# Patient Record
Sex: Male | Born: 1990 | Race: Black or African American | Hispanic: No | Marital: Single | Smoking: Current every day smoker
Health system: Southern US, Community
[De-identification: ages and names within clinical notes are randomized; demographics above are authoritative.]

## PROBLEM LIST (undated history)

## (undated) DIAGNOSIS — F209 Schizophrenia, unspecified: Secondary | ICD-10-CM

## (undated) DIAGNOSIS — F419 Anxiety disorder, unspecified: Secondary | ICD-10-CM

## (undated) DIAGNOSIS — F39 Unspecified mood [affective] disorder: Secondary | ICD-10-CM

---
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
## 2011-09-18 ENCOUNTER — Emergency Department (HOSPITAL_COMMUNITY): Payer: Self-pay

## 2011-09-18 ENCOUNTER — Emergency Department (HOSPITAL_COMMUNITY)
Admission: EM | Admit: 2011-09-18 | Discharge: 2011-09-22 | Disposition: A | Discharge status: Other Acute Inpatient Hospital | Payer: Self-pay

## 2011-09-18 ENCOUNTER — Encounter (HOSPITAL_COMMUNITY): Payer: Self-pay | Admitting: Family Medicine

## 2011-09-18 DIAGNOSIS — F29 Unspecified psychosis not due to a substance or known physiological condition: Secondary | ICD-10-CM | POA: Insufficient documentation

## 2011-09-18 DIAGNOSIS — IMO0002 Reserved for concepts with insufficient information to code with codable children: Secondary | ICD-10-CM | POA: Insufficient documentation

## 2011-09-18 LAB — RAPID URINE DRUG SCREEN, HOSP PERFORMED
Amphetamines: NOT DETECTED
Barbiturates: NOT DETECTED
Cocaine: NOT DETECTED
Tetrahydrocannabinol: POSITIVE — AB

## 2011-09-18 LAB — URINALYSIS, ROUTINE W REFLEX MICROSCOPIC
Glucose, UA: NEGATIVE mg/dL
Hgb urine dipstick: NEGATIVE
Ketones, ur: 40 mg/dL — AB
Nitrite: NEGATIVE
Protein, ur: 100 mg/dL — AB
Specific Gravity, Urine: 1.036 — ABNORMAL HIGH (ref 1.005–1.030)
Urobilinogen, UA: 1 mg/dL (ref 0.0–1.0)

## 2011-09-18 LAB — URINE MICROSCOPIC-ADD ON

## 2011-09-18 LAB — COMPREHENSIVE METABOLIC PANEL
AST: 52 U/L — ABNORMAL HIGH (ref 0–37)
Creatinine, Ser: 1.09 mg/dL (ref 0.50–1.35)
GFR calc non Af Amer: >90 mL/min (ref >90)
Glucose, Bld: 94 mg/dL (ref 70–99)

## 2011-09-18 LAB — CBC
MCH: 28.6 pg (ref 26.0–34.0)
RBC: 5.21 MIL/uL (ref 4.22–5.81)

## 2011-09-18 LAB — ACETAMINOPHEN LEVEL: Acetaminophen (Tylenol), Serum: <15 ug/mL (ref 10–30)

## 2011-09-18 MED ORDER — ZOLPIDEM TARTRATE 5 MG PO TABS: 5.0000 mg | ORAL_TABLET | Freq: Every evening | ORAL | Status: DC | PRN

## 2011-09-18 MED ORDER — ONDANSETRON HCL 4 MG PO TABS
4.0000 mg | ORAL_TABLET | Freq: Three times a day (TID) | ORAL | Status: DC | PRN
  Administered 2011-09-21: 4 mg via ORAL

## 2011-09-18 MED ORDER — IBUPROFEN 600 MG PO TABS: 600.0000 mg | ORAL_TABLET | Freq: Three times a day (TID) | ORAL | Status: DC | PRN

## 2011-09-18 MED ORDER — ACETAMINOPHEN 325 MG PO TABS: 650.0000 mg | ORAL_TABLET | ORAL | Status: DC | PRN

## 2011-09-18 MED ORDER — ALUM & MAG HYDROXIDE-SIMETH 200-200-20 MG/5ML PO SUSP: 30.0000 mL | ORAL | Status: DC | PRN

## 2011-09-18 MED ORDER — LORAZEPAM 1 MG PO TABS
1.0000 mg | ORAL_TABLET | Freq: Three times a day (TID) | ORAL | Status: DC | PRN
  Administered 2011-09-20 – 2011-09-22 (×3): 1 mg via ORAL
  Filled 2011-09-18 (×4): qty 1

## 2011-09-18 NOTE — ED Notes (Addendum)
Pt has shirt, pants, shoes,socks, wallet, bracelets, watch and some coins.   Security searched pt and belongings.  Placed in cabinet 2 triage.
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
## 2011-09-18 NOTE — ED Provider Notes (Signed)
Pt delusional . Speaking in nonsensical run on sentences. Under IVC .  Told others he was God and Mariana Kaufman the [place],. To obstain telepschy consult Pt felt to be having 1st psychotic break  Doug Sou, MD 09/18/11 2354

## 2011-09-18 NOTE — ED Notes (Signed)
Wolfe, PA at bedside.
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
## 2011-09-18 NOTE — ED Notes (Signed)
MD at bedside. 

## 2011-09-19 ENCOUNTER — Inpatient Hospital Stay (HOSPITAL_COMMUNITY): Admit: 2011-09-19 | Payer: Self-pay | Source: Other Acute Inpatient Hospital | Admitting: Pulmonary Disease

## 2011-09-19 LAB — COMPREHENSIVE METABOLIC PANEL
Albumin: 4 g/dL (ref 3.5–5.2)
Alkaline Phosphatase: 53 U/L (ref 39–117)
Calcium: 9 mg/dL (ref 8.4–10.5)
Chloride: 103 mEq/L (ref 96–112)
GFR calc Af Amer: >90 mL/min (ref >90)
GFR calc non Af Amer: >90 mL/min (ref >90)
Glucose, Bld: 94 mg/dL (ref 70–99)
Sodium: 137 mEq/L (ref 135–145)
Total Bilirubin: 1.5 mg/dL — ABNORMAL HIGH (ref 0.3–1.2)
Total Protein: 6.5 g/dL (ref 6.0–8.3)

## 2011-09-19 MED ORDER — ZIPRASIDONE MESYLATE 20 MG IM SOLR
INTRAMUSCULAR | Status: AC
  Administered 2011-09-19: 20 mg via INTRAMUSCULAR
  Filled 2011-09-19: qty 20

## 2011-09-19 MED ORDER — ACETAMINOPHEN 325 MG PO TABS: 650.0000 mg | ORAL_TABLET | ORAL | Status: DC | PRN

## 2011-09-19 MED ORDER — LORAZEPAM 1 MG PO TABS
1.0000 mg | ORAL_TABLET | Freq: Three times a day (TID) | ORAL | Status: DC | PRN
  Administered 2011-09-19 – 2011-09-21 (×2): 1 mg via ORAL
  Filled 2011-09-19: qty 1

## 2011-09-19 MED ORDER — SODIUM CHLORIDE 0.9 % IV BOLUS (SEPSIS)
1000.0000 mL | Freq: Once | INTRAVENOUS | Status: AC
  Administered 2011-09-19: 1000 mL via INTRAVENOUS

## 2011-09-19 MED ORDER — ONDANSETRON HCL 4 MG PO TABS
4.0000 mg | ORAL_TABLET | Freq: Three times a day (TID) | ORAL | Status: DC | PRN
  Filled 2011-09-19: qty 1

## 2011-09-19 MED ORDER — IBUPROFEN 600 MG PO TABS: 600.0000 mg | ORAL_TABLET | Freq: Three times a day (TID) | ORAL | Status: DC | PRN

## 2011-09-19 NOTE — ED Notes (Signed)
Obtain labs after pt has completed bolus per MD
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
## 2011-09-19 NOTE — ED Notes (Signed)
Patient ready to be transferred to [facility].  BH unable to take patient due to staffing. Patient resting quietly in bed with handcuffs on. Skin assessment ok.  Pulses good.  V/S stable.

## 2011-09-19 NOTE — ED Notes (Signed)
Remains in bed asleep at this time. No distress of any sort noted.

## 2011-09-19 NOTE — ED Notes (Signed)
Hacinta (pt. Sister) : [phone]

## 2011-09-19 NOTE — ED Provider Notes (Signed)
9:57 AM The patient became agitated in the emergency department and consulted at [facility].  He is in handcuffs at the bedside.  The rales believes that he is too dangerous for that are 400 hall.  I last her until psychiatry consultation for medication recommendations in this young 21 year old gentleman who has no prior psychiatric history he had per report has delusions.  We spoke with [facility] who is reluctant to take the patient and comes because of his significant threat to others.   Lyanne Co, MD 09/19/11 [phone]

## 2011-09-19 NOTE — ED Notes (Signed)
EDPA Judeth Cornfield called requested that labs be drawn after bolus of fluids. Spoke with Dois Davenport RN CN to move pt to the ED for IV fluids will call back when she has a bed

## 2011-09-19 NOTE — ED Notes (Signed)
Pt accompanied by police to bedside from psych ED

## 2011-09-19 NOTE — ED Notes (Signed)
Pt verbalized to Clinical research associate that he desires to have vistors at this time. Pt informed Clinical research associate that his sister may visit him later. Writer telephoned pt's sister Azalee Course to inform her of pt's request. Pt's sister stated that she would come by to visit pt later this evening.
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
## 2011-09-19 NOTE — ED Notes (Addendum)
Per [place], Pt has been accepted to [facility] to Dr. Allena Katz, Rm 400-1. EDP has been notified and agrees with plan.

## 2011-09-19 NOTE — ED Notes (Signed)
Resting quietly in bed watching television. Remains in handcuffs at this time. Capillary refill wnl.

## 2011-09-19 NOTE — ED Notes (Signed)
Pt present to tcu room 31.  Sitter at bedside.  Pt states ";ies caused me to flip out, my baby moma lied to me, she sleeping around"  Pt denies SI and HI at this time

## 2011-09-19 NOTE — ED Notes (Addendum)
Tele psych consult faxed and called into [facility] --re faxed information at [phone] per EDP [facility]

## 2011-09-19 NOTE — ED Notes (Signed)
Pt is asleep at this time. In no distress at this time. resp even and unlabored. Sitter remains at bedside for safety.

## 2011-09-19 NOTE — ED Notes (Signed)
Hand cuffs assessed.  Patient has sufficient mobility in hand cuffs.  Cap refill less than 2 seconds.  Pulses +2 in BUE.  Patient not showing any signs of distress at this time.

## 2011-09-19 NOTE — ED Provider Notes (Signed)
Medical screening examination/treatment/procedure(s) were conducted as a shared visit with non-physician practitioner(s) and myself.  I personally evaluated the patient during the encounter  Doug Sou, MD 09/19/11 1455

## 2011-09-20 MED ORDER — RISPERIDONE 1 MG PO TABS
1.0000 mg | ORAL_TABLET | Freq: Two times a day (BID) | ORAL | Status: DC
  Administered 2011-09-20 – 2011-09-22 (×4): 1 mg via ORAL
  Filled 2011-09-20 (×4): qty 1

## 2011-09-20 MED ORDER — ZIPRASIDONE MESYLATE 20 MG IM SOLR
20.0000 mg | Freq: Once | INTRAMUSCULAR | Status: AC
  Administered 2011-09-20: 20 mg via INTRAMUSCULAR
  Filled 2011-09-20: qty 20

## 2011-09-20 NOTE — ED Notes (Signed)
Pt up to the bathroom, initially in there without the light on, he's now turned the light on and opened the door and is just messing with different things in the bathroom.
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
## 2011-09-20 NOTE — ED Provider Notes (Signed)
The patient Is calm and comfortable this morning. He denies any specific complaints.  Filed Vitals:   09/20/11 0604  BP: 142/93  Pulse: 108  Temp: 98 F (36.7 C)  Resp: 16    Heart: Regular rate and rhythm normal S1-S2 Lungs: Clear to auscultation  The plan is for psychiatric admission. We will continue to monitor closely  Celene Kras, MD 09/20/11 [phone]

## 2011-09-20 NOTE — ED Notes (Signed)
Pt has been up walking the halls with a blanket around him including his head but not covering his face. He went into the activity room and sat down stating that was his room. He did redirect out of the room then went in both bathrooms for a little bit with the light off the whole time. Pt then went in room 34 and laid down on the bed and had to be redirected back to his room 35. TV was turned on for the pt.

## 2011-09-20 NOTE — ED Notes (Signed)
Called SOC call center again to see if the psychiatrist was going to talk with pt gave information x2 awaiting SOC to contact

## 2011-09-21 DIAGNOSIS — F29 Unspecified psychosis not due to a substance or known physiological condition: Secondary | ICD-10-CM

## 2011-09-21 MED ORDER — ZIPRASIDONE MESYLATE 20 MG IM SOLR
10.0000 mg | Freq: Once | INTRAMUSCULAR | Status: AC
  Administered 2011-09-21: 10 mg via INTRAMUSCULAR
  Filled 2011-09-21: qty 20

## 2011-09-21 MED ORDER — DIVALPROEX SODIUM ER 500 MG PO TB24
500.0000 mg | ORAL_TABLET | Freq: Two times a day (BID) | ORAL | Status: DC
  Administered 2011-09-21 – 2011-09-22 (×2): 500 mg via ORAL
  Filled 2011-09-21 (×5): qty 1

## 2011-09-21 NOTE — ED Notes (Signed)
Pt information has been sent to [facility] and CSW has asked that pt be placed on the priority list. Pt's [place] authorization#: [id number] for 7 days starting on 09/21/11.

## 2011-09-21 NOTE — ED Notes (Signed)
Pt up to desk requesting clothing to leave, pt advised that he is under IVC and he can not leave until he is d/c by MD, pt then starts to press code blue button continuously and required redirection by Clinical research associate, [employer]/security.
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
## 2011-09-21 NOTE — Discharge Planning (Signed)
CSW requested telepsych for med recommendations.  Pending return call.  Manson Passey Ebelyn Bohnet ANN S , MSW, LCSWA 09/21/2011 11:13 AM [phone]

## 2011-09-21 NOTE — ED Notes (Signed)
Is continuing to turn on the call lite in BR and in his room, at least 6 time he turned these lights on, was informed of the camera in his room, was encouraged not to turn the lights on, explained to him what the lights were for, call in to MD for IM meds.

## 2011-09-21 NOTE — Consult Note (Signed)
Reason for Consult:psychosis Referring Physician: ED physician  Cody [name] is an 21 y.o. male.  HPI: This is a young african [place] male presented with new onset psychosis, disorganized and bizarre behaviors, running behind vehicle, identifying himself with pop singers or television characters and unable to function in school and in family. He has mood swings, grandiose, irritable and occasionally aggravated. He has received Geodon intramuscular medications as needed which helped to be calm and risperidone oral medication since been in ER. He has no previous medication treatment. He has smoked marijuana and believes that gives energy for him.   History reviewed. No pertinent past medical history.  History reviewed. No pertinent past surgical history.  History reviewed. No pertinent family history.  Social History:  reports that he has never smoked. He does not have any smokeless tobacco history on file. He reports that he uses illicit drugs (Marijuana). He reports that he does not drink alcohol.  Allergies: No Known Allergies  Medications: I have reviewed the patient's current medications.  No results found for this or any previous visit (from the past 48 hour(s)).  No results found.  No depression, No anxiety and Positive for bipolar, mood swings, school difficulties and sleep disturbance Blood pressure 119/77, pulse 53, temperature 98.4 F (36.9 C), temperature source Oral, resp. rate 20, weight 138 lb 2 oz (62.653 kg), SpO2 98.00%.   Assessment/Plan: Psychosis NOS Bipolar disorder NOS Cannabis abuse  Recommend mood stabilizer Depakote to control mood swings, irritability and aggravation Will continue Risperidal 1mg  PO BID and may titrate if needed He will benefit from In patient hospitalization Case discussed with ER physician and staff nurses and team.   Nehemiah Settle. 09/21/2011, 3:28 PM

## 2011-09-21 NOTE — ED Notes (Signed)
MD in to visit, patient ran to [facility], turned on call light, when this nurse went into BR patient appeared not to be there, but then the shower started running, patient was sitting in the shower w/water running over him in his scrubs, states he wanted to wake up so he got in the shower, went back to his room to visit w/MD. Cooperative.

## 2011-09-21 NOTE — ED Notes (Signed)
CSW spoke with Renee Pain at [facility] who stated that pt has been added to the wait list and is "tentatively on the priority list". No further information required by [facility] at this time.

## 2011-09-21 NOTE — ED Notes (Signed)
[employer] Officer as well as 2 Tax adviser present in room during conversation w/nurse

## 2011-09-22 MED ORDER — HALOPERIDOL LACTATE 5 MG/ML IJ SOLN
10.0000 mg | Freq: Once | INTRAMUSCULAR | Status: AC
  Administered 2011-09-22: 10 mg via INTRAMUSCULAR
  Filled 2011-09-22: qty 2

## 2011-09-22 MED ORDER — ZIPRASIDONE MESYLATE 20 MG IM SOLR
20.0000 mg | Freq: Once | INTRAMUSCULAR | Status: AC
  Administered 2011-09-22: 20 mg via INTRAMUSCULAR

## 2011-09-22 MED ORDER — ZIPRASIDONE MESYLATE 20 MG IM SOLR
INTRAMUSCULAR | Status: AC
  Filled 2011-09-22: qty 20

## 2011-09-22 NOTE — ED Notes (Addendum)
Moving bed from room into the hallway and lying in the bed, patient asked for cheese, nurse stated he could have the cheese if he left the lights alone, stated he would, appears anxious will medicate

## 2011-09-22 NOTE — Discharge Instructions (Signed)
Psychosis  Psychosis refers to a severe lack of understanding with reality. During a psychotic episode, you are not able to think clearly. During a psychotic episode, your responses and emotions are inappropriate and do not coincide with what is actually happening. You often have false beliefs about what is happening or who you are (delusions), and you may see, hear, taste, smell, or feel things that are not present (hallucinations). Psychosis is usually a severe symptom of a very serious mental health (psychiatric) condition, but it can sometimes be the result of a medical condition.  CAUSES    Psychiatric conditions, such as:   Schizophrenia.   Bipolar disorder.   Depression.   Personality disorders.   Alcohol or drug abuse.   Medical conditions, such as:   Brain injury.   Brain tumor.   Dementia.   Brain diseases, such as Alzheimer's, Parkinson's, or Huntington's disease.   Neurological diseases, such as epilepsy.   Genetic disorders.   Metabolic disorders.   Infections that affect the brain.   Certain prescription drugs.   Stroke.  SYMPTOMS    Unable to think or speak clearly or respond appropriately.   Disorganized thinking (thoughts jump from one thought to another).   Severe inappropriate behavior.   Delusions may include:   A strong belief that is odd, unrealistic, or false.   Feeling extremely fearful or suspicious (paranoid).   Believing you are someone else, have high importance, or have an altered identity.   Hallucinations.  DIAGNOSIS    Mental health evaluation.   Physical exam.   Blood tests.   Computerized magnetic scan (MRI) or other brain scans.  TREATMENT   Your caregiver will recommend a course of treatment that depends on the cause of the psychosis.  Treatment may include:   Monitoring and supportive care in the hospital.   Taking medicines (antipsychotic medicine) to reduce symptoms and balance chemicals in the brain.   Taking medicines to manage underlying  mental health conditions.   Therapy and other supportive programs outside of the hospital.   Treating an underlying medical condition.  If the cause of the psychosis can be treated or corrected, the outlook is good. Without treatment, psychotic episodes can cause danger to yourself or others. Treatment may be short-term or lifelong.  HOME CARE INSTRUCTIONS    Take all medicines as directed. This is important.   Use a pillbox or write down your medicine schedule to make sure you are taking them.   Check with your caregiver before using over-the-counter medicines, herbs, or supplements.   Seek individual and family support through therapy and mental health education (psychoeducation) programs. These will help you manage symptoms and side effects of medicines, learn life skills, and maintain a healthy routine.   Maintain a healthy lifestyle.   Exercise regularly.   Avoid alcohol and drugs.   Learn ways to reduce stress and cope with stress, such as yoga and meditation.   Talk about your feelings with family members or caregivers.   Make time for yourself to do things you enjoy.   Know the early warning signs of psychosis. Your caregiver will recommend steps to take when you notice symptoms such as:   Feeling anxious or preoccupied.   Having racing thoughts.   Changes in your interest in life and relationships.   Follow up with your caregivers for continued outpatient treatment as directed.  SEEK MEDICAL CARE IF:    Medicines do not seem to be helping.   You hear   voices telling you to do things.   You see, smell, or feel things that are not there.   You feel hopeless and overwhelmed.   You feel extremely fearful and suspicious that something will harm you.   You feel like you cannot leave your house.   You have trouble taking care of yourself.   You experience side effects of medicines, such as changes in sleep patterns, dizziness, weight gain, restlessness, movement changes, muscle spasms, or  tremors.  SEEK IMMEDIATE MEDICAL CARE IF:   Severe psychotic symptoms present a safety issue (such as an urge to hurt yourself or others).  MAKE SURE YOU:    Understand these instructions.   Will watch your condition.   Will get help right away if you are not doing well or get worse.  FOR MORE INFORMATION   National Institute of Mental Health: www.nimh.nih.gov  Document Released: 11/19/2009 Document Revised: 05/21/2011 Document Reviewed: 11/19/2009  ExitCare Patient Information 2012 [employer].
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
## 2011-09-22 NOTE — BHH Counselor (Signed)
Patient officially accepted to [facility], per Junious Dresser. Dr. Sherren Mocha notified of patients disposition. Patients nurse-Sheila will call sheriff for transport.

## 2011-09-22 NOTE — Consult Note (Signed)
Reason for Consult:Psychosis NOS Referring Physician: Dr. Sherri Sear is an 21 y.o. male.  HPI: Patient was seen and spoke with his mother who visiting him. He has been cooperative with staff and medications. He has continues to be manic, paranoid, delusional and occasional disorganized behavioral problems.  History reviewed. No pertinent past medical history.  History reviewed. No pertinent past surgical history.  History reviewed. No pertinent family history.  Social History:  reports that he has never smoked. He does not have any smokeless tobacco history on file. He reports that he uses illicit drugs (Marijuana). He reports that he does not drink alcohol.  Allergies: No Known Allergies  Medications: I have reviewed the patient's current medications.  No results found for this or any previous visit (from the past 48 hour(s)).  No results found.  No depression, No anxiety and Positive for bipolar, mood swings and sleep disturbance Blood pressure 137/89, pulse 116, temperature 97.9 F (36.6 C), temperature source Oral, resp. rate 18, weight 138 lb 2 oz (62.653 kg), SpO2 100.00%.   Assessment/Plan: Psychosis NOS  Continue treatment plan Patient was accepted at [facility] and will be transferred today  Bianca Raneri,JANARDHAHA R. 09/22/2011, 5:14 PM
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
## 2011-09-22 NOTE — ED Notes (Signed)
Pt out of room completely undressed, requesting new scrubs, reported he threw his scrubs in the trash can, pt given new scrubs and instructed that in the future if he needs new scrubs he needs to come and ask for them before he takes his old ones off and walks out in the hall naked.

## 2011-09-22 NOTE — ED Notes (Signed)
Pt up wandering, continues to attempt to go into other pt's room/get into the ACT/SW room, wandering into the other rooms, removing the remote controls from the rooms and rearranging the furniture, pulled IV pole from bed and took into another room, security/[employer] called to unit to remove pole/redirect pt back to his room, pt in room for short time then back to desk, reaching in window and pulling e sig pad through to write on it, went into other pt's room and needed to be removed. Pt constantly wandering and needs constant redirection for behavior.

## 2011-09-22 NOTE — ED Notes (Signed)
Pt up out of room, continues to wander into other pt's rooms and rearranging furniture, pt would not leave out of pt's room, pressed call buttons and required show of support by security before he would go back into his room.

## 2011-09-22 NOTE — ED Provider Notes (Addendum)
Patient disrupting care in the psych ED at this time. Hemodynamically stable. Patient given Geodon 20 mg IM. The last dose was at 8:15 last night. 10 mg was given at that time and patient required an additional dose of 10 mg of Haldol. Patient awaiting placement.  Filed Vitals:   09/22/11 0604  BP: 127/80  Pulse: 110  Temp: 98.6 F (37 C)  Resp: 22    2:15 PM Accepted by Dr. Junious Dresser at [facility]. Patient remained stable. Awaiting  transport. Cyndra Numbers, MD  5:05 PM Filed Vitals:   09/22/11 1214  BP: 137/89  Pulse: 116  Temp: 97.9 F (36.6 C)  Resp: 18    [facility] to be [facility]. 09/22/11 1110  Cyndra Numbers, MD 09/22/11 1415  Cyndra Numbers, MD 09/22/11 1706

## 2011-09-22 NOTE — BHH Counselor (Signed)
Received call from Allison [place] at [facility] stating that patient's bed is available. She is requesting updated vitals and MAR. Writer gathered items and faxed information to [facility] for them to review and officially accept patient.   Writer informed patients nurse-Sheila with patients updated disposition.

## 2011-10-12 ENCOUNTER — Encounter (HOSPITAL_COMMUNITY): Payer: Self-pay | Admitting: *Deleted

## 2011-10-12 ENCOUNTER — Emergency Department (HOSPITAL_COMMUNITY)
Admission: EM | Admit: 2011-10-12 | Discharge: 2011-10-12 | Disposition: A | Discharge status: Home / Self Care | Payer: Self-pay | Attending: Emergency Medicine | Admitting: Emergency Medicine

## 2011-10-12 DIAGNOSIS — F29 Unspecified psychosis not due to a substance or known physiological condition: Secondary | ICD-10-CM | POA: Insufficient documentation

## 2011-10-12 DIAGNOSIS — F319 Bipolar disorder, unspecified: Secondary | ICD-10-CM | POA: Insufficient documentation

## 2011-10-12 HISTORY — DX: Unspecified mood (affective) disorder: F39

## 2011-10-12 LAB — COMPREHENSIVE METABOLIC PANEL
AST: 20 U/L (ref 0–37)
CO2: 30 mEq/L (ref 19–32)
Calcium: 9.4 mg/dL (ref 8.4–10.5)
Chloride: 102 mEq/L (ref 96–112)
Creatinine, Ser: 0.87 mg/dL (ref 0.50–1.35)
GFR calc Af Amer: >90 mL/min (ref >90)
GFR calc non Af Amer: >90 mL/min (ref >90)
Glucose, Bld: 94 mg/dL (ref 70–99)
Total Bilirubin: 0.9 mg/dL (ref 0.3–1.2)

## 2011-10-12 LAB — CBC
HCT: 38.7 % — ABNORMAL LOW (ref 39.0–52.0)
Hemoglobin: 12.8 g/dL — ABNORMAL LOW (ref 13.0–17.0)
MCH: 28.3 pg (ref 26.0–34.0)
MCV: 85.6 fL (ref 78.0–100.0)
Platelets: 170 10*3/uL (ref 150–400)
RBC: 4.52 MIL/uL (ref 4.22–5.81)
WBC: 7.7 10*3/uL (ref 4.0–10.5)

## 2011-10-12 LAB — RAPID URINE DRUG SCREEN, HOSP PERFORMED
Amphetamines: NOT DETECTED
Tetrahydrocannabinol: POSITIVE — AB

## 2011-10-12 LAB — URINALYSIS, ROUTINE W REFLEX MICROSCOPIC
Ketones, ur: NEGATIVE mg/dL
Leukocytes, UA: NEGATIVE
Nitrite: NEGATIVE
Protein, ur: NEGATIVE mg/dL
pH: 8 (ref 5.0–8.0)

## 2011-10-12 LAB — ACETAMINOPHEN LEVEL: Acetaminophen (Tylenol), Serum: <15 ug/mL (ref 10–30)

## 2011-10-12 LAB — VALPROIC ACID LEVEL: Valproic Acid Lvl: 52.3 ug/mL (ref 50.0–100.0)

## 2011-10-12 NOTE — Discharge Instructions (Signed)
Go back to [facility]

## 2011-10-12 NOTE — ED Notes (Signed)
Pt presents from [facility] with IVC papers. Pt here for medical clearance and is to return to [facility] after clearance.

## 2011-10-12 NOTE — ED Provider Notes (Signed)
History     CSN: [id number]  Arrival date & time 10/12/11  1329   First MD Initiated Contact with Patient 10/12/11 1506      Chief Complaint  Patient presents with  . Medical Clearance   level V caveat due to altered mental status.  (Consider location/radiation/quality/duration/timing/severity/associated sxs/prior treatment) The history is provided by the patient.   patient was sent in from [place] after being IVC. He states he is here because he needs he needs some trees a no pertinent past medical history some trees. He is a history of bipolar disorder and was recently inpatient at [facility]. He reportedly has a bed back at [facility] after medical clearance. Past Medical History  Diagnosis Date  . Bipolar 1 disorder   . Mood disorder     History reviewed. No pertinent past surgical history.  History reviewed. No pertinent family history.  History  Substance Use Topics  . Smoking status: Never Smoker   . Smokeless tobacco: Not on file  . Alcohol Use: Yes      Review of Systems  Unable to perform ROS: Psychiatric disorder    Allergies  Review of patient's allergies indicates no known allergies.  Home Medications  No current outpatient prescriptions on file.  BP 125/78  Pulse 98  Temp(Src) 99.1 F (37.3 C) (Oral)  Resp 20  SpO2 99%  Physical Exam  Nursing note and vitals reviewed. Constitutional: He appears well-developed and well-nourished.  HENT:  Head: Normocephalic and atraumatic.  Eyes: EOM are normal. Pupils are equal, round, and reactive to light.  Neck: Normal range of motion. Neck supple.  Cardiovascular: Normal rate, regular rhythm and normal heart sounds.   No murmur heard. Pulmonary/Chest: Effort normal and breath sounds normal.  Abdominal: Soft. Bowel sounds are normal. He exhibits no distension and no mass. There is no tenderness. There is no rebound and no guarding.  Musculoskeletal: Normal range of motion. He exhibits no  edema.  Neurological: He is alert. No cranial nerve deficit.       Patient is confused and says that all he needs is some trees.  Skin: Skin is warm and dry.  Psychiatric:       Patient has abnormal thought content. He states he's been bad forever. He states he just needs some trees    ED Course  Procedures (including critical care time)  Labs Reviewed  CBC - Abnormal; Notable for the following:    Hemoglobin 12.8 (*)    HCT 38.7 (*)    All other components within normal limits  URINE RAPID DRUG SCREEN (HOSP PERFORMED) - Abnormal; Notable for the following:    Tetrahydrocannabinol POSITIVE (*)    All other components within normal limits  COMPREHENSIVE METABOLIC PANEL  ETHANOL  ACETAMINOPHEN LEVEL  VALPROIC ACID LEVEL  URINALYSIS, ROUTINE W REFLEX MICROSCOPIC   No results found.   1. Bipolar disorder     Date: 10/12/2011  Rate: 90  Rhythm: normal sinus rhythm  QRS Axis: normal  Intervals: normal  ST/T Wave abnormalities: normal  Conduction Disutrbances:none  Narrative Interpretation:   Old EKG Reviewed: unchanged     MDM  Patient was sent from [facility] for labwork. Lab work is done and the patient was sent back.        Juliet Rude. Rubin Payor, MD 10/12/11 [phone]

## 2013-05-13 ENCOUNTER — Encounter (HOSPITAL_COMMUNITY): Payer: Self-pay | Admitting: Emergency Medicine

## 2013-05-13 ENCOUNTER — Emergency Department (HOSPITAL_COMMUNITY)
Admission: EM | Admit: 2013-05-13 | Discharge: 2013-05-14 | Disposition: A | Discharge status: Home / Self Care | Payer: Self-pay | Attending: Emergency Medicine | Admitting: Emergency Medicine

## 2013-05-13 DIAGNOSIS — F39 Unspecified mood [affective] disorder: Secondary | ICD-10-CM | POA: Insufficient documentation

## 2013-05-13 DIAGNOSIS — F1510 Other stimulant abuse, uncomplicated: Secondary | ICD-10-CM | POA: Insufficient documentation

## 2013-05-13 DIAGNOSIS — F1910 Other psychoactive substance abuse, uncomplicated: Secondary | ICD-10-CM

## 2013-05-13 LAB — CBC
HCT: 41.1 % (ref 39.0–52.0)
Platelets: 173 10*3/uL (ref 150–400)
RBC: 4.93 MIL/uL (ref 4.22–5.81)
RDW: 12.6 % (ref 11.5–15.5)
WBC: 12 10*3/uL — ABNORMAL HIGH (ref 4.0–10.5)

## 2013-05-13 LAB — COMPREHENSIVE METABOLIC PANEL
ALT: 24 U/L (ref 0–53)
AST: 78 U/L — ABNORMAL HIGH (ref 0–37)
Albumin: 4.6 g/dL (ref 3.5–5.2)
Alkaline Phosphatase: 61 U/L (ref 39–117)
BUN: 11 mg/dL (ref 6–23)
CO2: 23 mEq/L (ref 19–32)
Calcium: 10 mg/dL (ref 8.4–10.5)
Chloride: 100 mEq/L (ref 96–112)
GFR calc Af Amer: >90 mL/min (ref >90)
Glucose, Bld: 95 mg/dL (ref 70–99)
Potassium: 3.6 mEq/L (ref 3.5–5.1)
Sodium: 135 mEq/L (ref 135–145)
Total Bilirubin: 1.6 mg/dL — ABNORMAL HIGH (ref 0.3–1.2)

## 2013-05-13 LAB — SALICYLATE LEVEL: Salicylate Lvl: <2 mg/dL — ABNORMAL LOW (ref 2.8–20.0)

## 2013-05-13 LAB — ACETAMINOPHEN LEVEL: Acetaminophen (Tylenol), Serum: <15 ug/mL (ref 10–30)

## 2013-05-13 NOTE — ED Notes (Signed)
Pt states that he has been unable to sleep. Per [employer], pt was acting erratically at a convenience store.
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
## 2013-05-14 NOTE — ED Notes (Signed)
Pt states he was leaving department, pt ambulatory with steady gait. Number given by pt for step mother is no longer in service.

## 2013-05-14 NOTE — ED Notes (Signed)
Pt walked from the department, pt is voluntary at this time.
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
## 2013-05-14 NOTE — ED Notes (Signed)
Pt states he spoke with girlfriend, she is enroute to get him. Pt walking around room, small tan chunks of material noted on floor. Pt states he vomited on floor. Pt advised he can be d/c when girlfriend arrives
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
## 2013-05-14 NOTE — ED Notes (Signed)
Pt unable to give girlfriends name or number

## 2013-05-14 NOTE — ED Notes (Signed)
Call returned from Cody [name], she is unable to locate family at this time.

## 2013-05-15 ENCOUNTER — Encounter (HOSPITAL_COMMUNITY): Payer: Self-pay | Admitting: Emergency Medicine

## 2013-05-15 ENCOUNTER — Emergency Department (HOSPITAL_COMMUNITY)
Admission: EM | Admit: 2013-05-15 | Discharge: 2013-05-16 | Disposition: A | Discharge status: Home / Self Care | Payer: Self-pay | Attending: Emergency Medicine | Admitting: Emergency Medicine

## 2013-05-15 DIAGNOSIS — F29 Unspecified psychosis not due to a substance or known physiological condition: Secondary | ICD-10-CM | POA: Insufficient documentation

## 2013-05-15 DIAGNOSIS — IMO0002 Reserved for concepts with insufficient information to code with codable children: Secondary | ICD-10-CM | POA: Insufficient documentation

## 2013-05-15 DIAGNOSIS — F319 Bipolar disorder, unspecified: Secondary | ICD-10-CM | POA: Insufficient documentation

## 2013-05-15 DIAGNOSIS — Z9119 Patient's noncompliance with other medical treatment and regimen: Secondary | ICD-10-CM | POA: Insufficient documentation

## 2013-05-15 DIAGNOSIS — Z91199 Patient's noncompliance with other medical treatment and regimen due to unspecified reason: Secondary | ICD-10-CM | POA: Insufficient documentation

## 2013-05-15 DIAGNOSIS — Z79899 Other long term (current) drug therapy: Secondary | ICD-10-CM | POA: Insufficient documentation

## 2013-05-15 LAB — RAPID URINE DRUG SCREEN, HOSP PERFORMED
Amphetamines: NOT DETECTED
Opiates: NOT DETECTED
Tetrahydrocannabinol: POSITIVE — AB

## 2013-05-15 LAB — COMPREHENSIVE METABOLIC PANEL
AST: 67 U/L — ABNORMAL HIGH (ref 0–37)
Albumin: 4.4 g/dL (ref 3.5–5.2)
Alkaline Phosphatase: 62 U/L (ref 39–117)
BUN: 11 mg/dL (ref 6–23)
Creatinine, Ser: 0.99 mg/dL (ref 0.50–1.35)
GFR calc Af Amer: >90 mL/min (ref >90)
Glucose, Bld: 76 mg/dL (ref 70–99)
Potassium: 2.9 mEq/L — ABNORMAL LOW (ref 3.5–5.1)
Total Protein: 7.1 g/dL (ref 6.0–8.3)

## 2013-05-15 LAB — CBC
HCT: 39.5 % (ref 39.0–52.0)
Hemoglobin: 13.6 g/dL (ref 13.0–17.0)
MCH: 28.6 pg (ref 26.0–34.0)
MCHC: 34.4 g/dL (ref 30.0–36.0)
MCV: 83.2 fL (ref 78.0–100.0)
Platelets: 176 10*3/uL (ref 150–400)
WBC: 6.7 10*3/uL (ref 4.0–10.5)

## 2013-05-15 LAB — ETHANOL: Alcohol, Ethyl (B): <11 mg/dL (ref 0–11)

## 2013-05-15 MED ORDER — DIPHENHYDRAMINE HCL 50 MG/ML IJ SOLN
50.0000 mg | Freq: Once | INTRAMUSCULAR | Status: AC
  Administered 2013-05-15: 50 mg via INTRAMUSCULAR
  Filled 2013-05-15: qty 1

## 2013-05-15 MED ORDER — POTASSIUM CHLORIDE 20 MEQ/15ML (10%) PO LIQD
40.0000 meq | Freq: Once | ORAL | Status: AC
  Administered 2013-05-15: 40 meq via ORAL
  Filled 2013-05-15: qty 30

## 2013-05-15 MED ORDER — LORAZEPAM 2 MG/ML IJ SOLN
2.0000 mg | Freq: Once | INTRAMUSCULAR | Status: AC
  Administered 2013-05-15: 2 mg via INTRAMUSCULAR
  Filled 2013-05-15: qty 1

## 2013-05-15 MED ORDER — ZIPRASIDONE MESYLATE 20 MG IM SOLR
20.0000 mg | Freq: Once | INTRAMUSCULAR | Status: AC
  Administered 2013-05-15: 20 mg via INTRAMUSCULAR
  Filled 2013-05-15: qty 20

## 2013-05-15 NOTE — ED Notes (Signed)
Patient bed side table was removed,his bed frame and his garbage can and security and gpd is at bedside

## 2013-05-15 NOTE — ED Notes (Signed)
patient is sleeping

## 2013-05-15 NOTE — ED Notes (Signed)
Patient ringing emergency bell and he was sitting on top of the cabinets in the room
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
## 2013-05-15 NOTE — ED Notes (Signed)
Patient is refusing to get off floor he said he wants to check and go home he just closed his door and is knocking on the door from the inside

## 2013-05-15 NOTE — BH Assessment (Signed)
[facility] Assessment Progress Note      Called Dr Wilkie Aye to obtain clinical information prior to assessing the patient.  Was informed that she has not seent hese patients, but information is available in the note which has been read by TTS.  Pt will be assessed shortly.

## 2013-05-15 NOTE — ED Notes (Signed)
Patient removed linen and his mattress and laid on bed frame covered up then got up took his food out of garbage and put his garbage on bed
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
## 2013-05-16 ENCOUNTER — Encounter (HOSPITAL_COMMUNITY): Payer: Self-pay | Admitting: Registered Nurse

## 2013-05-16 DIAGNOSIS — F29 Unspecified psychosis not due to a substance or known physiological condition: Secondary | ICD-10-CM

## 2013-05-16 MED ORDER — RISPERIDONE 2 MG PO TABS
2.0000 mg | ORAL_TABLET | Freq: Two times a day (BID) | ORAL | Status: DC
  Administered 2013-05-16: 2 mg via ORAL
  Filled 2013-05-16: qty 1

## 2013-05-16 MED ORDER — NAPROXEN 500 MG PO TABS: 250.0000 mg | ORAL_TABLET | Freq: Two times a day (BID) | ORAL | Status: DC

## 2013-05-16 MED ORDER — CLONAZEPAM 1 MG PO TABS: 1.0000 mg | ORAL_TABLET | Freq: Three times a day (TID) | ORAL | Status: DC | PRN

## 2013-05-16 NOTE — ED Provider Notes (Signed)
Per psych IVC d/c and D/C patient.  Gerhard Munch, MD 05/16/13 1215

## 2013-05-16 NOTE — Consult Note (Signed)
Note reviewed and agreed with  

## 2013-05-16 NOTE — ED Notes (Addendum)
Pt took his medication without difficulty. Pt had head inside pillow case when this nurse came to administer medication. It was explained  He cannot have head inside pillow case. Pt said he is doing it for the light in his eyes not to hurt himself. He said if we could turn the lights off more he would not want to put anything over his eyes. The pt was instructed not to put his head in the pillow case anymore.

## 2013-05-16 NOTE — ED Notes (Addendum)
Pt in bathroom clothed leaning over toilet  pulling all of toilet paper off roll and stuffing it in toilet.  Pt states he wasn't going to put enough in to clog toilet. Pt redirected back to room. Pt calm and cooperative.

## 2013-05-16 NOTE — ED Notes (Signed)
Disposition notes from 11:10 to 11:27 charted on wrong patient.
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
## 2013-05-16 NOTE — Progress Notes (Signed)
Nurses report that the patient was agitated earlier in the evening and had to get geodon.  When I attempted to assess the patient he was too drowsy from the Geodon.  Writer informed the [facility] Office and the ER MD that the patient will be assessed in the morning when he awakes.

## 2013-05-16 NOTE — ED Notes (Signed)
Pt requesting graham crackers and peanut butter. Pt pleasant and cooperative.

## 2013-05-16 NOTE — ED Notes (Signed)
Pt laying in bed covered up with blanket resting.

## 2013-05-16 NOTE — Progress Notes (Signed)
[facility] provided pt with a [facility] Orange Card application, highlighting [facility].  °

## 2013-05-16 NOTE — Progress Notes (Addendum)
Writer consulted with the Psychiatrist (Dr. Ladona Ridgel) and the NP [facility]) regarding the patient not meeting criteria for inpatient hospitalization.    Dr. Ladona Ridgel rescinded the IVC.  Writer faxed the IVC papers to [employer].    Writer spoke to the patient mother Kennith Center [phone]) and she will be picking up the patient at 5pm today.  Patient was written a prescription for Risperdal 2 mg Bid.  Writer gave the patient referrals for follow up medication management and outpatient therapy at [facility].     Writer informed the nurse working with the patient.  The nurse working with the patient reports that he will contact the ER MD regarding the patients discharge.
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
## 2013-05-16 NOTE — ED Notes (Signed)
Pt pushed code blue button in room. Pt not in any distress.
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
## 2013-05-16 NOTE — ED Notes (Signed)
[name] called to inform staff she would not be able to pick up pt until tomorrow afternoon because she does not have a caretaker for the pt. She said she would not be able to provide a caretaker until tomorrow afternoon. TTs notified.  [name] phone number [phone]

## 2013-10-06 IMAGING — CT CT HEAD W/O CM
2 series · 16 of 30 positions shown, 20 images · non-contrast
Comparison: 07/21/2006

CLINICAL DATA: Psychosis, altered mental status

CT HEAD WITHOUT CONTRAST
TECHNIQUE: Contiguous axial images were obtained from the base of
the skull through the vertex without contrast.

[Series 2: head w/o · axial · non-contrast · 0.43mm/px · z∈[-156,-36]mm · 13 of 28 slices shown, 17 images]
[im 2/28  brain]
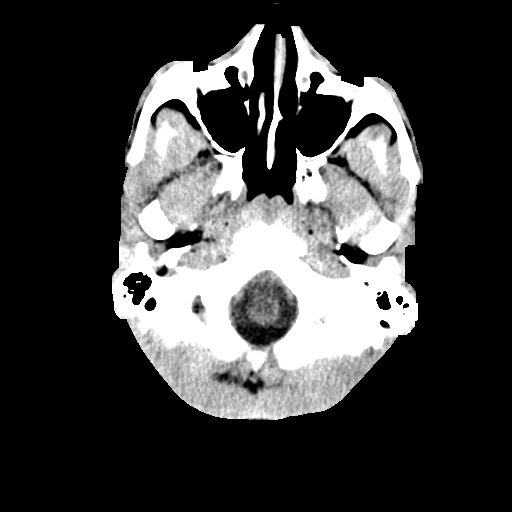
[im 2/28  bone]
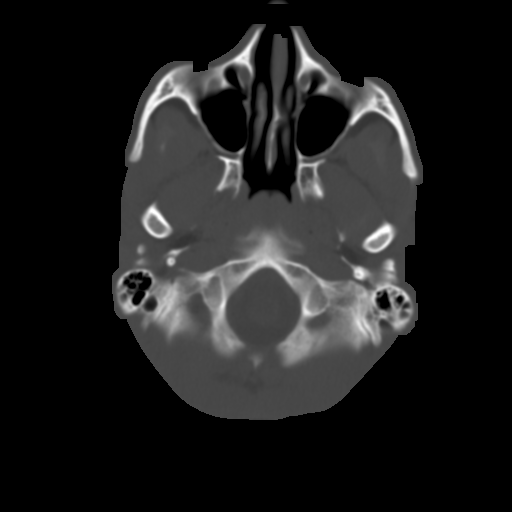
[im 4/28  brain]
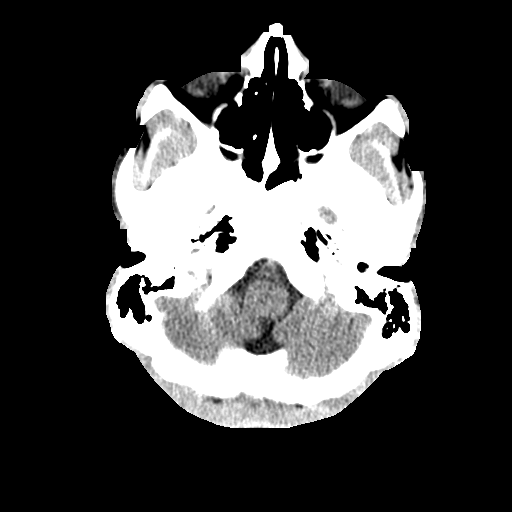
[im 6/28  brain]
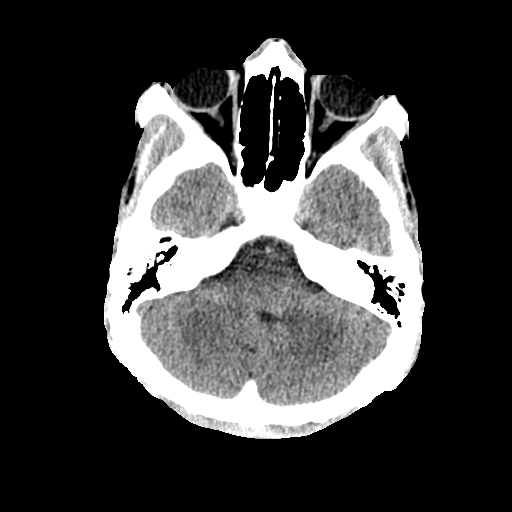
[im 8/28  brain]
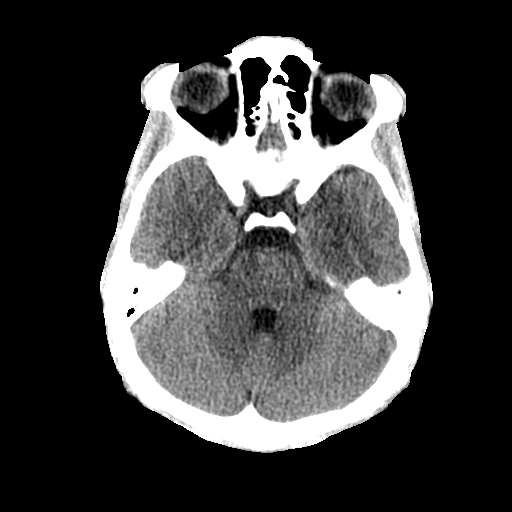
[im 10/28  brain]
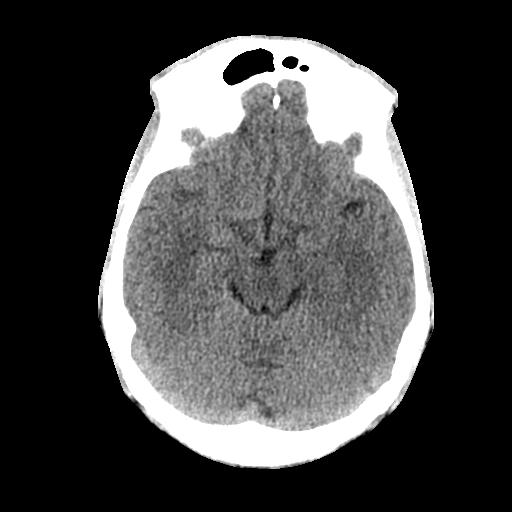
[im 10/28  bone]
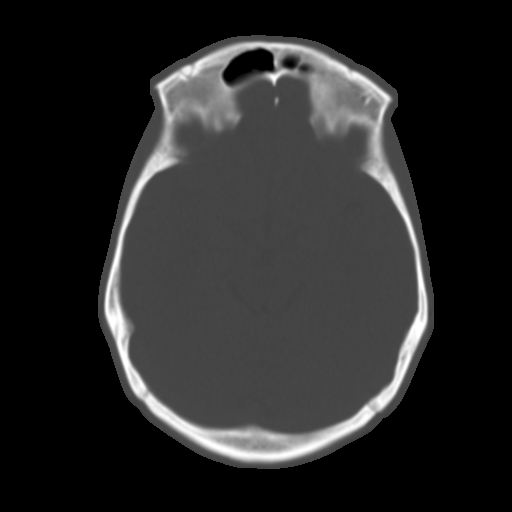
[im 12/28  brain]
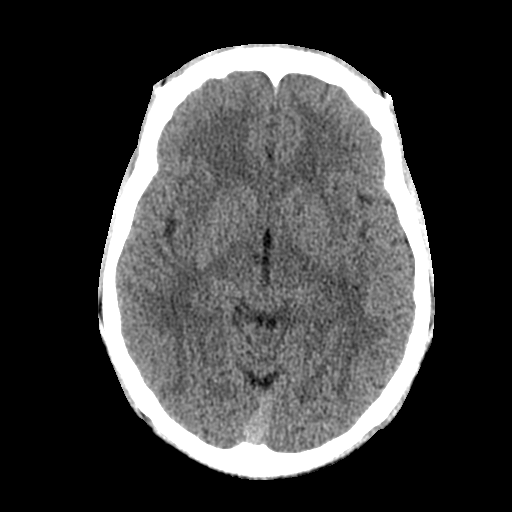
[im 14/28  brain]
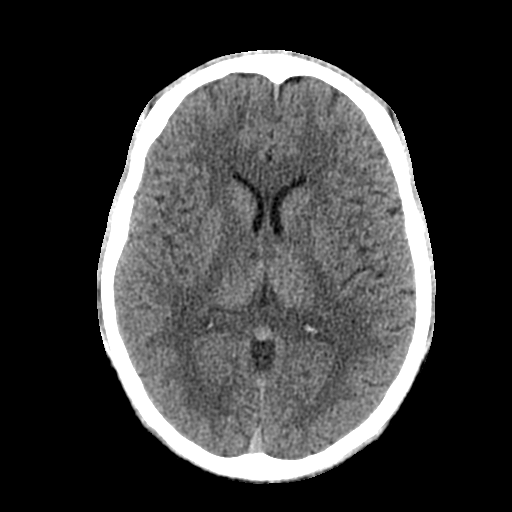
[im 16/28  brain]
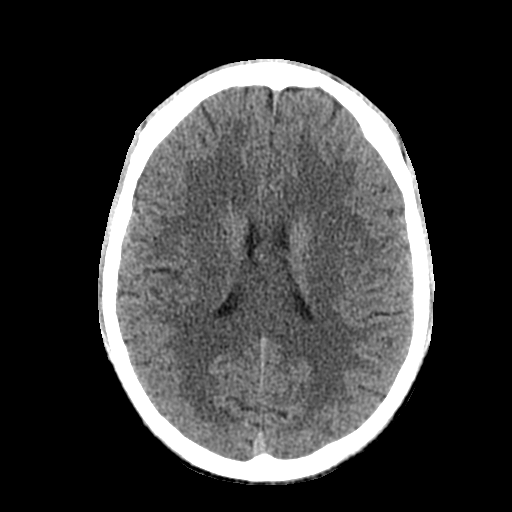
[im 18/28  brain]
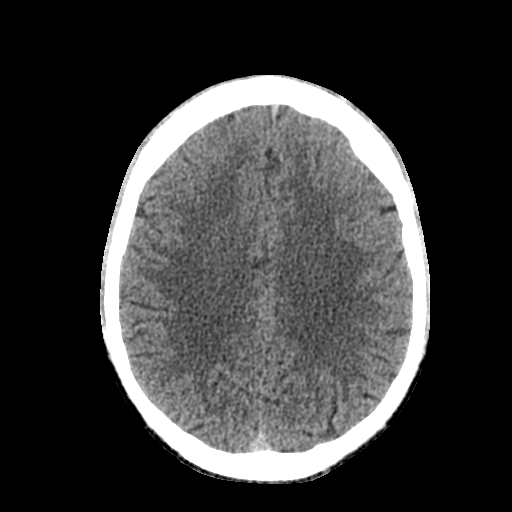
[im 18/28  bone]
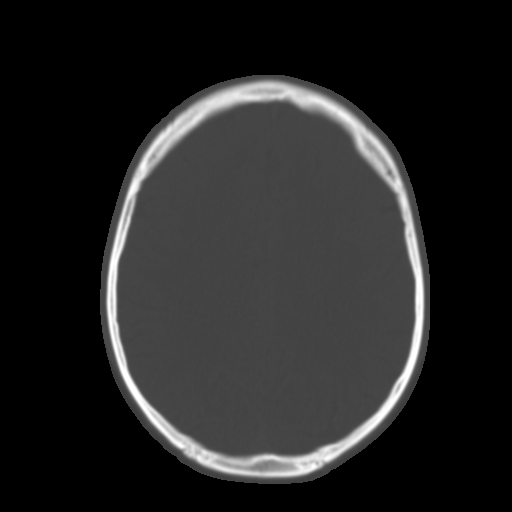
[im 20/28  brain]
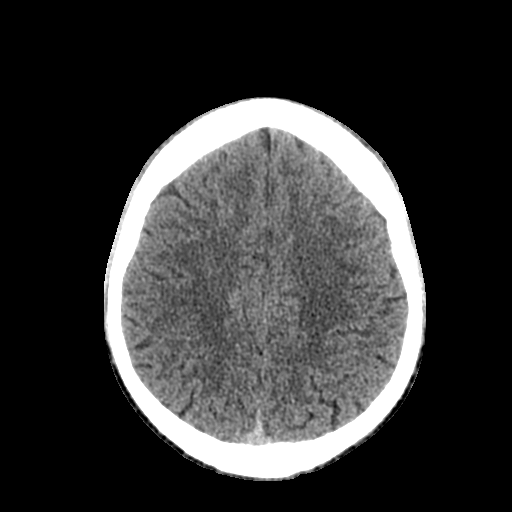
[im 22/28  brain]
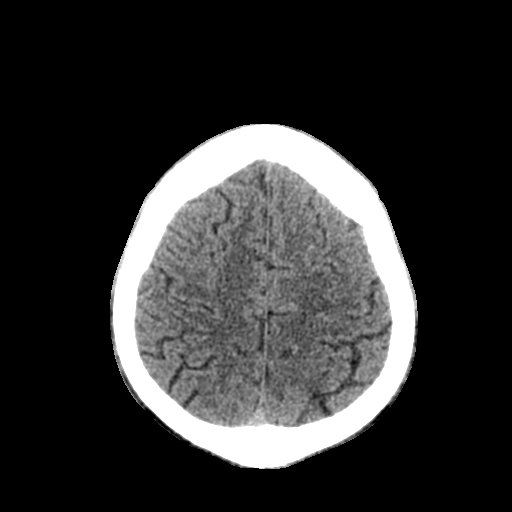
[im 24/28  brain]
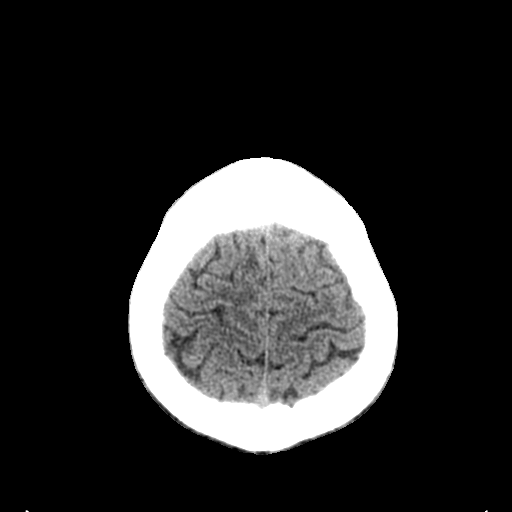
[im 26/28  brain]
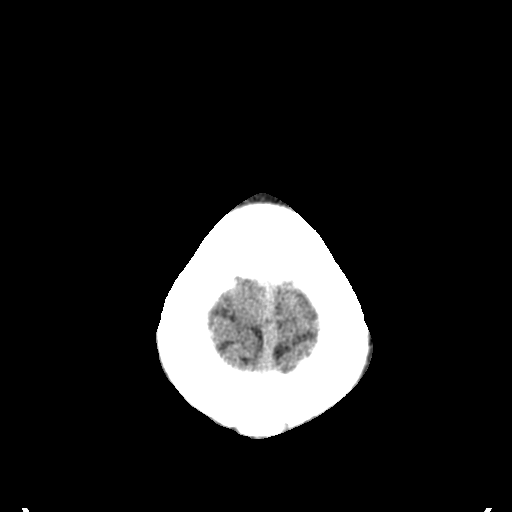
[im 26/28  bone]
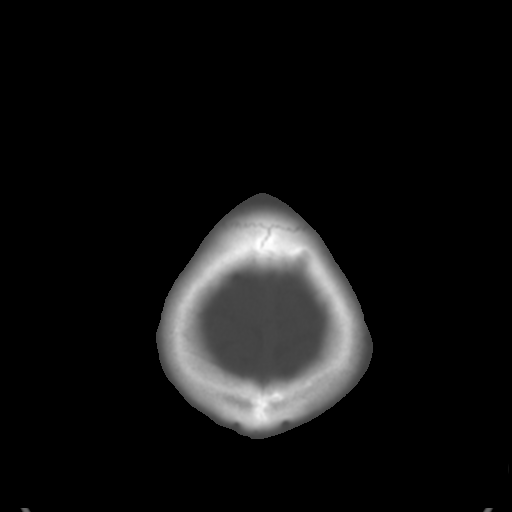

[Series 3: bone windows · axial · 0.43mm/px · z∈[-156,-116]mm · 3 of 28 slices shown]
[im 2/28  bone]
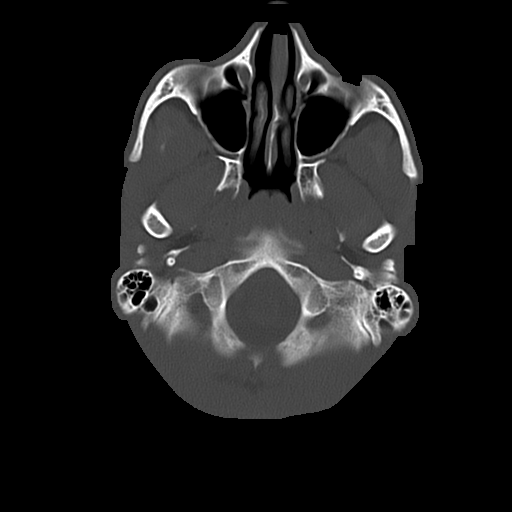
[im 6/28  bone]
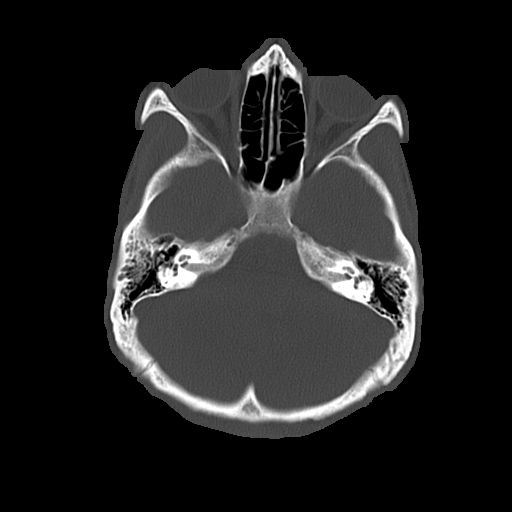
[im 10/28  bone]
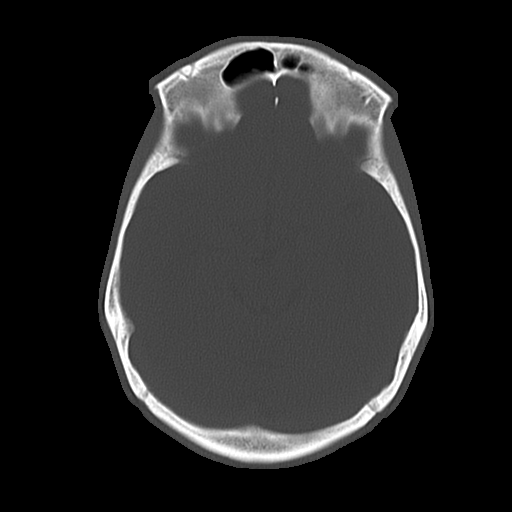

[16 of 30 positions shown; findings below may reference images not displayed]

FINDINGS: No evidence of parenchymal hemorrhage or extra-axial
fluid collection. No mass lesion, mass effect, or midline shift.

No CT evidence of acute infarction.

Cerebral volume is age appropriate.  No ventriculomegaly.

The visualized paranasal sinuses are essentially clear. The mastoid
air cells are unopacified.

No evidence of calvarial fracture.
IMPRESSION: Normal head CT.

## 2014-09-10 ENCOUNTER — Emergency Department (HOSPITAL_COMMUNITY)
Admission: EM | Admit: 2014-09-10 | Discharge: 2014-09-10 | Disposition: A | Discharge status: Home / Self Care | Payer: Self-pay | Attending: Emergency Medicine | Admitting: Emergency Medicine

## 2014-09-10 ENCOUNTER — Encounter (HOSPITAL_COMMUNITY): Payer: Self-pay | Admitting: Emergency Medicine

## 2014-09-10 ENCOUNTER — Emergency Department (HOSPITAL_COMMUNITY): Payer: Self-pay

## 2014-09-10 DIAGNOSIS — Z880 Allergy status to penicillin: Secondary | ICD-10-CM | POA: Insufficient documentation

## 2014-09-10 DIAGNOSIS — W1839XA Other fall on same level, initial encounter: Secondary | ICD-10-CM | POA: Insufficient documentation

## 2014-09-10 DIAGNOSIS — R4020 Unspecified coma: Secondary | ICD-10-CM

## 2014-09-10 DIAGNOSIS — Y9289 Other specified places as the place of occurrence of the external cause: Secondary | ICD-10-CM | POA: Insufficient documentation

## 2014-09-10 DIAGNOSIS — Z8659 Personal history of other mental and behavioral disorders: Secondary | ICD-10-CM | POA: Insufficient documentation

## 2014-09-10 DIAGNOSIS — Z043 Encounter for examination and observation following other accident: Secondary | ICD-10-CM | POA: Insufficient documentation

## 2014-09-10 DIAGNOSIS — Y998 Other external cause status: Secondary | ICD-10-CM | POA: Insufficient documentation

## 2014-09-10 DIAGNOSIS — R55 Syncope and collapse: Secondary | ICD-10-CM | POA: Insufficient documentation

## 2014-09-10 DIAGNOSIS — Y9389 Activity, other specified: Secondary | ICD-10-CM | POA: Insufficient documentation

## 2014-09-10 LAB — CBC WITH DIFFERENTIAL/PLATELET
Basophils Absolute: 0 10*3/uL (ref 0.0–0.1)
Basophils Relative: 0 % (ref 0–1)
EOS ABS: 0.1 10*3/uL (ref 0.0–0.7)
Eosinophils Relative: 1 % (ref 0–5)
HCT: 44.4 % (ref 39.0–52.0)
Hemoglobin: 14.8 g/dL (ref 13.0–17.0)
LYMPHS PCT: 28 % (ref 12–46)
Lymphs Abs: 1.8 10*3/uL (ref 0.7–4.0)
MCH: 28.9 pg (ref 26.0–34.0)
MCHC: 33.3 g/dL (ref 30.0–36.0)
MCV: 86.7 fL (ref 78.0–100.0)
Monocytes Absolute: 0.5 10*3/uL (ref 0.1–1.0)
Monocytes Relative: 8 % (ref 3–12)
Neutro Abs: 4 10*3/uL (ref 1.7–7.7)
Neutrophils Relative %: 63 % (ref 43–77)
Platelets: 189 10*3/uL (ref 150–400)
RBC: 5.12 MIL/uL (ref 4.22–5.81)
RDW: 12.6 % (ref 11.5–15.5)
WBC: 6.4 10*3/uL (ref 4.0–10.5)

## 2014-09-10 LAB — RAPID URINE DRUG SCREEN, HOSP PERFORMED
AMPHETAMINES: NOT DETECTED
BENZODIAZEPINES: NOT DETECTED
Barbiturates: NOT DETECTED
COCAINE: NOT DETECTED
Opiates: NOT DETECTED
Tetrahydrocannabinol: POSITIVE — AB

## 2014-09-10 LAB — BASIC METABOLIC PANEL
Anion gap: 8 (ref 5–15)
BUN: 12 mg/dL (ref 6–23)
CO2: 26 mmol/L (ref 19–32)
CREATININE: 0.76 mg/dL (ref 0.50–1.35)
Calcium: 9.7 mg/dL (ref 8.4–10.5)
Chloride: 104 mmol/L (ref 96–112)
GFR calc Af Amer: >90 mL/min (ref >90)
GFR calc non Af Amer: >90 mL/min (ref >90)
Glucose, Bld: 97 mg/dL (ref 70–99)
Potassium: 3.9 mmol/L (ref 3.5–5.1)
Sodium: 138 mmol/L (ref 135–145)
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
## 2014-09-10 NOTE — Discharge Instructions (Signed)
Schizophrenia Schizophrenia is a mental illness. It may cause disturbed or disorganized thinking, speech, or behavior. People with schizophrenia have problems functioning in one or more areas of life: work, school, home, or relationships. People with schizophrenia are at increased risk for suicide, certain chronic physical illnesses, and unhealthy behaviors, such as smoking and drug use. People who have family members with schizophrenia are at higher risk of developing the illness. Schizophrenia affects men and women equally but usually appears at an earlier age (teenage or early adult years) in men.  SYMPTOMS The earliest symptoms are often subtle (prodrome) and may go unnoticed until the illness becomes more severe (first-break psychosis). Symptoms of schizophrenia may be continuous or may come and go in severity. Episodes often are triggered by major life events, such as family stress, college, [employer] service, marriage, pregnancy or child birth, divorce, or loss of a loved one. People with schizophrenia may see, hear, or feel things that do not exist (hallucinations). They may have false beliefs in spite of obvious proof to the contrary (delusions). Sometimes speech is incoherent or behavior is odd or withdrawn.  DIAGNOSIS Schizophrenia is diagnosed through an assessment by your caregiver. Your caregiver will ask questions about your thoughts, behavior, mood, and ability to function in daily life. Your caregiver may ask questions about your medical history and use of alcohol or drugs, including prescription medication. Your caregiver may also order blood tests and imaging exams. Certain medical conditions and substances can cause symptoms that resemble schizophrenia. Your caregiver may refer you to a mental health specialist for evaluation. There are three major criterion for a diagnosis of schizophrenia:  Two or more of the following five symptoms are present for a month or longer:  Delusions. Often  the delusions are that you are being attacked, harassed, cheated, persecuted or conspired against (persecutory delusions).  Hallucinations.   Disorganized speech that does not make sense to others.  Grossly disorganized (confused or unfocused) behavior or extremely overactive or underactive motor activity (catatonia).  Negative symptoms such as bland or blunted emotions (flat affect), loss of will power (avolition), and withdrawal from social contacts (social isolation).  Level of functioning in one or more major areas of life (work, school, relationships, or self-care) is markedly below the level of functioning before the onset of illness.   There are continuous signs of illness (either mild symptoms or decreased level of functioning) for at least 6 months or longer. TREATMENT  Schizophrenia is a long-term illness. It is best controlled with continuous treatment rather than treatment only when symptoms occur. The following treatments are used to manage schizophrenia:  Medication--Medication is the most effective and important form of treatment for schizophrenia. Antipsychotic medications are usually prescribed to help manage schizophrenia. Other types of medication may be added to relieve any symptoms that may occur despite the use of antipsychotic medications.  Counseling or talk therapy--Individual, group, or family counseling may be helpful in providing education, support, and guidance. Many people with schizophrenia also benefit from social skills and job skills (vocational) training. A combination of medication and counseling is best for managing the disorder over time. A procedure in which electricity is applied to the brain through the scalp (electroconvulsive therapy) may be used to treat catatonic schizophrenia or schizophrenia in people who cannot take or do not respond to medication and counseling. Document Released: 05/29/2000 Document Revised: 02/01/2013 Document Reviewed:  08/24/2012 [facility] Patient Information 2015 [employer], [place]. This information is not intended to replace advice given to you by  your health care provider. Make sure you discuss any questions you have with your health care provider.  Altered Mental Status Altered mental status most often refers to an abnormal change in your responsiveness and awareness. It can affect your speech, thought, mobility, memory, attention span, or alertness. It can range from slight confusion to complete unresponsiveness (coma). Altered mental status can be a sign of a serious underlying medical condition. Rapid evaluation and medical treatment is necessary for patients having an altered mental status. CAUSES   Low blood sugar (hypoglycemia) or diabetes.  Severe loss of body fluids (dehydration) or a body salt (electrolyte) imbalance.  A stroke or other neurologic problem, such as dementia or delirium.  A head injury or tumor.  A drug or alcohol overdose.  Exposure to toxins or poisons.  Depression, anxiety, and stress.  A low oxygen level (hypoxia).  An infection.  Blood loss.  Twitching or shaking (seizure).  Heart problems, such as heart attack or heart rhythm problems (arrhythmias).  A body temperature that is too low or too high (hypothermia or hyperthermia). DIAGNOSIS  A diagnosis is based on your history, symptoms, physical and neurologic examinations, and diagnostic tests. Diagnostic tests may include:  Measurement of your blood pressure, pulse, breathing, and oxygen levels (vital signs).  Blood tests.  Urine tests.  X-ray exams.  A computerized magnetic scan (magnetic resonance imaging, MRI).  A computerized X-ray scan (computed tomography, CT scan). TREATMENT  Treatment will depend on the cause. Treatment may include:  Management of an underlying medical or mental health condition.  Critical care or support in the hospital. HOME CARE INSTRUCTIONS   Only take over-the-counter  or prescription medicines for pain, discomfort, or fever as directed by your caregiver.  Manage underlying conditions as directed by your caregiver.  Eat a healthy, well-balanced diet to maintain strength.  Join a support group or prevention program to cope with the condition or trauma that caused the altered mental status. Ask your caregiver to help choose a program that works for you.  Follow up with your caregiver for further examination, therapy, or testing as directed. SEEK MEDICAL CARE IF:   You feel unwell or have chills.  You or your family notice a change in your behavior or your alertness.  You have trouble following your caregiver's treatment plan.  You have questions or concerns. SEEK IMMEDIATE MEDICAL CARE IF:   You have a rapid heartbeat or have chest pain.  You have difficulty breathing.  You have a fever.  You have a headache with a stiff neck.  You cough up blood.  You have blood in your urine or stool.  You have severe agitation or confusion. MAKE SURE YOU:   Understand these instructions.  Will watch your condition.  Will get help right away if you are not doing well or get worse. Document Released: 11/19/2009 Document Revised: 08/24/2011 Document Reviewed: 11/19/2009 [facility] Patient Information 2015 [place], [place]. This information is not intended to replace advice given to you by your health care provider. Make sure you discuss any questions you have with your health care provider.

## 2014-09-10 NOTE — ED Notes (Signed)
Pt comes from [facility], noncompliant with meds. Found supine on the floor, unwitnessed fall, pt unresponsive. Per EMS pupils are a 6 and reactive. Per EMS pt is not currently on medications, he only takes a trazadone.

## 2014-09-10 NOTE — ED Provider Notes (Signed)
CSN: [id number]     Arrival date & time 09/10/14  1414 History   First MD Initiated Contact with Patient 09/10/14 1506     Chief Complaint  Patient presents with  . med clearance   . Fall     (Consider location/radiation/quality/duration/timing/severity/associated sxs/prior Treatment) HPI    PCP: No primary care provider on file. Blood pressure 107/69, pulse 62, temperature 98.4 F (36.9 C), temperature source Oral, resp. rate 18, SpO2 100 %.  Cody [name] is a 24 y.o.male with a significant PMH of bipolar 1 disorder, mood disorder, and hx of single seizure 1 year ago presents to the ER with complaints of possible loc. [facility] for worsening aggression, auditory hallucinations and delusions of death. He is supposed to take Trazodone but is not compliant. The transport office reports checking on patient every 20 minutes. At the first check point he was laying bed alert and on the second check twenty minutes later he was laying on the ground and thought to be unresponsive. Per EMS he continued to be unresponsive. During the patient is alert and oriented and reports he has had 1 previous history of seizure 2 years ago but has not been on any medication. He denies having any injuries or pains anywhere. He has not had bowel or urine incontinence. He denies headache, nausea, vomiting, diarrhea, chest pain, abdominal pain, back pain.   Past Medical History  Diagnosis Date  . Bipolar 1 disorder   . Mood disorder    History reviewed. No pertinent past surgical history. History reviewed. No pertinent family history. History  Substance Use Topics  . Smoking status: Never Smoker   . Smokeless tobacco: Not on file  . Alcohol Use: Yes    Review of Systems 10 Systems reviewed and are negative for acute change except as noted in the HPI.      Allergies  Penicillins  Home Medications   Prior to Admission medications   Medication Sig Start Date End  Date Taking? Authorizing Provider  ibuprofen (ADVIL,MOTRIN) 200 MG tablet Take 400 mg by mouth every 6 (six) hours as needed for headache or moderate pain.   Yes Historical Provider, MD   BP 113/75 mmHg  Pulse 59  Temp(Src) 98.4 F (36.9 C) (Oral)  Resp 18  SpO2 97% Physical Exam  Constitutional: He appears well-developed and well-nourished. No distress.  HENT:  Head: Normocephalic and atraumatic. Head is without abrasion, without contusion, without laceration, without right periorbital erythema and without left periorbital erythema.  Right Ear: Tympanic membrane normal.  Left Ear: Tympanic membrane normal.  Nose: Nose normal.  Mouth/Throat: Uvula is midline, oropharynx is clear and moist and mucous membranes are normal.  Eyes: Pupils are equal, round, and reactive to light.  Neck: Normal range of motion. Neck supple. No spinous process tenderness and no muscular tenderness present. Normal range of motion present.  Cardiovascular: Normal rate and regular rhythm.   Pulmonary/Chest: Effort normal.  No signs of trauma, tenderness or bruising  Abdominal: Soft.  No signs of trauma, tenderness or bruising  Genitourinary:  No bowel or urine incont.  Neurological: He is alert.  Cranial nerves II-VIII and X-XII evaluated and show no deficits. Pt alert and oriented x 3 Upper and lower extremity strength is symmetrical and physiologic Normal muscular tone No facial droop Coordination intact, no limb ataxia    Skin: Skin is warm and dry.  Psychiatric: His speech is normal and behavior is normal. His mood  appears not anxious. He is not actively hallucinating. Thought content is not paranoid. He does not exhibit a depressed mood. He expresses no homicidal and no suicidal ideation.  Nursing note and vitals reviewed.   ED Course  Procedures (including critical care time) Labs Review Labs Reviewed  URINE RAPID DRUG SCREEN (HOSP PERFORMED) - Abnormal; Notable for the following:     Tetrahydrocannabinol POSITIVE (*)    All other components within normal limits  CBC WITH DIFFERENTIAL/PLATELET  BASIC METABOLIC PANEL    Imaging Review Ct Head Wo Contrast  09/10/2014   CLINICAL DATA:  24 year old male with acute altered mental status. History of schizophrenia.  EXAM: CT HEAD WITHOUT CONTRAST  TECHNIQUE: Contiguous axial images were obtained from the base of the skull through the vertex without intravenous contrast.  COMPARISON:  09/18/2011 head CT  FINDINGS: No intracranial abnormalities are identified, including mass lesion or mass effect, hydrocephalus, extra-axial fluid collection, midline shift, hemorrhage, or acute infarction.  The visualized bony calvarium is unremarkable.  IMPRESSION: Unremarkable noncontrast head CT.   Electronically Signed   By: Harmon Pier M.D.   On: 09/10/2014 19:26     EKG Interpretation None      MDM   Final diagnoses:  LOC (loss of consciousness)   5:00pm Spoke with Dr. Hosie Poisson with neurology who does not feel that this was consistent with seizure activity. He believes it may be related to his schizophrenia disorder. He however feels that CBC, BMP, EKG and CT of the head are recommended for evaluation. If these are nonacute he feels that he can be discharged and does not recommend starting any medications.  The patients head CT is unremarkable. He continues to be at baseline. Per neurology recommendations patient will be sent back to [facility] and continue to receive treatment for Schizophrenia.   24 y.o.Cody [name]'s evaluation in the Emergency Department is complete. It has been determined that no acute conditions requiring further emergency intervention are present at this time. The patient/guardian have been advised of the diagnosis and plan. We have discussed signs and symptoms that warrant return to the ED, such as changes or worsening in symptoms.  Vital signs are stable at discharge. Filed Vitals:   09/10/14 1832  BP: 113/75   Pulse: 59  Temp:   Resp: 18    Patient/guardian has voiced understanding and agreed to follow-up with the PCP or specialist.   Marlon Pel, PA-C 09/10/14 1944  Blake Divine, MD 09/11/14 2356

## 2014-09-10 NOTE — ED Notes (Signed)
Pt is IVC, [facility], [facility]

## 2014-09-10 NOTE — Progress Notes (Signed)
24 yr old self pay [place] pt without pcp as confirmed by his mother.  [facility] [place]  [facility] paper work states "worsening physical aggression and pt having auditory hallucinations and delusions of death". Hx of schizophrenia, non compliant with meds.  Pt informed CM he "definitely have a doctor at [place] but don't know the name.  Call my momma at [phone]"  Cm called and spoke with mother, French Anaracy who states pt was on his father's insurance but not any longer and there was an increase in the cost of his medications is the reason he was noncompliant with meds.  Mother wants to get pt medicaid.  She [facility] that [facility] assist with [facility] application. Cm directed her to local DSS and www.medicaid.gov to initiate the application. Discussed with mother that if he was admitted inpatient he may have opportunity to see a Artistfinancial counselor but [facility].  She voiced understanding and states she will start pt an application online. [place] and informed mother CM would leave resources in a pt belonging bag for pt and do a [facility] until medicaid application completed and approved Mother voiced understanding

## 2014-10-05 ENCOUNTER — Emergency Department (HOSPITAL_COMMUNITY)
Admission: EM | Admit: 2014-10-05 | Discharge: 2014-10-05 | Disposition: A | Discharge status: Home / Self Care | Payer: Self-pay | Attending: Emergency Medicine | Admitting: Emergency Medicine

## 2014-10-05 ENCOUNTER — Encounter (HOSPITAL_COMMUNITY): Payer: Self-pay | Admitting: Emergency Medicine

## 2014-10-05 DIAGNOSIS — Z880 Allergy status to penicillin: Secondary | ICD-10-CM | POA: Insufficient documentation

## 2014-10-05 DIAGNOSIS — R110 Nausea: Secondary | ICD-10-CM | POA: Insufficient documentation

## 2014-10-05 DIAGNOSIS — F1210 Cannabis abuse, uncomplicated: Secondary | ICD-10-CM | POA: Insufficient documentation

## 2014-10-05 DIAGNOSIS — Z8659 Personal history of other mental and behavioral disorders: Secondary | ICD-10-CM | POA: Insufficient documentation

## 2014-10-05 DIAGNOSIS — R109 Unspecified abdominal pain: Secondary | ICD-10-CM | POA: Insufficient documentation

## 2014-10-05 LAB — CBC WITH DIFFERENTIAL/PLATELET
Basophils Absolute: 0 10*3/uL (ref 0.0–0.1)
Basophils Relative: 0 % (ref 0–1)
Eosinophils Absolute: 0.1 10*3/uL (ref 0.0–0.7)
Eosinophils Relative: 2 % (ref 0–5)
HCT: 37.8 % — ABNORMAL LOW (ref 39.0–52.0)
Hemoglobin: 12.4 g/dL — ABNORMAL LOW (ref 13.0–17.0)
LYMPHS PCT: 30 % (ref 12–46)
Lymphs Abs: 2.1 10*3/uL (ref 0.7–4.0)
MCH: 28.8 pg (ref 26.0–34.0)
MCHC: 32.8 g/dL (ref 30.0–36.0)
MCV: 87.9 fL (ref 78.0–100.0)
MONO ABS: 0.7 10*3/uL (ref 0.1–1.0)
MONOS PCT: 10 % (ref 3–12)
Neutro Abs: 4.1 10*3/uL (ref 1.7–7.7)
Neutrophils Relative %: 58 % (ref 43–77)
PLATELETS: 179 10*3/uL (ref 150–400)
RBC: 4.3 MIL/uL (ref 4.22–5.81)
RDW: 13.4 % (ref 11.5–15.5)
WBC: 7 10*3/uL (ref 4.0–10.5)

## 2014-10-05 LAB — LIPASE, BLOOD: Lipase: 20 U/L (ref 11–59)

## 2014-10-05 LAB — COMPREHENSIVE METABOLIC PANEL
ALK PHOS: 47 U/L (ref 39–117)
ALT: 11 U/L (ref 0–53)
ANION GAP: 9 (ref 5–15)
AST: 16 U/L (ref 0–37)
Albumin: 3.7 g/dL (ref 3.5–5.2)
BUN: 10 mg/dL (ref 6–23)
CHLORIDE: 106 mmol/L (ref 96–112)
CO2: 24 mmol/L (ref 19–32)
Calcium: 8.9 mg/dL (ref 8.4–10.5)
Creatinine, Ser: 0.83 mg/dL (ref 0.50–1.35)
GFR calc non Af Amer: >90 mL/min (ref >90)
Glucose, Bld: 85 mg/dL (ref 70–99)
Potassium: 4.1 mmol/L (ref 3.5–5.1)
Sodium: 139 mmol/L (ref 135–145)
Total Bilirubin: 0.9 mg/dL (ref 0.3–1.2)
Total Protein: 6 g/dL (ref 6.0–8.3)

## 2014-10-05 LAB — RAPID URINE DRUG SCREEN, HOSP PERFORMED
AMPHETAMINES: NOT DETECTED
BARBITURATES: NOT DETECTED
Benzodiazepines: NOT DETECTED
COCAINE: NOT DETECTED
OPIATES: NOT DETECTED
TETRAHYDROCANNABINOL: POSITIVE — AB

## 2014-10-05 LAB — ETHANOL: Alcohol, Ethyl (B): <5 mg/dL (ref 0–9)

## 2014-10-05 MED ORDER — ONDANSETRON HCL 4 MG/2ML IJ SOLN: 4.0000 mg | Freq: Once | INTRAMUSCULAR | Status: DC

## 2014-10-05 NOTE — ED Notes (Signed)
Pt given snack and drink as requested. 

## 2014-10-05 NOTE — Discharge Instructions (Signed)

## 2014-10-05 NOTE — ED Notes (Addendum)
Pt st's he thinks the new psych meds he is taking is making him feel bad.  Also st's he wants to be screened for lung ca.  C/o feeling lightheaded, nauseated and abd cramping

## 2014-10-05 NOTE — ED Provider Notes (Signed)
CSN: [id number]     Arrival date & time 10/05/14  1541 History   First MD Initiated Contact with Patient 10/05/14 1814     Chief Complaint  Patient presents with  . Nausea     (Consider location/radiation/quality/duration/timing/severity/associated sxs/prior Treatment) HPI Comments: Patient presents emergency department with chief complaint of abdominal pain. He reports abdominal pain and nausea without vomiting 2 days. Patient thinks that his symptoms are being caused by his new psych meds. He denies any suicidal or homicidal ideation. He is tearful at the bedside. There are no aggravating or alleviating factors. He denies any fevers, chills, chest pain, cough, diarrhea, or constipation. Denies any dysuria or hematuria.  The history is provided by the patient. No language interpreter was used.    Past Medical History  Diagnosis Date  . Bipolar 1 disorder   . Mood disorder    History reviewed. No pertinent past surgical history. No family history on file. History  Substance Use Topics  . Smoking status: Never Smoker   . Smokeless tobacco: Not on file  . Alcohol Use: Yes    Review of Systems  Constitutional: Negative for fever and chills.  Respiratory: Negative for shortness of breath.   Cardiovascular: Negative for chest pain.  Gastrointestinal: Positive for abdominal pain. Negative for nausea, vomiting, diarrhea and constipation.  Genitourinary: Negative for dysuria.  All other systems reviewed and are negative.     Allergies  Penicillins  Home Medications   Prior to Admission medications   Medication Sig Start Date End Date Taking? Authorizing Provider  ibuprofen (ADVIL,MOTRIN) 200 MG tablet Take 400 mg by mouth every 6 (six) hours as needed for headache or moderate pain.    Historical Provider, MD   BP 119/75 mmHg  Pulse 69  Temp(Src) 98.3 F (36.8 C) (Oral)  Resp 18  Ht  (1.778 m)  Wt 146 lb 10 oz (66.509 kg)  BMI 21.04 kg/m2  SpO2 100% Physical  Exam  Constitutional: He is oriented to person, place, and time. He appears well-developed and well-nourished.  HENT:  Head: Normocephalic and atraumatic.  Eyes: Conjunctivae and EOM are normal. Pupils are equal, round, and reactive to light. Right eye exhibits no discharge. Left eye exhibits no discharge. No scleral icterus.  Neck: Normal range of motion. Neck supple. No JVD present.  Cardiovascular: Normal rate, regular rhythm and normal heart sounds.  Exam reveals no gallop and no friction rub.   No murmur heard. Pulmonary/Chest: Effort normal and breath sounds normal. No respiratory distress. He has no wheezes. He has no rales. He exhibits no tenderness.  Abdominal: Soft. He exhibits no distension and no mass. There is no tenderness. There is no rebound and no guarding.  No focal abdominal tenderness, no RLQ tenderness or pain at McBurney's point, no RUQ tenderness or Murphy's sign, no left-sided abdominal tenderness, no fluid wave, or signs of peritonitis   Musculoskeletal: Normal range of motion. He exhibits no edema or tenderness.  Neurological: He is alert and oriented to person, place, and time.  Skin: Skin is warm and dry.  Psychiatric: His behavior is normal. Judgment and thought content normal.  Tearful at bedside, flat affect  Nursing note and vitals reviewed.   ED Course  Procedures (including critical care time) Results for orders placed or performed during the hospital encounter of 10/05/14  CBC with Differential/Platelet  Result Value Ref Range   WBC 7.0 4.0 - 10.5 K/uL   RBC 4.30 4.22 - 5.81 MIL/uL   Hemoglobin  12.4 (L) 13.0 - 17.0 g/dL   HCT 95.637.8 (L) 21.339.0 - 08.652.0 %   MCV 87.9 78.0 - 100.0 fL   MCH 28.8 26.0 - 34.0 pg   MCHC 32.8 30.0 - 36.0 g/dL   RDW 57.813.4 46.911.5 - 62.915.5 %   Platelets 179 150 - 400 K/uL   Neutrophils Relative % 58 43 - 77 %   Neutro Abs 4.1 1.7 - 7.7 K/uL   Lymphocytes Relative 30 12 - 46 %   Lymphs Abs 2.1 0.7 - 4.0 K/uL   Monocytes Relative 10 3  - 12 %   Monocytes Absolute 0.7 0.1 - 1.0 K/uL   Eosinophils Relative 2 0 - 5 %   Eosinophils Absolute 0.1 0.0 - 0.7 K/uL   Basophils Relative 0 0 - 1 %   Basophils Absolute 0.0 0.0 - 0.1 K/uL  Comprehensive metabolic panel  Result Value Ref Range   Sodium 139 135 - 145 mmol/L   Potassium 4.1 3.5 - 5.1 mmol/L   Chloride 106 96 - 112 mmol/L   CO2 24 19 - 32 mmol/L   Glucose, Bld 85 70 - 99 mg/dL   BUN 10 6 - 23 mg/dL   Creatinine, Ser 5.280.83 0.50 - 1.35 mg/dL   Calcium 8.9 8.4 - 41.310.5 mg/dL   Total Protein 6.0 6.0 - 8.3 g/dL   Albumin 3.7 3.5 - 5.2 g/dL   AST 16 0 - 37 U/L   ALT 11 0 - 53 U/L   Alkaline Phosphatase 47 39 - 117 U/L   Total Bilirubin 0.9 0.3 - 1.2 mg/dL   GFR calc non Af Amer >90 >90 mL/min   GFR calc Af Amer >90 >90 mL/min   Anion gap 9 5 - 15  Lipase, blood  Result Value Ref Range   Lipase 20 11 - 59 U/L  Ethanol  Result Value Ref Range   Alcohol, Ethyl (B) <5 0 - 9 mg/dL   Ct Head Wo Contrast  09/10/2014   CLINICAL DATA:  24 year old male with acute altered mental status. History of schizophrenia.  EXAM: CT HEAD WITHOUT CONTRAST  TECHNIQUE: Contiguous axial images were obtained from the base of the skull through the vertex without intravenous contrast.  COMPARISON:  09/18/2011 head CT  FINDINGS: No intracranial abnormalities are identified, including mass lesion or mass effect, hydrocephalus, extra-axial fluid collection, midline shift, hemorrhage, or acute infarction.  The visualized bony calvarium is unremarkable.  IMPRESSION: Unremarkable noncontrast head CT.   Electronically Signed   By: Harmon PierJeffrey  Hu M.D.   On: 09/10/2014 19:26      EKG Interpretation None      MDM   Final diagnoses:  Nausea    Patient with nonspecific abdominal pain 2 days, reports associated nausea, but no vomiting. Abdomen is soft and nontender. Patient is well-appearing, but tearful during the exam. Could be medication related. Will check basic labs, and will reassess. Denies SI  or HI. Suspected outpatient follow-up will be appropriate.  9:47 PM Patient to better. He is requesting discharge to home. No indication for further emergent workup. Patient is stable and ready for discharge.    Roxy Horsemanobert Zaara Sprowl, PA-C 10/05/14 2147  Rolland PorterMark James, MD 10/16/14 [id number]

## 2014-10-05 NOTE — ED Notes (Signed)
Pt requesting to be discharged, states he feels better and his ride is waiting, PA informed and to bedside to update patient

## 2014-10-05 NOTE — ED Notes (Signed)
Pt states he is feeling much better at this time, denies need for nausea medication, denies pain. Understands that lab work will still be obtained, aware to let RN know if symptoms return and he would like medication.

## 2014-11-01 ENCOUNTER — Emergency Department (HOSPITAL_COMMUNITY)
Admission: EM | Admit: 2014-11-01 | Discharge: 2014-11-01 | Disposition: A | Discharge status: Home / Self Care | Payer: Self-pay | Attending: Emergency Medicine | Admitting: Emergency Medicine

## 2014-11-01 ENCOUNTER — Encounter (HOSPITAL_COMMUNITY): Payer: Self-pay | Admitting: Emergency Medicine

## 2014-11-01 DIAGNOSIS — Z880 Allergy status to penicillin: Secondary | ICD-10-CM | POA: Insufficient documentation

## 2014-11-01 DIAGNOSIS — F319 Bipolar disorder, unspecified: Secondary | ICD-10-CM | POA: Insufficient documentation

## 2014-11-01 DIAGNOSIS — Z760 Encounter for issue of repeat prescription: Secondary | ICD-10-CM | POA: Insufficient documentation

## 2014-11-01 MED ORDER — DIVALPROEX SODIUM 250 MG PO DR TAB: 250.0000 mg | DELAYED_RELEASE_TABLET | Freq: Every day | ORAL | Status: DC

## 2014-11-01 MED ORDER — RISPERIDONE 0.5 MG PO TABS: 0.5000 mg | ORAL_TABLET | Freq: Every day | ORAL | Status: DC

## 2014-11-01 NOTE — Care Management Note (Signed)
Case Management Note  Patient Details  Name: Cody [name] MRN: [id number] Date of Birth: 31-Mar-1991  Subjective/Objective:                    Action/Plan:   Expected Discharge Date:                  Expected Discharge Plan:  Home/Self Care  In-House Referral:     Discharge planning Services  [facility]  Post Acute Care Choice:    Choice offered to:     DME Arranged:    DME Agency:     HH Arranged:    HH Agency:     Status of Service:  Completed, signed off  Medicare Important Message Given:    Date Medicare IM Given:    Medicare IM give by:    Date Additional Medicare IM Given:    Additional Medicare Important Message give by:     If discussed at Long Length of Stay Meetings, dates discussed:    Additional CommentsRadford Pax:  Cody Schoch, RN 11/01/2014, 8:51 PM
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
## 2014-11-01 NOTE — Progress Notes (Signed)
[facility] for [facility] assistance.  Patient without pcp or insurance. Patent presenting to ED with need for medication refill as [facility] "did not give enough."  Patient's mother reports patient has been off medications for 4.5 weeks.  EDCM [facility].  EDCM explained to patient's mother that this is a one time assist and patient will not be eligible for this assistance again until one year has passed.  Patient's mother agreeable to 3 dollar copay.  EDCM informed patient's mother that [facility] may not work for resperidone.  EDCM provided patient's mother with coupon from goodrx.com for resperidone for $10.88.  Patient's mother is agreeable to this copay.  EDCM explained to patient's mother that prescriptions need to get filled within seven days other wise [facility] will expire.  Patient's mother reports medications will be filled this evening.   [facility] with [facility], informed patient of services there and walk in times.  EDCM also provided patient with list of pcps who accept self pay patients, list of discount pharmacies and websites needymeds.org and GoodRX.com for medication assistance, phone number to inquire about the orange card, phone number to inquire about Mediciad, phone number to inquire about the Affordable Care Act, financial resources in the community such as local churches, salvation army, urban ministries, and dental assistance for uninsured patients. Patient's mother reports they are having difficulty getting patient's [facility] even when they "walk in."  [facility] received patient's mother's permission to [facility] out to patient and his mother regarding medications.   Patient's mother thankful for resources.  No further EDCM needs at this time.

## 2014-11-01 NOTE — Discharge Instructions (Signed)
Medication Refill, Emergency Department °We have refilled your medication today as a courtesy to you. It is best for your medical care, however, to take care of getting refills done through your primary caregiver's office. They have your records and can do a better job of follow-up than we can in the emergency department. °On maintenance medications, we often only prescribe enough medications to get you by until you are able to see your regular caregiver. This is a more expensive way to refill medications. °In the future, please plan for refills so that you will not have to use the emergency department for this. °Thank you for your help. Your help allows us to better take care of the daily emergencies that enter our department. °Document Released: 09/18/2003 Document Revised: 08/24/2011 Document Reviewed: 09/08/2013 °ExitCare® Patient Information ©2015 [employer]. This information is not intended to replace advice given to you by your health care provider. Make sure you discuss any questions you have with your health care provider. ° °

## 2014-11-02 ENCOUNTER — Emergency Department (HOSPITAL_COMMUNITY)
Admission: EM | Admit: 2014-11-02 | Discharge: 2014-11-03 | Disposition: A | Discharge status: Other Acute Inpatient Hospital | Payer: Federal, State, Local not specified - Other | Attending: Emergency Medicine | Admitting: Emergency Medicine

## 2014-11-02 ENCOUNTER — Encounter (HOSPITAL_COMMUNITY): Payer: Self-pay | Admitting: Emergency Medicine

## 2014-11-02 DIAGNOSIS — R000 Tachycardia, unspecified: Secondary | ICD-10-CM | POA: Insufficient documentation

## 2014-11-02 DIAGNOSIS — F23 Brief psychotic disorder: Secondary | ICD-10-CM

## 2014-11-02 DIAGNOSIS — Z880 Allergy status to penicillin: Secondary | ICD-10-CM | POA: Insufficient documentation

## 2014-11-02 DIAGNOSIS — F29 Unspecified psychosis not due to a substance or known physiological condition: Secondary | ICD-10-CM | POA: Insufficient documentation

## 2014-11-02 DIAGNOSIS — F319 Bipolar disorder, unspecified: Secondary | ICD-10-CM | POA: Insufficient documentation

## 2014-11-02 LAB — CBC
HCT: 45.5 % (ref 39.0–52.0)
Hemoglobin: 15.7 g/dL (ref 13.0–17.0)
MCH: 30.3 pg (ref 26.0–34.0)
MCHC: 34.5 g/dL (ref 30.0–36.0)
MCV: 87.8 fL (ref 78.0–100.0)
Platelets: 193 10*3/uL (ref 150–400)
RBC: 5.18 MIL/uL (ref 4.22–5.81)
RDW: 13 % (ref 11.5–15.5)
WBC: 10.8 10*3/uL — ABNORMAL HIGH (ref 4.0–10.5)

## 2014-11-02 LAB — COMPREHENSIVE METABOLIC PANEL
ALK PHOS: 54 U/L (ref 38–126)
ALT: 21 U/L (ref 17–63)
AST: 59 U/L — AB (ref 15–41)
Albumin: 5.2 g/dL — ABNORMAL HIGH (ref 3.5–5.0)
Anion gap: 12 (ref 5–15)
BUN: 13 mg/dL (ref 6–20)
CHLORIDE: 100 mmol/L — AB (ref 101–111)
CO2: 26 mmol/L (ref 22–32)
Calcium: 9.8 mg/dL (ref 8.9–10.3)
Creatinine, Ser: 0.92 mg/dL (ref 0.61–1.24)
Glucose, Bld: 108 mg/dL — ABNORMAL HIGH (ref 65–99)
POTASSIUM: 3.3 mmol/L — AB (ref 3.5–5.1)
SODIUM: 138 mmol/L (ref 135–145)
Total Bilirubin: 1.8 mg/dL — ABNORMAL HIGH (ref 0.3–1.2)
Total Protein: 8.2 g/dL — ABNORMAL HIGH (ref 6.5–8.1)

## 2014-11-02 LAB — RAPID URINE DRUG SCREEN, HOSP PERFORMED
Amphetamines: NOT DETECTED
BENZODIAZEPINES: NOT DETECTED
Barbiturates: NOT DETECTED
Cocaine: NOT DETECTED
OPIATES: NOT DETECTED
Tetrahydrocannabinol: NOT DETECTED

## 2014-11-02 LAB — ETHANOL: Alcohol, Ethyl (B): <5 mg/dL (ref <5)

## 2014-11-02 LAB — VALPROIC ACID LEVEL: Valproic Acid Lvl: <10 ug/mL — ABNORMAL LOW (ref 50.0–100.0)

## 2014-11-02 MED ORDER — DIVALPROEX SODIUM 250 MG PO DR TAB
250.0000 mg | DELAYED_RELEASE_TABLET | Freq: Every day | ORAL | Status: DC
Start: 2014-11-02 — End: 2014-11-03
  Administered 2014-11-02: 250 mg via ORAL
  Filled 2014-11-02: qty 1

## 2014-11-02 MED ORDER — NICOTINE 21 MG/24HR TD PT24: 21.0000 mg | MEDICATED_PATCH | Freq: Every day | TRANSDERMAL | Status: DC

## 2014-11-02 MED ORDER — ALUM & MAG HYDROXIDE-SIMETH 200-200-20 MG/5ML PO SUSP: 30.0000 mL | ORAL | Status: DC | PRN

## 2014-11-02 MED ORDER — ONDANSETRON HCL 4 MG PO TABS
4.0000 mg | ORAL_TABLET | Freq: Three times a day (TID) | ORAL | Status: DC | PRN
Start: 2014-11-02 — End: 2014-11-03

## 2014-11-02 MED ORDER — RISPERIDONE 0.5 MG PO TABS
0.5000 mg | ORAL_TABLET | Freq: Every day | ORAL | Status: DC
  Administered 2014-11-02: 0.5 mg via ORAL
  Filled 2014-11-02: qty 1

## 2014-11-02 NOTE — ED Notes (Signed)
Report received from Dawnley RN. Pt. Alert and oriented in no distress denies SI, HI, AVH and pain.  Pt. Instructed to come to me with problems or concerns.Will continue to monitor for safety via security cameras and Q 15 minute checks. 

## 2014-11-02 NOTE — ED Notes (Signed)
MD at bedside. Dr. Allen at bedside.  

## 2014-11-02 NOTE — ED Notes (Signed)
Pt arrives with [employer]. Pt is in handcuffs.

## 2014-11-02 NOTE — ED Notes (Signed)
Telepsych in progress. 
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
## 2014-11-02 NOTE — ED Notes (Signed)
Pt and pt's belongings wanded by security.  

## 2014-11-02 NOTE — ED Notes (Signed)
Pt. Noted in room. No complaints or concerns voiced. No distress noted. Will continue to monitor with security cameras. Q 15 minute rounds continue. 

## 2014-11-02 NOTE — ED Notes (Signed)
Bed: WA28 Expected date:  Expected time:  Means of arrival:  Comments: 

## 2014-11-03 ENCOUNTER — Encounter (HOSPITAL_COMMUNITY): Payer: Self-pay | Admitting: *Deleted

## 2014-11-03 ENCOUNTER — Inpatient Hospital Stay (HOSPITAL_COMMUNITY)
Admission: AD | Admit: 2014-11-03 | Discharge: 2014-11-10 | DRG: 885 | Disposition: A | Discharge status: Home / Self Care | Payer: Federal, State, Local not specified - Other | Attending: Psychiatry | Admitting: Psychiatry

## 2014-11-03 DIAGNOSIS — F250 Schizoaffective disorder, bipolar type: Principal | ICD-10-CM | POA: Diagnosis present

## 2014-11-03 DIAGNOSIS — F259 Schizoaffective disorder, unspecified: Secondary | ICD-10-CM | POA: Diagnosis present

## 2014-11-03 DIAGNOSIS — F29 Unspecified psychosis not due to a substance or known physiological condition: Secondary | ICD-10-CM | POA: Diagnosis present

## 2014-11-03 DIAGNOSIS — E876 Hypokalemia: Secondary | ICD-10-CM | POA: Diagnosis present

## 2014-11-03 DIAGNOSIS — F17200 Nicotine dependence, unspecified, uncomplicated: Secondary | ICD-10-CM | POA: Diagnosis present

## 2014-11-03 HISTORY — DX: Schizophrenia, unspecified: F20.9

## 2014-11-03 MED ORDER — DIVALPROEX SODIUM 250 MG PO DR TAB
250.0000 mg | DELAYED_RELEASE_TABLET | Freq: Every day | ORAL | Status: DC
  Administered 2014-11-03 – 2014-11-04 (×2): 250 mg via ORAL
  Filled 2014-11-03 (×4): qty 1

## 2014-11-03 MED ORDER — RISPERIDONE 2 MG PO TBDP
2.0000 mg | ORAL_TABLET | Freq: Two times a day (BID) | ORAL | Status: DC
  Administered 2014-11-03: 2 mg via ORAL
  Filled 2014-11-03 (×2): qty 1

## 2014-11-03 MED ORDER — LORAZEPAM 1 MG PO TABS
1.0000 mg | ORAL_TABLET | Freq: Two times a day (BID) | ORAL | Status: DC
  Administered 2014-11-03: 1 mg via ORAL
  Filled 2014-11-03: qty 1

## 2014-11-03 MED ORDER — ACETAMINOPHEN 325 MG PO TABS
650.0000 mg | ORAL_TABLET | Freq: Four times a day (QID) | ORAL | Status: DC | PRN
  Administered 2014-11-06 – 2014-11-08 (×2): 650 mg via ORAL
  Filled 2014-11-03 (×2): qty 2

## 2014-11-03 MED ORDER — ONDANSETRON HCL 4 MG PO TABS: 4.0000 mg | ORAL_TABLET | Freq: Three times a day (TID) | ORAL | Status: DC | PRN

## 2014-11-03 MED ORDER — BENZTROPINE MESYLATE 1 MG PO TABS
1.0000 mg | ORAL_TABLET | Freq: Two times a day (BID) | ORAL | Status: DC
  Administered 2014-11-03: 1 mg via ORAL
  Filled 2014-11-03: qty 1

## 2014-11-03 MED ORDER — POTASSIUM CHLORIDE CRYS ER 20 MEQ PO TBCR
40.0000 meq | EXTENDED_RELEASE_TABLET | Freq: Once | ORAL | Status: AC
  Administered 2014-11-03: 40 meq via ORAL
  Filled 2014-11-03: qty 2

## 2014-11-03 MED ORDER — NICOTINE 21 MG/24HR TD PT24
21.0000 mg | MEDICATED_PATCH | Freq: Every day | TRANSDERMAL | Status: DC
Start: 2014-11-04 — End: 2014-11-04
  Filled 2014-11-03 (×4): qty 1

## 2014-11-03 MED ORDER — HYDROXYZINE HCL 50 MG PO TABS
50.0000 mg | ORAL_TABLET | Freq: Every evening | ORAL | Status: DC | PRN
  Administered 2014-11-04: 50 mg via ORAL
  Filled 2014-11-03: qty 1

## 2014-11-03 MED ORDER — BENZTROPINE MESYLATE 1 MG PO TABS
1.0000 mg | ORAL_TABLET | Freq: Two times a day (BID) | ORAL | Status: DC
  Administered 2014-11-03 – 2014-11-05 (×4): 1 mg via ORAL
  Filled 2014-11-03 (×8): qty 1

## 2014-11-03 MED ORDER — ALUM & MAG HYDROXIDE-SIMETH 200-200-20 MG/5ML PO SUSP
30.0000 mL | ORAL | Status: DC | PRN
Start: 2014-11-03 — End: 2014-11-10

## 2014-11-03 MED ORDER — LORAZEPAM 1 MG PO TABS
1.0000 mg | ORAL_TABLET | Freq: Two times a day (BID) | ORAL | Status: DC
  Administered 2014-11-03 – 2014-11-05 (×4): 1 mg via ORAL
  Filled 2014-11-03 (×4): qty 1

## 2014-11-03 MED ORDER — MAGNESIUM HYDROXIDE 400 MG/5ML PO SUSP: 30.0000 mL | Freq: Every day | ORAL | Status: DC | PRN

## 2014-11-03 MED ORDER — RISPERIDONE 2 MG PO TBDP
2.0000 mg | ORAL_TABLET | Freq: Two times a day (BID) | ORAL | Status: DC
  Administered 2014-11-03 – 2014-11-05 (×4): 2 mg via ORAL
  Filled 2014-11-03: qty 1
  Filled 2014-11-03: qty 2
  Filled 2014-11-03: qty 1
  Filled 2014-11-03: qty 2
  Filled 2014-11-03 (×4): qty 1

## 2014-11-03 NOTE — ED Notes (Signed)
Pt. Noted sleeping in room. No complaints or concerns voiced. No distress or abnormal behavior noted. Will continue to monitor with security cameras. Q 15 minute rounds continue. 

## 2014-11-03 NOTE — ED Notes (Signed)
Sitting in room fixing his snack

## 2014-11-03 NOTE — ED Provider Notes (Signed)
Dr. Elna BreslowEappen [facility]  Eber HongBrian Keshona Kartes, MD 11/03/14 [phone]

## 2014-11-03 NOTE — ED Notes (Signed)
Report received from Janie Rambo RN. Pt. Alert and oriented in no distress denies SI, HI, AVH and pain.  Pt. Instructed to come to me with problems or concerns.Will continue to monitor for safety via security cameras and Q 15 minute checks. 
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
## 2014-11-03 NOTE — [facility] Assessment (Signed)
[facility]  Referred to [facility] #[id number], received by Britta MccreedyBarbara.

## 2014-11-03 NOTE — ED Notes (Signed)
Resting quietly on bed.

## 2014-11-03 NOTE — ED Notes (Signed)
Laying in bed w/ eyes closed, PO fluids offered, pt with no verbal responses,  Medicated w/o difficulty

## 2014-11-03 NOTE — ED Notes (Addendum)
Resting quietly, laying in bed, PO fluids encouraged, crackers given.

## 2014-11-03 NOTE — BH Assessment (Signed)
[facility] Assessment Progress Note   Pt referral faxed to the following facilities, as beds available:  [facility]  No beds, but accepting to wait list, and referral faxed for review:  [facility]  TTS will continue to seek placement for the pt.  Casimer LaniusKristen Reis Pienta, MS, [facility] Therapeutic Triage Specialist [facility]

## 2014-11-03 NOTE — ED Notes (Signed)
Pt attempted to use the phone.  Pt still w/ minimal responses verbally, but is more verbally responsive.  PO fluids given.

## 2014-11-03 NOTE — ED Notes (Signed)
Up to the desk, patient more verbal/focused able to ask questions as well as respond.  Snack and juice given

## 2014-11-03 NOTE — Tx Team (Signed)
Initial Interdisciplinary Treatment Plan   PATIENT STRESSORS: Financial difficulties Health problems Medication change or noncompliance   PATIENT STRENGTHS: Capable of independent living Motivation for treatment/growth Supportive family/friends   PROBLEM LIST: Problem List/Patient Goals Date to be addressed Date deferred Reason deferred Estimated date of resolution  Psychosis      Delussion      "patient unable to communicate during admission process"                                           DISCHARGE CRITERIA:  Ability to meet basic life and health needs Improved stabilization in mood, thinking, and/or behavior Motivation to continue treatment in a less acute level of care Verbal commitment to aftercare and medication compliance  PRELIMINARY DISCHARGE PLAN: Attend PHP/IOP Outpatient therapy Return to previous living arrangement  PATIENT/FAMIILY INVOLVEMENT: This treatment plan has been presented to and reviewed with the patient, Cody [name], and/or family member. The patient and family have been given the opportunity to ask questions and make suggestions.  [name], [name] 11/03/2014, 11:19 PM

## 2014-11-03 NOTE — ED Notes (Signed)
Pt's mom into see 

## 2014-11-03 NOTE — ED Notes (Signed)
Dr Ross into see 

## 2014-11-03 NOTE — ED Notes (Signed)
Nad, resting quietly, PO fluids offered
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
## 2014-11-03 NOTE — ED Notes (Signed)
Pt. Noted in room. No complaints or concerns voiced. No distress or abnormal behavior noted. Will continue to monitor with security cameras. Q 15 minute rounds continue. 

## 2014-11-03 NOTE — ED Notes (Signed)
[id number]

## 2014-11-03 NOTE — Progress Notes (Signed)
Admission Note:  Patient is a 24 year old male brought in to ED IVCed by [employer]. Patient threatened to cut off family dog's head because the dog was talking to him and the dog feels that he is the king. During the admission, patient could not respond to questions nor signed the required papers. Presents a fixed mood and thought blocking. Patient cooperated with the admission process by following instructions with much encouragement.  A: Skin was assessed, no abnormalities noted apart from tattoos at the upper back, both hands and lower legs. POC and unit policies explained and understanding verbalized. Some consents obtained. Accepted food and fluids offered. R: Pt had no additional questions or concerns.

## 2014-11-03 NOTE — ED Notes (Signed)
Pt up in room, minimal verbal responses.  Pt appears to be responding to internal stimulus.PO fluids encouraged, pt did not eat breakfast. Support given.

## 2014-11-04 DIAGNOSIS — F29 Unspecified psychosis not due to a substance or known physiological condition: Secondary | ICD-10-CM

## 2014-11-04 MED ORDER — POTASSIUM CHLORIDE CRYS ER 20 MEQ PO TBCR
20.0000 meq | EXTENDED_RELEASE_TABLET | Freq: Every day | ORAL | Status: DC
  Administered 2014-11-04 – 2014-11-06 (×3): 20 meq via ORAL
  Filled 2014-11-04 (×7): qty 1

## 2014-11-04 MED ORDER — NICOTINE POLACRILEX 2 MG MT GUM
2.0000 mg | CHEWING_GUM | OROMUCOSAL | Status: DC | PRN
  Administered 2014-11-05: 2 mg via ORAL
  Filled 2014-11-04: qty 1

## 2014-11-04 NOTE — Progress Notes (Signed)
Did not attend group 

## 2014-11-04 NOTE — [facility] Suicide Risk Assessment (Signed)
[facility] Admission Suicide Risk Assessment   Nursing information obtained from:    Demographic factors:    Current Mental Status:    Loss Factors:    Historical Factors:    Risk Reduction Factors:    Total Time spent with patient: 45 minutes Principal Problem: Psychotic disorder Diagnosis:   Patient Active Problem List   Diagnosis Date Noted  . Psychotic disorder [F29] 05/16/2013     Continued Clinical Symptoms:  Alcohol Use Disorder Identification Test Final Score (AUDIT): 0 The "Alcohol Use Disorders Identification Test", Guidelines for Use in Primary Care, Second Edition.  World Science writer [facility]). Score between 0-7:  no or low risk or alcohol related problems. Score between 8-15:  moderate risk of alcohol related problems. Score between 16-19:  high risk of alcohol related problems. Score 20 or above:  warrants further diagnostic evaluation for alcohol dependence and treatment.   CLINICAL FACTORS:   Alcohol/Substance Abuse/Dependencies Schizophrenia:   Command hallucinatons Less than 61 years old Paranoid or undifferentiated type Currently Psychotic Unstable or Poor Therapeutic Relationship Previous Psychiatric Diagnoses and Treatments   Musculoskeletal: Strength & Muscle Tone: within normal limits Gait & Station: normal Patient leans: N/A  Psychiatric Specialty Exam: Physical Exam  ROS  Blood pressure 112/74, pulse 83, temperature 98.6 F (37 C), temperature source Oral, resp. rate 16, height  (1.651 m), weight 63.957 kg (141 lb), SpO2 98 %.Body mass index is 23.46 kg/(m^2).  General Appearance: Disheveled and Guarded  Eye Contact::  Poor  Speech:  Slow and mummbled  Volume:  Decreased  Mood:  Angry, Dysphoric and Irritable  Affect:  Blunt, Flat and Inappropriate  Thought Process:  Irrelevant, Loose and Tangential  Orientation:  Other:  oriented to name and place  Thought Content:  Delusions, Hallucinations: psychosis and Paranoid Ideation  Suicidal  Thoughts:  No  Homicidal Thoughts:  wanted to kill the family dog  Memory:  Immediate;   Poor Recent;   Poor Remote;   Poor  Judgement:  Impaired  Insight:  Lacking  Psychomotor Activity:  Mannerisms and Psychomotor Retardation  Concentration:  Poor  Recall:  Poor  Fund of Knowledge:Poor  Language: Fair  Akathisia:  No  Handed:  Right  AIMS (if indicated):     Assets:  Social Support  Sleep:     Cognition: Impaired,  Mild  ADL's:  Impaired     COGNITIVE FEATURES THAT CONTRIBUTE TO RISK:  Closed-mindedness, Loss of executive function, Polarized thinking and Thought constriction (tunnel vision)    SUICIDE RISK:   Moderate:  Frequent suicidal ideation with limited intensity, and duration, some specificity in terms of plans, no associated intent, good self-control, limited dysphoria/symptomatology, some risk factors present, and identifiable protective factors, including available and accessible social support.  PLAN OF CARE: Patient is 24 year old African-American man who was admitted due to severe psychosis, delusions, paranoia and having thoughts to kill the family dog.  He's been out of this medication for past 2 weeks.  He believed dog is trying to talk to him.  Patient is a poor historian and most of the information was obtained from electronic medical record.  Please see complete physical and history for more details.  Patient requires inpatient and psychiatric treatment   Medical Decision Making:  New problem, with additional work up planned, Review of Psycho-Social Stressors (1), Review or order clinical lab tests (1), Decision to obtain old records (1), Review and summation of old records (2), Established Problem, Worsening (2), Review of Medication Regimen &  Side Effects (2) and Review of New Medication or Change in Dosage (2)  I certify that inpatient services furnished can reasonably be expected to improve the patient's condition.   Mandie Crabbe T. 11/04/2014, 11:59 AM
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
## 2014-11-04 NOTE — Plan of Care (Signed)
Problem: Alteration in thought process Goal: STG-Patient does not respond to command hallucinations Outcome: Progressing Pt denies hallucinations. He remains guarded but does not appear to be responding on this writer's shift.

## 2014-11-04 NOTE — [facility] Group Notes (Signed)
[facility] Group Notes:  (Nursing/MHT/Case Management/Adjunct)  Date:  11/04/2014  Time:  11:18 AM  Type of Therapy:  Psychoeducational Skills  Participation Level:  Did Not Attend  Participation Quality:  Did Not Attend  Affect:  Did Not Attend  Cognitive:  Did Not Attend  Insight:  None  Engagement in Group:  Did Not Attend  Modes of Intervention:  Did Not Attend  Summary of Progress/Problems: Pt did not attend patient self inventory group.   Jacquelyne BalintForrest, Marguetta Windish Shanta 11/04/2014, 11:18 AM

## 2014-11-04 NOTE — [facility] Group Notes (Signed)
[facility] Group Notes: (Clinical Social Work)   11/04/2014      Type of Therapy:  Group Therapy   Participation Level:  Did Not Attend despite MHT prompting   Ambrose MantleMareida Grossman-Orr, LCSW 11/04/2014, 12:29 PM

## 2014-11-04 NOTE — H&P (Signed)
Psychiatric Admission Assessment Adult  Patient Identification: Cody [name]  MRN:  [id number]  Date of Evaluation:  11/04/2014  Chief Complaint:  PSYCHOTIC DISORDER NOS  Principal Diagnosis: <principal problem not specified>  Diagnosis:   Patient Active Problem List   Diagnosis Date Noted  . Psychotic disorder [F29] 05/16/2013   History of Present Illness: Cody [name] is a 24 year old African-American male. Admitted from the [facility] ED with complaints of worsening psychotic symptoms after being off of medications x 2 weeks. During this assessment; in a monotone, he reports, "I went to the [facility] ED yesterday with my family. Nothing was wrong with me. The doctor told me he was transferring me to this hospital. He did not really talk to me. All I know is I need the medicines I was on. My mother has them. I take them to help me go to sleep".  Objective: Cody [name] is alert, appears stated age. Hairs in dread. He is lying down on his back in bed.  He had the sheets over his entire body including his face. He minimally/reluctantly removed the sheet off of his face briefly after repeatedly prompting him to do so. He appears withdrawn, making no eye contacts. His speech is blocked, at times incoherent & other times, mumbled. Cody [name] is currently regarded a poor historian. Reports indicated he has been off of his psychotropic medications x 2 weeks. There was a report whereby he was trying or attempted to cut the head off of his family dog because he said the dog was talking to him. He appears to be in apparent physical distress.  Elements:  Location:  Psychotic disorder. Quality:  Thought blocking, monotone, mumbled speech. Severity:  Severe. Timing:  Acute. Duration:  2 weeks. Context:  Off of medications..  Associated Signs/Symptoms:  Depression Symptoms:  psychomotor retardation,  (Hypo) Manic Symptoms:  Unable to assess due to patient not responding to  questions  Anxiety Symptoms:  Objective observation reveals patient in no apparent distress   Psychotic Symptoms:  Paranoia,  PTSD Symptoms: Unable to assess, patient is not responding to instructions or questions.  Total Time spent with patient: 1 hour  Past Medical History:  Past Medical History  Diagnosis Date  . Bipolar 1 disorder   . Mood disorder    History reviewed. No pertinent past surgical history. Family History: History reviewed. No pertinent family history. Social History:  History  Alcohol Use  . Yes     History  Drug Use  . Yes  . Special: Marijuana    History   Social History  . Marital Status: Legally Separated    Spouse Name: N/A  . Number of Children: N/A  . Years of Education: N/A   Social History Main Topics  . Smoking status: Never Smoker   . Smokeless tobacco: Not on file  . Alcohol Use: Yes  . Drug Use: Yes    Special: Marijuana  . Sexual Activity: No   Other Topics Concern  . None   Social History Narrative   Additional Social History:  Musculoskeletal: Strength & Muscle Tone: within normal limits Gait & Station: normal Patient leans: N/A  Psychiatric Specialty Exam: Physical Exam  Constitutional: He appears well-developed.  HENT:  Head: Normocephalic.  Eyes: Pupils are equal, round, and reactive to light.  Neck: Normal range of motion.  Cardiovascular: Normal rate.   Respiratory: Effort normal.  GI: Soft.  Genitourinary:  Did not assess.  Musculoskeletal: Normal range of motion.  Neurological: He  is alert.  Skin: Skin is warm and dry.  Psychiatric: His affect is not angry. His speech is delayed and slurred (Mummbled ). He is slowed, withdrawn and actively hallucinating. Thought content is delusional. Cognition and memory are normal. He expresses inappropriate judgment.    Review of Systems  Constitutional: Negative.   HENT: Negative.   Eyes: Negative.   Respiratory: Negative.   Cardiovascular: Negative.    Gastrointestinal: Negative.   Genitourinary: Negative.   Musculoskeletal: Negative.   Skin: Negative.   Neurological: Negative.   Endo/Heme/Allergies: Negative.   Psychiatric/Behavioral: Positive for hallucinations and substance abuse (Cannabis abuse). Negative for depression, suicidal ideas and memory loss. The patient has insomnia. The patient is not nervous/anxious.     Blood pressure 112/74, pulse 83, temperature 98.6 F (37 C), temperature source Oral, resp. rate 16, height 5' 5"  (1.651 m), weight 63.957 kg (141 lb), SpO2 98 %.Body mass index is 23.46 kg/(m^2).  General Appearance: Casual  Eye Contact::  None  Speech:  Blocked, at times slow, slurred & or mumbled  Volume:  Decreased  Mood:  Negative  Affect:  Restricted  Thought Process:  Incoherence  Orientation:  Other:  oriented to name & family  Thought Content:  Paranoid Ideation and psychotic  Suicidal Thoughts:  Unable to assess  Homicidal Thoughts:  Unable to assess, patient not responding to questions or instructions  Memory:  Immediate;   Fair Recent;   Fair Remote;   Fair  Judgement:  Impaired  Insight:  Lacking  Psychomotor Activity:  Mannerisms and Psychomotor Retardation  Concentration:  Poor  Recall:  Poor  Fund of Knowledge:Poor  Language: Fair  Akathisia:  No  Handed:  Right  AIMS (if indicated):     Assets:  Desire for Improvement Physical Health Resilience  ADL's:  Impaired  Cognition: Impaired,  Mild  Sleep:      Risk to Self: Is patient at risk for suicide?: No Risk to Others: Yes  Prior Inpatient Therapy: Yes  Prior Outpatient Therapy:yes  Alcohol Screening: 1. How often do you have a drink containing alcohol?: Never 9. Have you or someone else been injured as a result of your drinking?: No 10. Has a relative or friend or a doctor or another health worker been concerned about your drinking or suggested you cut down?: No Alcohol Use Disorder Identification Test Final Score (AUDIT): 0 Brief  Intervention: Patient declined brief intervention  Allergies:   Allergies  Allergen Reactions  . Penicillins Hives   Lab Results:  Results for orders placed or performed during the hospital encounter of 11/02/14 (from the past 48 hour(s))  Urine Drug Screen     Status: None   Collection Time: 11/02/14  3:24 PM  Result Value Ref Range   Opiates NONE DETECTED NONE DETECTED   Cocaine NONE DETECTED NONE DETECTED   Benzodiazepines NONE DETECTED NONE DETECTED   Amphetamines NONE DETECTED NONE DETECTED   Tetrahydrocannabinol NONE DETECTED NONE DETECTED   Barbiturates NONE DETECTED NONE DETECTED    Comment:        DRUG SCREEN FOR MEDICAL PURPOSES ONLY.  IF CONFIRMATION IS NEEDED FOR ANY PURPOSE, NOTIFY LAB WITHIN 5 DAYS.        LOWEST DETECTABLE LIMITS FOR URINE DRUG SCREEN Drug Class       Cutoff (ng/mL) Amphetamine      1000 Barbiturate      200 Benzodiazepine   202 Tricyclics       334 Opiates  300 Cocaine          300 THC              50   Ethanol (ETOH)     Status: None   Collection Time: 11/02/14  3:37 PM  Result Value Ref Range   Alcohol, Ethyl (B) <5 <5 mg/dL    Comment:        LOWEST DETECTABLE LIMIT FOR SERUM ALCOHOL IS 11 mg/dL FOR MEDICAL PURPOSES ONLY   CBC     Status: Abnormal   Collection Time: 11/02/14  3:38 PM  Result Value Ref Range   WBC 10.8 (H) 4.0 - 10.5 K/uL   RBC 5.18 4.22 - 5.81 MIL/uL   Hemoglobin 15.7 13.0 - 17.0 g/dL   HCT 45.5 39.0 - 52.0 %   MCV 87.8 78.0 - 100.0 fL   MCH 30.3 26.0 - 34.0 pg   MCHC 34.5 30.0 - 36.0 g/dL   RDW 13.0 11.5 - 15.5 %   Platelets 193 150 - 400 K/uL  Comprehensive metabolic panel     Status: Abnormal   Collection Time: 11/02/14  3:38 PM  Result Value Ref Range   Sodium 138 135 - 145 mmol/L   Potassium 3.3 (L) 3.5 - 5.1 mmol/L   Chloride 100 (L) 101 - 111 mmol/L   CO2 26 22 - 32 mmol/L   Glucose, Bld 108 (H) 65 - 99 mg/dL   BUN 13 6 - 20 mg/dL   Creatinine, Ser 0.92 0.61 - 1.24 mg/dL   Calcium 9.8  8.9 - 10.3 mg/dL   Total Protein 8.2 (H) 6.5 - 8.1 g/dL   Albumin 5.2 (H) 3.5 - 5.0 g/dL   AST 59 (H) 15 - 41 U/L   ALT 21 17 - 63 U/L   Alkaline Phosphatase 54 38 - 126 U/L   Total Bilirubin 1.8 (H) 0.3 - 1.2 mg/dL   GFR calc non Af Amer >60 >60 mL/min   GFR calc Af Amer >60 >60 mL/min    Comment: (NOTE) The eGFR has been calculated using the CKD EPI equation. This calculation has not been validated in all clinical situations. eGFR's persistently <60 mL/min signify possible Chronic Kidney Disease.    Anion gap 12 5 - 15  Valproic acid level     Status: Abnormal   Collection Time: 11/02/14  3:39 PM  Result Value Ref Range   Valproic Acid Lvl <10 (L) 50.0 - 100.0 ug/mL    Comment: RESULTS CONFIRMED BY MANUAL DILUTION Performed at [facility]    Current Medications: Current Facility-Administered Medications  Medication Dose Route Frequency Provider Last Rate Last Dose  . acetaminophen (TYLENOL) tablet 650 mg  650 mg Oral Q6H PRN Dara Hoyer, PA-C      . alum & mag hydroxide-simeth (MAALOX/MYLANTA) 200-200-20 MG/5ML suspension 30 mL  30 mL Oral PRN Dara Hoyer, PA-C      . benztropine (COGENTIN) tablet 1 mg  1 mg Oral BID Dara Hoyer, PA-C   1 mg at 11/04/14 0954  . divalproex (DEPAKOTE) DR tablet 250 mg  250 mg Oral QHS Dara Hoyer, PA-C   250 mg at 11/03/14 2203  . hydrOXYzine (ATARAX/VISTARIL) tablet 50 mg  50 mg Oral QHS PRN Dara Hoyer, PA-C      . LORazepam (ATIVAN) tablet 1 mg  1 mg Oral BID Dara Hoyer, PA-C   1 mg at 11/04/14 7209  . magnesium hydroxide (MILK OF MAGNESIA) suspension 30 mL  30 mL Oral Daily PRN Dara Hoyer, PA-C      . nicotine (NICODERM CQ - dosed in mg/24 hours) patch 21 mg  21 mg Transdermal Daily Dara Hoyer, PA-C   21 mg at 11/04/14 0800  . ondansetron (ZOFRAN) tablet 4 mg  4 mg Oral Q8H PRN Dara Hoyer, PA-C      . potassium chloride SA (K-DUR,KLOR-CON) CR tablet 20 mEq  20 mEq Oral Daily Encarnacion Slates, NP       . risperiDONE (RISPERDAL M-TABS) disintegrating tablet 2 mg  2 mg Oral BID Dara Hoyer, PA-C   2 mg at 11/04/14 5053   PTA Medications: Prescriptions prior to admission  Medication Sig Dispense Refill Last Dose  . divalproex (DEPAKOTE) 250 MG DR tablet Take 1 tablet (250 mg total) by mouth at bedtime. 30 tablet 0 Past Month at Unknown time  . risperiDONE (RISPERDAL) 0.5 MG tablet Take 1 tablet (0.5 mg total) by mouth at bedtime. 30 tablet 0 Past Month at Unknown time   Previous Psychotropic Medications: Yes   Substance Abuse History in the last 12 months:  Yes.    Consequences of Substance Abuse: Medical Consequences:  Liver damage, Possible death by overdose Legal Consequences:  Arrests, jail time, Loss of driving privilege. Family Consequences:  Family discord, divorce and or separation.  Results for orders placed or performed during the hospital encounter of 11/02/14 (from the past 72 hour(s))  Urine Drug Screen     Status: None   Collection Time: 11/02/14  3:24 PM  Result Value Ref Range   Opiates NONE DETECTED NONE DETECTED   Cocaine NONE DETECTED NONE DETECTED   Benzodiazepines NONE DETECTED NONE DETECTED   Amphetamines NONE DETECTED NONE DETECTED   Tetrahydrocannabinol NONE DETECTED NONE DETECTED   Barbiturates NONE DETECTED NONE DETECTED    Comment:        DRUG SCREEN FOR MEDICAL PURPOSES ONLY.  IF CONFIRMATION IS NEEDED FOR ANY PURPOSE, NOTIFY LAB WITHIN 5 DAYS.        LOWEST DETECTABLE LIMITS FOR URINE DRUG SCREEN Drug Class       Cutoff (ng/mL) Amphetamine      1000 Barbiturate      200 Benzodiazepine   976 Tricyclics       734 Opiates          300 Cocaine          300 THC              50   Ethanol (ETOH)     Status: None   Collection Time: 11/02/14  3:37 PM  Result Value Ref Range   Alcohol, Ethyl (B) <5 <5 mg/dL    Comment:        LOWEST DETECTABLE LIMIT FOR SERUM ALCOHOL IS 11 mg/dL FOR MEDICAL PURPOSES ONLY   CBC     Status: Abnormal    Collection Time: 11/02/14  3:38 PM  Result Value Ref Range   WBC 10.8 (H) 4.0 - 10.5 K/uL   RBC 5.18 4.22 - 5.81 MIL/uL   Hemoglobin 15.7 13.0 - 17.0 g/dL   HCT 45.5 39.0 - 52.0 %   MCV 87.8 78.0 - 100.0 fL   MCH 30.3 26.0 - 34.0 pg   MCHC 34.5 30.0 - 36.0 g/dL   RDW 13.0 11.5 - 15.5 %   Platelets 193 150 - 400 K/uL  Comprehensive metabolic panel     Status: Abnormal   Collection Time: 11/02/14  3:38 PM  Result Value Ref Range   Sodium 138 135 - 145 mmol/L   Potassium 3.3 (L) 3.5 - 5.1 mmol/L   Chloride 100 (L) 101 - 111 mmol/L   CO2 26 22 - 32 mmol/L   Glucose, Bld 108 (H) 65 - 99 mg/dL   BUN 13 6 - 20 mg/dL   Creatinine, Ser 0.92 0.61 - 1.24 mg/dL   Calcium 9.8 8.9 - 10.3 mg/dL   Total Protein 8.2 (H) 6.5 - 8.1 g/dL   Albumin 5.2 (H) 3.5 - 5.0 g/dL   AST 59 (H) 15 - 41 U/L   ALT 21 17 - 63 U/L   Alkaline Phosphatase 54 38 - 126 U/L   Total Bilirubin 1.8 (H) 0.3 - 1.2 mg/dL   GFR calc non Af Amer >60 >60 mL/min   GFR calc Af Amer >60 >60 mL/min    Comment: (NOTE) The eGFR has been calculated using the CKD EPI equation. This calculation has not been validated in all clinical situations. eGFR's persistently <60 mL/min signify possible Chronic Kidney Disease.    Anion gap 12 5 - 15  Valproic acid level     Status: Abnormal   Collection Time: 11/02/14  3:39 PM  Result Value Ref Range   Valproic Acid Lvl <10 (L) 50.0 - 100.0 ug/mL    Comment: RESULTS CONFIRMED BY MANUAL DILUTION Performed at [facility]     Observation Level/Precautions:  15 minute checks  Laboratory:  Per ED  Psychotherapy: Group sessions  Medications: See Active medication lists    Consultations:  As needed  Discharge Concerns: Mood stability, Animal/human safety    Estimated LOS: 5-7 days  Other:     Psychological Evaluations: Yes   Treatment Plan Summary: Daily contact with patient to assess and evaluate symptoms and progress in treatment and Medication management: 1. Admit for crisis  management and stabilization, estimated length of stay 3-5 days.  2. Medication management to reduce current symptoms to base line and improve the patient's overall level of functioning: Continue current treatment plan already in progress; Depakote 250 mg for mood stabilization, Hydroxyzine 50 mg for anxiety, Lorazepam 1 mg for severe anxiety, Risperdal disintegrating tablet 2 mg for mood control, Nicotine patch 21 mg for nicotine dependence & Cogentin 1 mg for EPS.  3. Treat health problems as indicated: initiate Kdur 20 meq daily x 5 days for low potassium  4. Develop treatment plan to decrease risk of relapse upon discharge and the need for readmission.  5. Psycho-social education regarding relapse prevention and self care.  6. Health care follow up as needed for medical problems.  7. Review, reconcile, and reinstate any pertinent home medications for other health issues where appropriate. 8. Call for consults with hospitalist for any additional specialty patient care services as needed.  Medical Decision Making:  New problem, with additional work up planned, Review of Psycho-Social Stressors (1), Review or order clinical lab tests (1), Review and summation of old records (2), Review of Medication Regimen & Side Effects (2) and Review of New Medication or Change in Dosage (2)  I certify that inpatient services furnished can reasonably be expected to improve the patient's condition.   Lindell Spar I, PMHNP-BC 5/22/201610:22 AM   I agreed with the findings, treatment and disposition plan of this patient. Berniece Andreas, MD

## 2014-11-04 NOTE — Progress Notes (Signed)
Patient ID: Cody [name], male   DOB: 08/18/1990, 24 y.o.   MRN: [id number]   D: Pt has been very flat and depressed on the unit today. Pt has been in the bed most of the day, he did not attend groups nor did he engage in treatment. Pt would not talk to staff, nor his peers. Pt did take medications without any problems.  Pt reported being negative SI/HI, no AH/VH noted. A: 15 min checks continued for patient safety. R: Pt safety maintained.

## 2014-11-05 DIAGNOSIS — F259 Schizoaffective disorder, unspecified: Secondary | ICD-10-CM | POA: Diagnosis present

## 2014-11-05 DIAGNOSIS — E876 Hypokalemia: Secondary | ICD-10-CM | POA: Diagnosis present

## 2014-11-05 DIAGNOSIS — F250 Schizoaffective disorder, bipolar type: Secondary | ICD-10-CM | POA: Diagnosis present

## 2014-11-05 MED ORDER — TRAZODONE HCL 50 MG PO TABS
50.0000 mg | ORAL_TABLET | Freq: Every day | ORAL | Status: DC
  Administered 2014-11-05: 50 mg via ORAL
  Filled 2014-11-05 (×3): qty 1

## 2014-11-05 MED ORDER — BENZTROPINE MESYLATE 0.5 MG PO TABS
0.5000 mg | ORAL_TABLET | Freq: Every day | ORAL | Status: DC
  Administered 2014-11-06 – 2014-11-10 (×5): 0.5 mg via ORAL
  Filled 2014-11-05 (×7): qty 1

## 2014-11-05 MED ORDER — BENZTROPINE MESYLATE 1 MG PO TABS: 1.0000 mg | ORAL_TABLET | Freq: Every day | ORAL | Status: DC

## 2014-11-05 MED ORDER — LITHIUM CARBONATE 300 MG PO CAPS
300.0000 mg | ORAL_CAPSULE | Freq: Two times a day (BID) | ORAL | Status: DC
  Administered 2014-11-05 – 2014-11-10 (×10): 300 mg via ORAL
  Filled 2014-11-05 (×14): qty 1

## 2014-11-05 MED ORDER — RISPERIDONE 2 MG PO TBDP
2.0000 mg | ORAL_TABLET | Freq: Every day | ORAL | Status: DC
  Administered 2014-11-06: 2 mg via ORAL
  Filled 2014-11-05 (×3): qty 1

## 2014-11-05 MED ORDER — NICOTINE 21 MG/24HR TD PT24
21.0000 mg | MEDICATED_PATCH | Freq: Every day | TRANSDERMAL | Status: DC
  Administered 2014-11-05 – 2014-11-07 (×3): 21 mg via TRANSDERMAL
  Filled 2014-11-05 (×6): qty 1

## 2014-11-05 MED ORDER — RISPERIDONE 1 MG PO TBDP
3.0000 mg | ORAL_TABLET | Freq: Every day | ORAL | Status: DC
  Administered 2014-11-05 – 2014-11-07 (×3): 3 mg via ORAL
  Filled 2014-11-05 (×5): qty 3

## 2014-11-05 MED ORDER — LORAZEPAM 0.5 MG PO TABS
0.5000 mg | ORAL_TABLET | Freq: Two times a day (BID) | ORAL | Status: DC
  Administered 2014-11-05 – 2014-11-06 (×2): 0.5 mg via ORAL
  Filled 2014-11-05 (×2): qty 1

## 2014-11-05 MED ORDER — BENZTROPINE MESYLATE 0.5 MG PO TABS
0.5000 mg | ORAL_TABLET | Freq: Every day | ORAL | Status: DC
  Administered 2014-11-05 – 2014-11-09 (×5): 0.5 mg via ORAL
  Filled 2014-11-05 (×7): qty 1

## 2014-11-05 NOTE — Progress Notes (Signed)
D. Pt has been in room much of the evening, appears withdrawn and has some thought blocking. Pt provided minimal interaction when staff attempted to engage in conversation with pt. Pt did receive medications without incident this evening, did not verbalize any complaints but appears to have impaired judgment at this stage in his treatment. A. Support and encouragement provided. R. Safety maintained, will continue to monitor.

## 2014-11-05 NOTE — Progress Notes (Signed)
Patient in bed for the most part of the evening sleeping. Writer woke patient up to come for his bedtime medications. Patient came to the medication window and took his medications. He denied SI/HI and denied Hallucinations. He appeared sad and depressed. Writer offered patient his bedtime snacks too. Patient very soft spoken, and didn't say much. Q 15 minute check continues as ordered to maintain safety.

## 2014-11-05 NOTE — Progress Notes (Signed)
Patient ID: Cody [name], male   DOB: 12/17/1990, 24 y.o.   MRN: [id number]  EKG done, shown to MD Eappen, and placed on chart.

## 2014-11-05 NOTE — Plan of Care (Signed)
Problem: Alteration in thought process Goal: STG-Patient is able to follow short directions Outcome: Progressing Patient is able to follow short directions with prompting.     

## 2014-11-05 NOTE — Progress Notes (Signed)
[facility] MD Progress Note  11/05/2014 2:30 PM Cody [name]  MRN:  [id number] Subjective:  Patient states " I am upset today since I could not get my rest last night.'   Objective: Cody [name] is a 24 year old African-American male. Ad[facility] with complaints of worsening psychotic symptoms after being off of medications x 2 weeks. Patient seen and chart reviewed.Discussed patient with treatment team.  Patient seen in room, is isolative , withdrawn , guarded , kept his eyes closed during the evaluation. Talked in monosyllables 'yes, no, may be , I am upset ."  Patient per staff has been withdrawn since admission, does not communicate a lot, appeared irritable when questions were asked as part of daily assessment. Pt reported poor sleep. Pt did not express any SI/HI/AH/VH today , unknown if he has it since he is not cooperative with evaluation. Discussed with pt about increasing his medications today , he agreed with plan.       Principal Problem: Schizoaffective disorder, bipolar type Diagnosis:   Patient Active Problem List   Diagnosis Date Noted  . Schizoaffective disorder, bipolar type [F25.0] 11/05/2014  . Hypokalemia [E87.6] 11/05/2014   Total Time spent with patient: 30 minutes   Past Medical History:  Past Medical History  Diagnosis Date  . Bipolar 1 disorder   . Mood disorder    History reviewed. No pertinent past surgical history. Family History: History reviewed. No pertinent family history. Social History:  History  Alcohol Use  . Yes     History  Drug Use  . Yes  . Special: Marijuana    History   Social History  . Marital Status: Legally Separated    Spouse Name: N/A  . Number of Children: N/A  . Years of Education: N/A   Social History Main Topics  . Smoking status: Never Smoker   . Smokeless tobacco: Not on file  . Alcohol Use: Yes  . Drug Use: Yes    Special: Marijuana  . Sexual Activity: No   Other Topics Concern  .  None   Social History Narrative   Additional History:    Sleep: Poor  Appetite:  Poor      Musculoskeletal: Strength & Muscle Tone: within normal limits Gait & Station: normal Patient leans: N/A   Psychiatric Specialty Exam: Physical Exam  Review of Systems  Unable to perform ROS: mental acuity    Blood pressure 104/76, pulse 104, temperature 98.9 F (37.2 C), temperature source Oral, resp. rate 17, height 5\' 5"  (1.651 m), weight 63.957 kg (141 lb), SpO2 98 %.Body mass index is 23.46 kg/(m^2).  General Appearance: Disheveled  Eye SolicitorContact::  None  Speech:  Slow  Volume:  Decreased  Mood:  Anxious and Irritable  Affect:  Restricted  Thought Process:  Disorganized  Orientation:  Other:  to self,person  Thought Content:  Paranoid Ideation  Suicidal Thoughts:  did not express any  Homicidal Thoughts:  did not express any  Memory:  Immediate;   unable to assess Recent;   unable to assess Remote;   unable to assess  Judgement:  Impaired  Insight:  Shallow  Psychomotor Activity:  Decreased  Concentration:  Poor  Recall:  [employer]  Fund of Knowledge:Poor  Language: Fair  Akathisia:  No  Handed:  Right  AIMS (if indicated):     Assets:  Others:  access to health care  ADL's:  Impaired  Cognition: Impaired, unable to assess  Sleep:  Current Medications: Current Facility-Administered Medications  Medication Dose Route Frequency Provider Last Rate Last Dose  . acetaminophen (TYLENOL) tablet 650 mg  650 mg Oral Q6H PRN Court Joy, PA-C      . alum & mag hydroxide-simeth (MAALOX/MYLANTA) 200-200-20 MG/5ML suspension 30 mL  30 mL Oral PRN Court Joy, PA-C      . Melene Muller ON 11/06/2014] benztropine (COGENTIN) tablet 0.5 mg  0.5 mg Oral Daily Mishika Flippen, MD      . benztropine (COGENTIN) tablet 0.5 mg  0.5 mg Oral QHS Liese Dizdarevic, MD      . hydrOXYzine (ATARAX/VISTARIL) tablet 50 mg  50 mg Oral QHS PRN Court Joy, PA-C   50 mg at 11/04/14 2040  .  lithium carbonate capsule 300 mg  300 mg Oral BID WC Pride Gonzales, MD      . LORazepam (ATIVAN) tablet 0.5 mg  0.5 mg Oral BID Tyland Klemens, MD      . magnesium hydroxide (MILK OF MAGNESIA) suspension 30 mL  30 mL Oral Daily PRN Court Joy, PA-C      . nicotine polacrilex (NICORETTE) gum 2 mg  2 mg Oral PRN Jomarie Longs, MD      . ondansetron (ZOFRAN) tablet 4 mg  4 mg Oral Q8H PRN Court Joy, PA-C      . potassium chloride SA (K-DUR,KLOR-CON) CR tablet 20 mEq  20 mEq Oral Daily Sanjuana Kava, NP   20 mEq at 11/05/14 0755  . [START ON 11/06/2014] risperiDONE (RISPERDAL M-TABS) disintegrating tablet 2 mg  2 mg Oral Daily Braven Wolk, MD      . risperiDONE (RISPERDAL M-TABS) disintegrating tablet 3 mg  3 mg Oral QHS Devon Kingdon, MD      . traZODone (DESYREL) tablet 50 mg  50 mg Oral QHS Jomarie Longs, MD        Lab Results: No results found for this or any previous visit (from the past 48 hour(s)).  Physical Findings: AIMS:  , ,  ,  ,    CIWA:    COWS:     Assessment:Patient is a 24 year old AAM with hx of schizoaffective do , presented disorganized after he wanted to slit his dog's throat , felt the dog was talking to him. Pt is very guarded, irritable as well as isolative and is a poor historian. Per review of EHR -pt was seen by our consult team in ED several times and was also referred to [facility] in the past.  Pt will benefit from inpatient stay.    Treatment Plan Summary: Daily contact with patient to assess and evaluate symptoms and progress in treatment and Medication management Will increase Risperdal M tab to 5 mg po daily for psychosis. Will continue Cogentin 0.5 mg po bid for EPS. Will add Trazodone 50 mg po qhs for sleep. Continue Kdur daily for hypokalemia .Repeat BMP tomorrow AM. Will add Lithium 300 mg po bid for mood lability- EKG reviewed - pt with Twave abnormalities - unchanged from previous EKG . Will order TSH. Will reduce Ativan to 0.5 mg po  bid for now, will taper off slowly . CSW will work on disposition. Pt encouraged to attend groups .     Medical Decision Making:  Review of Psycho-Social Stressors (1), Review or order clinical lab tests (1), Established Problem, Worsening (2), Review of Last Therapy Session (1), Review of Medication Regimen & Side Effects (2) and Review of New Medication or Change in  Dosage (2)     Tirzah Fross MD 11/05/2014, 2:30 PM

## 2014-11-05 NOTE — [facility] Group Notes (Signed)
[facility]   11/05/2014 10:37 AM  Participation Quality:  Invited.  Was in room with covers pulled over his head.  Chose to not attend    Cody [name], Cody [name]

## 2014-11-05 NOTE — Progress Notes (Signed)
Patient ID: Cody LindenAnthony L Balthaser, male   DOB: [birth year], 24 y.o.   MRN: [id number]  DAR: Pt. Denies SI/HI and A/V Hallucinations however patient appears to be preoccupied and reports he is, "trying to process." Patient does appear to have thought blocking. Patient reports appetite is good, energy level is low, and concentration level is poor. Patient rates his depression 9/10, hopelessness 10/10, and anxiety 2/10. Patient does not report any pain or discomfort at this time. Support and encouragement provided to the patient. Scheduled medications administered to patient per physician's orders. EKG was done and placed on chart. Patient is minimal and isolative but does cooperate when asked. Patient normally stays in his room and is not seen attending groups however he was seen in the dayroom this afternoon briefly watching television. Q15 minute checks are maintained for safety.

## 2014-11-05 NOTE — B[facility] Group Notes (Signed)
[facility] LCSW Group Therapy  11/05/2014 1:15 pm  Type of Therapy: Process Group Therapy  Participation Level: Invited  Chose to not attend  Summary of Progress/Problems: Today's group addressed the issue of overcoming obstacles.  Patients were asked to identify their biggest obstacle post d/c that stands in the way of their on-going success, and then problem solve as to how to manage this.    Ida Rogueorth, Chivonne Rascon B 11/05/2014   3:18 PM
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
## 2014-11-05 NOTE — Progress Notes (Signed)
Patient lacks capacity to participate in disposition planning.  Carah Barrientes ,MD Attending Psychiatrist  [facility]  

## 2014-11-06 ENCOUNTER — Encounter (HOSPITAL_COMMUNITY): Payer: Self-pay | Admitting: Psychiatry

## 2014-11-06 LAB — BASIC METABOLIC PANEL
ANION GAP: 7 (ref 5–15)
BUN: 13 mg/dL (ref 6–20)
CALCIUM: 9.3 mg/dL (ref 8.9–10.3)
CHLORIDE: 106 mmol/L (ref 101–111)
CO2: 26 mmol/L (ref 22–32)
CREATININE: 0.87 mg/dL (ref 0.61–1.24)
GFR calc non Af Amer: >60 mL/min (ref >60)
Glucose, Bld: 88 mg/dL (ref 65–99)
Potassium: 4.1 mmol/L (ref 3.5–5.1)
SODIUM: 139 mmol/L (ref 135–145)

## 2014-11-06 LAB — TSH: TSH: 0.736 u[IU]/mL (ref 0.350–4.500)

## 2014-11-06 MED ORDER — LORAZEPAM 1 MG PO TABS
1.0000 mg | ORAL_TABLET | Freq: Once | ORAL | Status: AC
  Administered 2014-11-06: 1 mg via ORAL
  Filled 2014-11-06: qty 1

## 2014-11-06 MED ORDER — TRAZODONE HCL 100 MG PO TABS
100.0000 mg | ORAL_TABLET | Freq: Every day | ORAL | Status: DC
  Administered 2014-11-06 – 2014-11-07 (×2): 100 mg via ORAL
  Filled 2014-11-06 (×6): qty 1

## 2014-11-06 MED ORDER — RISPERIDONE 2 MG PO TBDP
2.0000 mg | ORAL_TABLET | Freq: Every day | ORAL | Status: DC
  Administered 2014-11-07 – 2014-11-10 (×4): 2 mg via ORAL
  Filled 2014-11-06 (×6): qty 1

## 2014-11-06 MED ORDER — OLANZAPINE 5 MG PO TBDP
5.0000 mg | ORAL_TABLET | Freq: Three times a day (TID) | ORAL | Status: DC | PRN
  Administered 2014-11-06 – 2014-11-07 (×2): 5 mg via ORAL
  Filled 2014-11-06 (×2): qty 1

## 2014-11-06 MED ORDER — HYDROXYZINE HCL 50 MG PO TABS: 50.0000 mg | ORAL_TABLET | ORAL | Status: DC | PRN

## 2014-11-06 NOTE — [facility] Group Notes (Signed)
[facility] LCSW Group Therapy  11/06/2014 , 12:18 PM   Type of Therapy:  Group Therapy  Participation Level:  Active  Participation Quality:  Attentive  Affect:  Appropriate  Cognitive:  Alert  Insight:  Improving  Engagement in Therapy:  Engaged  Modes of Intervention:  Discussion, Exploration and Socialization  Summary of Progress/Problems: Today's group focused on the term Diagnosis.  Participants were asked to define the term, and then pronounce whether it is a negative, positive or neutral term.  Ethelene Brownsnthony reluctantly agreed to come to group after I made him aware that the Dr asks me everyday if he attended group as a sign of progress.  He stayed briefly.  It was fairly chaotic, and he left after 8 minutes.  Daryel Geraldorth, Houda Brau B 11/06/2014 , 12:18 PM

## 2014-11-06 NOTE — Plan of Care (Signed)
Problem: Ineffective individual coping Goal: STG: Patient will remain free from self harm Outcome: Progressing Patient remains free from self harm.      
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
## 2014-11-06 NOTE — BHH Group Notes (Signed)
BHH Group Notes:  (Nursing/MHT/Case Management/Adjunct)  Date:  11/06/2014  Time:  9:52 AM  Type of Therapy:  Nurse Education  Participation Level:  Did Not Attend  Participation Quality:  Did not attend  Affect:  Did not attend  Cognitive:  Did not attend  Insight:  None  Engagement in Group:  None  Modes of Intervention:  Discussion and Education  Summary of Progress/Problems: The purposed of this group is to discuss the topic of the day which is recovery. Daily booklets were given to patients and patients were to verbalized what recovery means to them. Patient did not attend group.  Marzetta BoardDopson, Loyal Rudy E 11/06/2014, 9:52 AM

## 2014-11-06 NOTE — Progress Notes (Addendum)
Patient ID: Cody [name], male   DOB: 1990/11/13, 24 y.o.   MRN: [id number]  DAR: Pt. Denies SI/HI and A/V Hallucinations to this writer but patient appears to still be presenting with thought blocking. Patient does not report any pain or discomfort at this time. Patient's last BM was 10/03/14. Writer spoke to patient about this and offered medication to relieve this. Patient has yet to request any medication. Writer spoke to patient about drinking plenty of fluids to aide in digestion. Support and encouragement provided to the patient to follow treatment plan. Scheduled medications administered to patient per physician's orders. Patient continues to minimal and isolative. Patient is seen minimally in the milieu and is very reluctant to participate and attend group. Q15 minute checks are maintained for safety.

## 2014-11-06 NOTE — Progress Notes (Addendum)
Patient ID: Cody [name], male   DOB: 04-22-1991, 24 y.o.   MRN: [id number]  Patient refused any intervention to aid in having a bowel movement. Patient reports this does occur sometimes. Patient advised to use an intervention but patient again refused.

## 2014-11-06 NOTE — Progress Notes (Signed)
[facility] MD Progress Note  11/06/2014 3:30 PM Cody [name]  MRN:  [id number] Subjective:  Patient states " I still have sleep issues".   Objective: Cody [name] is a 24 year old African-American male. Admitted from the [facility] ED with complaints of worsening psychotic symptoms after being off of medications x 2 weeks. Patient seen and chart reviewed.Discussed patient with treatment team.   Patient seen in room, is isolative , withdrawn , continues to be guarded.  Pt today denied any AH/VH - reports having sleep issues.  Patient per staff continues be withdrawn , does not communicate a lot, has been compliant with medications. No disruptive issues noted on the unit.       Principal Problem: Schizoaffective disorder, bipolar type Diagnosis:   Patient Active Problem List   Diagnosis Date Noted  . Schizoaffective disorder, bipolar type [F25.0] 11/05/2014  . Hypokalemia [E87.6] 11/05/2014   Total Time spent with patient: 30 minutes   Past Medical History:  Past Medical History  Diagnosis Date  . Bipolar 1 disorder   . Mood disorder    History reviewed. No pertinent past surgical history. Family History: History reviewed. No pertinent family history. Social History:  History  Alcohol Use  . Yes     History  Drug Use  . Yes  . Special: Marijuana    History   Social History  . Marital Status: Legally Separated    Spouse Name: N/A  . Number of Children: N/A  . Years of Education: N/A   Social History Main Topics  . Smoking status: Never Smoker   . Smokeless tobacco: Not on file  . Alcohol Use: Yes  . Drug Use: Yes    Special: Marijuana  . Sexual Activity: No   Other Topics Concern  . None   Social History Narrative   Additional History:    Sleep: Poor  Appetite:  Poor      Musculoskeletal: Strength & Muscle Tone: within normal limits Gait & Station: normal Patient leans: N/A   Psychiatric Specialty Exam: Physical Exam  Review of Systems   Psychiatric/Behavioral: The patient has insomnia.   All other systems reviewed and are negative.   Blood pressure 96/67, pulse 116, temperature 98.7 F (37.1 C), temperature source Oral, resp. rate 16, height _0  (1.651 m), weight 63.957 kg (141 lb), SpO2 98 %.Body mass index is 23.46 kg/(m^2).  General Appearance: Disheveled  Eye Sport and exercise psychologist::  None  Speech:  Slow  Volume:  Decreased  Mood:  Anxious and Irritable improving  Affect:  Restricted  Thought Process:  Disorganized improving  Orientation:  Full (Time, Place, and Person)  Thought Content:  Paranoid Ideation  Suicidal Thoughts:  No  Homicidal Thoughts:  No  Memory:  Immediate;   Fair Recent;   Fair Remote;   Fair  Judgement:  Impaired  Insight:  Shallow  Psychomotor Activity:  Decreased  Concentration:  Poor  Recall:  [employer] of Knowledge:Poor  Language: Fair  Akathisia:  No  Handed:  Right  AIMS (if indicated):     Assets:  Others:  access to health care  ADL's:  Impaired  Cognition: wnl  Sleep:  Number of Hours: 6     Current Medications: Current Facility-Administered Medications  Medication Dose Route Frequency Provider Last Rate Last Dose  . acetaminophen (TYLENOL) tablet 650 mg  650 mg Oral Q6H PRN Dara Hoyer, PA-C   650 mg at 11/06/14 4098  . alum & mag hydroxide-simeth (MAALOX/MYLANTA) 200-200-20 MG/5ML  suspension 30 mL  30 mL Oral PRN Dara Hoyer, PA-C      . benztropine (COGENTIN) tablet 0.5 mg  0.5 mg Oral Daily Ursula Alert, MD   0.5 mg at 11/06/14 0932  . benztropine (COGENTIN) tablet 0.5 mg  0.5 mg Oral QHS Ursula Alert, MD   0.5 mg at 11/05/14 2134  . hydrOXYzine (ATARAX/VISTARIL) tablet 50 mg  50 mg Oral Q4H PRN Ursula Alert, MD      . lithium carbonate capsule 300 mg  300 mg Oral BID WC Ursula Alert, MD   300 mg at 11/06/14 6712  . magnesium hydroxide (MILK OF MAGNESIA) suspension 30 mL  30 mL Oral Daily PRN Dara Hoyer, PA-C      . nicotine (NICODERM CQ - dosed in mg/24  hours) patch 21 mg  21 mg Transdermal Daily Ursula Alert, MD   21 mg at 11/06/14 0840  . ondansetron (ZOFRAN) tablet 4 mg  4 mg Oral Q8H PRN Dara Hoyer, PA-C      . risperiDONE (RISPERDAL M-TABS) disintegrating tablet 2 mg  2 mg Oral Daily Ursula Alert, MD   2 mg at 11/06/14 4580  . risperiDONE (RISPERDAL M-TABS) disintegrating tablet 3 mg  3 mg Oral QHS Ursula Alert, MD   3 mg at 11/05/14 2133  . traZODone (DESYREL) tablet 100 mg  100 mg Oral QHS Ursula Alert, MD        Lab Results:  Results for orders placed or performed during the hospital encounter of 11/03/14 (from the past 48 hour(s))  TSH     Status: None   Collection Time: 11/06/14  6:39 AM  Result Value Ref Range   TSH 0.736 0.350 - 4.500 uIU/mL    Comment: Performed at [facility] metabolic panel     Status: None   Collection Time: 11/06/14  6:39 AM  Result Value Ref Range   Sodium 139 135 - 145 mmol/L   Potassium 4.1 3.5 - 5.1 mmol/L   Chloride 106 101 - 111 mmol/L   CO2 26 22 - 32 mmol/L   Glucose, Bld 88 65 - 99 mg/dL   BUN 13 6 - 20 mg/dL   Creatinine, Ser 0.87 0.61 - 1.24 mg/dL   Calcium 9.3 8.9 - 10.3 mg/dL   GFR calc non Af Amer >60 >60 mL/min   GFR calc Af Amer >60 >60 mL/min    Comment: (NOTE) The eGFR has been calculated using the CKD EPI equation. This calculation has not been validated in all clinical situations. eGFR's persistently <60 mL/min signify possible Chronic Kidney Disease.    Anion gap 7 5 - 15    Comment: Performed at [facility]    Physical Findings: AIMS:  , ,  ,  ,    CIWA:    COWS:     Assessment:Patient is a 24 year old AAM with hx of schizoaffective do , presented disorganized after he wanted to slit his dog's throat , felt the dog was talking to him. Pt is very guarded, irritable as well as isolative and is a poor historian. Per review of EHR -pt was seen by our consult team in ED several times and was also referred to [facility] in  the past.  Pt today with continued negative sx as well as poor sleep. Pt will continue to benefit from treatment.    Treatment Plan Summary: Daily contact with patient to assess and evaluate symptoms and progress in treatment  and Medication management Will continue Risperdal M tab  5 mg po daily for psychosis. Will continue Cogentin 0.5 mg po bid for EPS. Will increase Trazodone to 100 mg po qhs for sleep. BMP reviewed - wnl. Will continue Lithium 300 mg po bid for mood lability- TSH ( 11/06/14) - WNL - Li level on 11/09/14. Will DC Ativan , continue Vistaril 25 mg po q6hpo prn for anxiety sx.  CSW will work on disposition. Pt encouraged to attend groups .     Medical Decision Making:  Review of Psycho-Social Stressors (1), Review or order clinical lab tests (1), Established Problem, Worsening (2), Review of Last Therapy Session (1), Review of Medication Regimen & Side Effects (2) and Review of New Medication or Change in Dosage (2)     Rafal Archuleta MD 11/06/2014, 3:30 PM

## 2014-11-07 MED ORDER — NICOTINE POLACRILEX 2 MG MT GUM
2.0000 mg | CHEWING_GUM | OROMUCOSAL | Status: DC | PRN
Start: 2014-11-07 — End: 2014-11-10
  Administered 2014-11-07 (×2): 2 mg via ORAL
  Filled 2014-11-07: qty 1

## 2014-11-07 MED ORDER — RISPERIDONE 1 MG PO TBDP
1.0000 mg | ORAL_TABLET | Freq: Every day | ORAL | Status: DC
  Administered 2014-11-07 – 2014-11-10 (×4): 1 mg via ORAL
  Filled 2014-11-07 (×5): qty 1

## 2014-11-07 MED ORDER — BENZTROPINE MESYLATE 0.5 MG PO TABS
0.5000 mg | ORAL_TABLET | Freq: Every day | ORAL | Status: DC
  Administered 2014-11-07 – 2014-11-10 (×4): 0.5 mg via ORAL
  Filled 2014-11-07 (×5): qty 1

## 2014-11-07 MED ORDER — GABAPENTIN 100 MG PO CAPS
200.0000 mg | ORAL_CAPSULE | Freq: Two times a day (BID) | ORAL | Status: DC
  Administered 2014-11-07 – 2014-11-08 (×3): 200 mg via ORAL
  Filled 2014-11-07 (×8): qty 2

## 2014-11-07 NOTE — Progress Notes (Signed)
Did not attend group 
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
## 2014-11-07 NOTE — Progress Notes (Signed)
Patient in bed at the beginning of the shift. He denied SI/HI and denied Hallucinations. His mood and affects blunted and depressed. Patient did no attend group and he is not interacting. He isolates to his room. Patient came to the nursing station and asked writer to open the quiet room door for him, Writer told patient the room is reserved to patient that are having difficulty with room mates and since he is the one in his room there is no need for him going into the quiet room.  Encouraged and supported patient. Q 15 minute check continues as ordered to maintain safety.

## 2014-11-07 NOTE — Progress Notes (Signed)
Patient ID: Cody [name], male   DOB: 1990/11/30, 24 y.o.   MRN: [id number]  DAR: When writer assumed care of patient this morning he was in the quiet room however shortly after patient came out, received breakfast, medications, and allowed MHT to receive vital signs. Pt. Denies SI/HI and A/V Hallucinations however continues to be preoccupied and thought blocking. Patient does not report any pain or discomfort at this time. Support and encouragement provided to the patient to let staff know if he is feeling aggravate or agitated. Patient verbalized understanding but denies when asked throughout the day. Scheduled medications administered to patient per physician's orders. Patient continues to be isolative and minimal but did go to afternoon social work group for about five minutes. Patient answers question when directly asked but otherwise does not initiate in conversation. Q15 minute checks are maintained for safety.

## 2014-11-07 NOTE — [facility] Group Notes (Signed)
[facility]  11/07/2014 , 1:45 PM    Type of Therapy:  [employer] Presentation  Participation Level:  Invited.  Came for 5 minutes.  Left.  Did not return.  Summary of Progress/Problems:  Onalee HuaDavid from [employer] came to present his recovery story and play the guitar.    Daryel Geraldorth, Henny Strauch B 11/07/2014 , 1:45 PM

## 2014-11-07 NOTE — Plan of Care (Signed)
Problem: Ineffective individual coping Goal: STG-Increase in ability to manage activities of daily living Outcome: Not Progressing Patient remains disheveled and stays in his bed most of the day.
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
## 2014-11-08 MED ORDER — GABAPENTIN 100 MG PO CAPS
200.0000 mg | ORAL_CAPSULE | Freq: Three times a day (TID) | ORAL | Status: DC
  Administered 2014-11-08 – 2014-11-10 (×6): 200 mg via ORAL
  Filled 2014-11-08 (×11): qty 2

## 2014-11-08 MED ORDER — RISPERIDONE 2 MG PO TBDP
3.5000 mg | ORAL_TABLET | Freq: Every day | ORAL | Status: DC
  Administered 2014-11-09 (×2): 3.5 mg via ORAL
  Filled 2014-11-08 (×4): qty 1
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
## 2014-11-08 NOTE — Progress Notes (Signed)
D: Pt presents with flat affect, minimal interaction. Pt isolates in his room and did not attend morning group. Pt denies suicidal thoughts. Denies homicidal thoughts. No AVH or paranoia verbalized by pt. Pt compliant with taking meds. No adverse reaction to meds verbalized by pt.  Pt do not appear irritable this morning and noted to be less anxious. Pt compliant with staying on the unit for meals and during rec therapy off the unit. Pt hygiene appropriate for situation.  A: Medications administered as ordered per MD. Verbal support given. Pt encouraged to attend groups. 15 minute checks performed for safety. Pt restricted to the unit for safety.  R: Pt stated goal, "Have a good day and stay humble". 

## 2014-11-08 NOTE — Progress Notes (Signed)
Patient did not attend the evening karaoke group. Pt is not allowed to leave the unit and remained in his room during group time. Pt did get something to eat for a snack while the patients were off the unit.

## 2014-11-08 NOTE — Progress Notes (Signed)
Adult Psychoeducational Group Note  Date:  11/08/2014 Time:  0930  Group Topic/Focus:  Orientation:   The focus of this group is to educate the patient on the purpose and policies of crisis stabilization and provide a format to answer questions about their admission.  The group details unit policies and expectations of patients while admitted.  Participation Level:  Did Not Attend  Participation Quality:    Affect:    Cognitive:    Insight:   Engagement in Group:    Modes of Intervention:    Additional Comments:    Donnis Phaneuf L 11/08/2014, 1:33 PM
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
## 2014-11-08 NOTE — Progress Notes (Signed)
D   Pt has stayed in his room   He did come out during group time and request one of the extra trays saying he was hungry   He is presently laying in his bed and does not wake up when his name is called   He did not come for his bedtime medications    He seems disorganized in his thoughts but is able to make his needs known A    Verbal support given   Provided nourishment and redirection as needed   Q 15 min checks R    Pt safe at present

## 2014-11-08 NOTE — [facility] Group Notes (Signed)
[facility] LCSW Group Therapy  11/08/2014 4:12 PM   Type of Therapy:  Group Therapy  Participation Level:  Invited.  Did not attend  Summary of Progress/Problems: Today's group focused on relapse prevention.  We defined the term, and then brainstormed on ways to prevent relapse.  Daryel Geraldorth, Chandra Asher B 11/08/2014 , 4:12 PM

## 2014-11-09 MED ORDER — DOXEPIN HCL 10 MG PO CAPS: 10.0000 mg | ORAL_CAPSULE | Freq: Every day | ORAL | Status: DC

## 2014-11-09 MED ORDER — NICOTINE 21 MG/24HR TD PT24
21.0000 mg | MEDICATED_PATCH | Freq: Every day | TRANSDERMAL | Status: DC
  Administered 2014-11-09 – 2014-11-10 (×3): 21 mg via TRANSDERMAL
  Filled 2014-11-09 (×5): qty 1

## 2014-11-09 MED ORDER — DOXEPIN HCL 10 MG PO CAPS
10.0000 mg | ORAL_CAPSULE | Freq: Every day | ORAL | Status: DC
  Administered 2014-11-09: 10 mg via ORAL
  Filled 2014-11-09 (×3): qty 1

## 2014-11-09 NOTE — [facility] Suicide Risk Assessment (Signed)
[facility] INPATIENT:  Family/Significant Other Suicide Prevention Education  Suicide Prevention Education:  Education Completed; Mother Helaine Chessracy Holt [phone]  has been identified by the patient as the family member/significant other with whom the patient will be residing, and identified as the person(s) who will aid the patient in the event of a mental health crisis (suicidal ideations/suicide attempt).  With written consent from the patient, the family member/significant other has been provided the following suicide prevention education, prior to the and/or following the discharge of the patient.  The suicide prevention education provided includes the following:  Suicide risk factors  Suicide prevention and interventions  National Suicide Hotline telephone number  [facility] assessment telephone number  [facility] Emergency Assistance 911  [facility] and/or Residential Mobile Crisis Unit telephone number  Request made of family/significant other to:  Remove weapons (e.g., guns, rifles, knives), all items previously/currently identified as safety concern.    Remove drugs/medications (over-the-counter, prescriptions, illicit drugs), all items previously/currently identified as a safety concern.  The family member/significant other verbalizes understanding of the suicide prevention education information provided.  The family member/significant other agrees to remove the items of safety concern listed above.  Daryel Geraldorth, Wen Munford B 11/09/2014, 2:58 PM

## 2014-11-09 NOTE — Progress Notes (Signed)
Pt was observed awake in his room and was instructed to come to the medication window to get medications that he didn't take at bedtime   He came to the med window and while nurse was getting his medications ready he ran back to his room then immediately came out and back down the hall   He acted bizarre and afraid like something was after him  Pt did take his medication and returned to his room

## 2014-11-09 NOTE — Progress Notes (Signed)
Patient ID: Cody [name], male   DOB: August 06, 1990, 24 y.o.   MRN: [id number] First Nicoderm patch fell off and he threw it away, second patch put on now at 1845.

## 2014-11-09 NOTE — Progress Notes (Signed)
D: Patient was sleeping until approximately 2330 tonight.  Patient woke up and was drawing in his room.  Patient states he had a good day.  Patient guarded and minimal with Clinical research associatewriter.  Patient states he did not have a goal for today.  Patient states he ate well today.  Writer encouraged patient to eat a snack tonight but patient refused.   Patient denies SI/HI and denies AVH.   A: Staff to monitor Q 15 mins for safety.  Encouragement and support offered.  Scheduled medications administered per orders. R: Patient remains safe on the unit.  Patient did not attend group tonight.  Patient visible on the unit for medications.  Patient taking administered medications.

## 2014-11-09 NOTE — [facility] Group Notes (Signed)
[facility]   11/09/2014 11:47 AM  Participation Quality:  Invited  Did not attend    [place], Jady Braggs B

## 2014-11-09 NOTE — Progress Notes (Signed)
Patient ID: Cody [name], male   DOB: 23-Aug-1990, 23 y.o.   MRN: [id number] Approached writer and asked me to call his mom so she could come and pick him up. He has not been discharged, but does appear more relaxed and spontaneous. Explained to him he can call and speak with his mom but she can not take him home till Dr. Celedonio Savageischarges him, which is not today. Took information without becoming irritable. Told his demeanor will be noted, and seen as an improvement.

## 2014-11-09 NOTE — Progress Notes (Signed)
Patient ID: Cody [name], male   DOB: 1991/04/15, 24 y.o.   MRN: [id number] D-Isolative, asked twice to come get his am medications, but it took the tech to get him for him to come up. He verbalizes very little and only what he has to say to questions asked. He asked to open the pill packages himself, indicating some paranoia. He was allowed to do so in writers presence. A-Medications as ordered, monitored for safety, Support offered' R-Is able to ask for what he wants, in room and in bed all am. Did not attend am group.

## 2014-11-09 NOTE — Progress Notes (Signed)
Patient in bed tonight and has not been out in the milieu.  Patient states he does not need anything at this time
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
## 2014-11-09 NOTE — [facility] Group Notes (Signed)
[facility] LCSW Group Therapy  11/09/2014  1:05 PM  Type of Therapy:  Group therapy  Participation Level:  Active  Participation Quality:  In bed, asleep.  Did not stir Summary of Progress/Problems:  Chaplain was here to lead a group on themes of hope and courage.  Cody [name], Cody [name] 11/09/2014 1:12 PM

## 2014-11-10 LAB — LITHIUM LEVEL: Lithium Lvl: 0.21 mmol/L — ABNORMAL LOW (ref 0.60–1.20)

## 2014-11-10 MED ORDER — NICOTINE 21 MG/24HR TD PT24: 21.0000 mg | MEDICATED_PATCH | Freq: Every day | TRANSDERMAL | Status: DC

## 2014-11-10 MED ORDER — RISPERIDONE 0.5 MG PO TBDP
0.5000 mg | ORAL_TABLET | Freq: Every day | ORAL | Status: DC
  Filled 2014-11-10: qty 14

## 2014-11-10 MED ORDER — HYDROXYZINE HCL 50 MG PO TABS: 50.0000 mg | ORAL_TABLET | ORAL | Status: DC | PRN

## 2014-11-10 MED ORDER — RISPERIDONE 0.5 MG PO TBDP: 3.5000 mg | ORAL_TABLET | Freq: Every day | ORAL | Status: DC

## 2014-11-10 MED ORDER — RISPERIDONE 1 MG PO TBDP
3.0000 mg | ORAL_TABLET | Freq: Every day | ORAL | Status: DC
  Filled 2014-11-10: qty 42

## 2014-11-10 MED ORDER — RISPERIDONE 2 MG PO TBDP: 2.0000 mg | ORAL_TABLET | Freq: Every day | ORAL | Status: DC

## 2014-11-10 MED ORDER — GABAPENTIN 100 MG PO CAPS: 200.0000 mg | ORAL_CAPSULE | Freq: Three times a day (TID) | ORAL | Status: DC

## 2014-11-10 MED ORDER — LITHIUM CARBONATE 300 MG PO CAPS: 300.0000 mg | ORAL_CAPSULE | Freq: Two times a day (BID) | ORAL | Status: DC

## 2014-11-10 MED ORDER — RISPERIDONE 1 MG PO TBDP: 1.0000 mg | ORAL_TABLET | Freq: Every day | ORAL | Status: DC

## 2014-11-10 MED ORDER — BENZTROPINE MESYLATE 0.5 MG PO TABS: ORAL_TABLET | ORAL | Status: DC

## 2014-11-10 NOTE — Progress Notes (Signed)
MD completed pt's DC SRA as well as DC order. Pt states he's " ready to go " and he completed his daily assessment and on it he wrote he denied SI  And he rated his depression, hopelessness and anxiety " 2/5/1", respectively. All belongings previously placed in locker were returned to him and he was given sample meds along with  Prescriptions for continuing these medications. His AVS and directions on it ( for f/u care and medication teaching sheet ) are given to him after this data is explained ( and pt states verbal understanding for meds) as well as willingness to comply with f/u care. HE looks this Clinical research associatewriter in the eye and says " thnak you...ibuprofen'll see ya) and is dc'd home.

## 2014-11-10 NOTE — Discharge Summary (Signed)
Physician Discharge Summary Note  Patient:  Cody [name] is an 24 y.o., male MRN:  [id number] DOB:  1991/02/19 Patient phone:  [phone] (home)  Patient address:   [address] [place] [address],  Total Time spent with patient: 45 minutes  Date of Admission:  11/03/2014 Date of Discharge: 11/10/2014  Reason for Admission:  psychosis  Principal Problem: Schizoaffective disorder, bipolar type Discharge Diagnoses: Patient Active Problem List   Diagnosis Date Noted  . Schizoaffective disorder, bipolar type [F25.0] 11/05/2014    Musculoskeletal: Strength & Muscle Tone: within normal limits Gait & Station: normal Patient leans: N/A  Psychiatric Specialty Exam:  SEE SRA Physical Exam  Vitals reviewed.   Review of Systems  All other systems reviewed and are negative.   Blood pressure 123/72, pulse 102, temperature 98.6 F (37 C), temperature source Oral, resp. rate 17, height  (1.651 m), weight 63.957 kg (141 lb), SpO2 98 %.Body mass index is 23.46 kg/(m^2).  Have you used any form of tobacco in the last 30 days? (Cigarettes, Smokeless Tobacco, Cigars, and/or Pipes): No  Has this patient used any form of tobacco in the last 30 days? (Cigarettes, Smokeless Tobacco, Cigars, and/or Pipes) Yes, Prescription not provided because: Rx and samples given  Past Medical History:  Past Medical History  Diagnosis Date  . Mood disorder   . Schizophrenia    History reviewed. No pertinent past surgical history. Family History: History reviewed. No pertinent family history. Social History:  History  Alcohol Use  . Yes     History  Drug Use  . Yes  . Special: Marijuana    History   Social History  . Marital Status: Legally Separated    Spouse Name: N/A  . Number of Children: N/A  . Years of Education: N/A   Social History Main Topics  . Smoking status: Never Smoker   . Smokeless tobacco: Not on file  . Alcohol Use: Yes  . Drug Use: Yes    Special: Marijuana   . Sexual Activity: No   Other Topics Concern  . None   Social History Narrative   Risk to Self: Is patient at risk for suicide?: No What has been your use of drugs/alcohol within the last 12 months?: Patient denies Risk to Others:   Prior Inpatient Therapy:   Prior Outpatient Therapy:    Level of Care:  OP  Hospital Course:  Cody [name] is a 24 year old African-American male. Admitted from the [facility] ED with complaints of worsening psychotic symptoms after being off of medications x 2 weeks.  Cody [name] was admitted for Schizoaffective disorder, bipolar type and crisis management.  He was treated discharged with the medications listed below under Medication List.  Medical problems were identified and treated as needed.  Home medications were restarted as appropriate.  Improvement was monitored by observation and Cody [name] daily report of symptom reduction.  Emotional and mental status was monitored by daily self-inventory reports completed by Cody [name] and clinical staff.         Cody [name] was evaluated by the treatment team for stability and plans for continued recovery upon discharge.  Cody [name] motivation was an integral factor for scheduling further treatment.  Employment, transportation, bed availability, health status, family support, and any pending legal issues were also considered during his hospital stay.  He was offered further treatment options upon discharge including but not limited to Residential, Intensive Outpatient, and Outpatient  treatment.  Cody [name] will follow up with the services as listed below under Follow Up Information.     Upon completion of this admission the patient was both mentally and medically stable for discharge denying suicidal/homicidal ideation, auditory/visual/tactile hallucinations, delusional thoughts and paranoia.       Consults:  psychiatry  Significant Diagnostic Studies:  labs: per ED  Discharge  Vitals:   Blood pressure 123/72, pulse 102, temperature 98.6 F (37 C), temperature source Oral, resp. rate 17, height  (1.651 m), weight 63.957 kg (141 lb), SpO2 98 %. Body mass index is 23.46 kg/(m^2). Lab Results:   No results found for this or any previous visit (from the past 72 hour(s)).  Physical Findings: AIMS:  , ,  ,  ,    CIWA:    COWS:      See Psychiatric Specialty Exam and Suicide Risk Assessment completed by Attending Physician prior to discharge.  Discharge destination:  Home  Is patient on multiple antipsychotic therapies at discharge:  No   Has Patient had three or more failed trials of antipsychotic monotherapy by history:  No    Recommended Plan for Multiple Antipsychotic Therapies: NA     Medication List    STOP taking these medications        divalproex 250 MG DR tablet  Commonly known as:  DEPAKOTE     risperiDONE 0.5 MG tablet  Commonly known as:  RISPERDAL  Replaced by:  risperiDONE 1 MG disintegrating tablet      TAKE these medications      Indication   benztropine 0.5 MG tablet  Commonly known as:  COGENTIN  Take one tab (0.5 mg) in the morning and one in the evening.   Indication:  Extrapyramidal Reaction caused by Medications     gabapentin 100 MG capsule  Commonly known as:  NEURONTIN  Take 2 capsules (200 mg total) by mouth 3 (three) times daily.   Indication:  Agitation     hydrOXYzine 50 MG tablet  Commonly known as:  ATARAX/VISTARIL  Take 1 tablet (50 mg total) by mouth every 4 (four) hours as needed for anxiety (sleep).   Indication:  Anxiety Neurosis     lithium carbonate 300 MG capsule  Take 1 capsule (300 mg total) by mouth 2 (two) times daily with a meal.   Indication:  Depression     nicotine 21 mg/24hr patch  Commonly known as:  NICODERM CQ - dosed in mg/24 hours  Place 1 patch (21 mg total) onto the skin daily.   Indication:  Nicotine Addiction     risperiDONE 1 MG disintegrating tablet  Commonly known  as:  RISPERDAL M-TABS  Take 1 tablet (1 mg total) by mouth daily at 2 PM daily at 2 PM.   Indication:  mood control     risperiDONE 2 MG disintegrating tablet  Commonly known as:  RISPERDAL M-TABS  Take 1 tablet (2 mg total) by mouth daily.   Indication:  mood stabilization     risperiDONE 0.5 MG disintegrating tablet  Commonly known as:  RISPERDAL M-TABS  Take 7 tablets (3.5 mg total) by mouth at bedtime.   Indication:  Major Depressive Disorder           Follow-up Information    Schedule an appointment as soon as possible for a visit with [facility].   Why:  Your mother told us she would follow up with them and get you an appointment.  If unable to  do so, go to the walk-in clinic next week, any time Monday-Friday 8AM-4PM.  It is a full day, so take snacks.   Contact information:   [facility] [address]   [place]   Telephone:  [phone]      Follow-up recommendations:  Activity:  as tol, diet as tol  Comments:  1.  Take all your medications as prescribed.              2.  Report any adverse side effects to outpatient provider.                       3.  Patient instructed to not use alcohol or illegal drugs while on prescription medicines.            4.  In the event of worsening symptoms, instructed patient to call 911, the crisis hotline or go to nearest emergency room for evaluation of symptoms.  Total Discharge Time: 40 min  Signed: Velna HatchetSheila May Agustin AGNP-BC 11/10/2014, 1:04 PM  I personally assessed the patient and formulated the plan Madie RenoIrving A. Dub MikesLugo, M.D.

## 2014-11-10 NOTE — [facility] Group Notes (Signed)
[facility] Group Notes: (Clinical Social Work)   11/10/2014      Type of Therapy:  Group Therapy   Participation Level:  Did Not Attend despite MHT prompting   Ambrose MantleMareida Grossman-Orr, LCSW 11/10/2014, 12:40 PM

## 2014-11-10 NOTE — Progress Notes (Signed)
  [facility]  Will you be returning to the same living situation after discharge:  Yes,  home At discharge, do you have transportation home?: Yes,  mother Do you have the ability to pay for your medications: Yes,  no issues  Release of information consent forms completed and in the chart;  Patient's signature needed at discharge.  Patient to Follow up at: Follow-up Information    Schedule an appointment as soon as possible for a visit with [facility].   Why:  Your mother told us she would follow up with them and get you an appointment.  If unable to do so, go to the walk-in clinic next week, any time Monday-Friday 8AM-4PM.  It is a full day, so take snacks.   Contact information:   [facility] [address]   [place]   Telephone:  [phone]      Patient denies SI/HI: Yes,  see doctor's Discharge Suicide Risk Assessment    Safety Planning and Suicide Prevention discussed: Yes,  with mother and with patient  Have you used any form of tobacco in the last 30 days? (Cigarettes, Smokeless Tobacco, Cigars, and/or Pipes): No  Has patient been referred to the Quitline?: Yes, faxed on 11/10/14  Sarina SerGrossman-Orr, Kesha Hurrell Jo 11/10/2014, 12:53 PM

## 2014-11-10 NOTE — [facility] Suicide Risk Assessment (Signed)
[facility] Discharge Suicide Risk Assessment   Demographic Factors:  Male and Adolescent or young adult  Total Time spent with patient: 30 minutes  Musculoskeletal: Strength & Muscle Tone: within normal limits Gait & Station: normal Patient leans: N/A  Psychiatric Specialty Exam: Physical Exam  Review of Systems  Constitutional: Negative.   HENT: Negative.   Eyes: Negative.   Respiratory: Negative.   Cardiovascular: Negative.   Gastrointestinal: Negative.   Genitourinary: Negative.   Musculoskeletal: Negative.   Skin: Negative.   Neurological: Negative.   Endo/Heme/Allergies: Negative.   Psychiatric/Behavioral: The patient is nervous/anxious.     Blood pressure 123/72, pulse 102, temperature 98.6 F (37 C), temperature source Oral, resp. rate 17, height  (1.651 m), weight 63.957 kg (141 lb), SpO2 98 %.Body mass index is 23.46 kg/(m^2).  General Appearance: Fairly Groomed  Patent attorney::  Fair  Speech:  Clear and Coherent and Slow409  Volume:  Decreased  Mood:  Anxious  Affect:  Appropriate  Thought Process:  Coherent and Goal Directed  Orientation:  Other:  person place  Thought Content:  answers questions, shares his plans once D/C  Suicidal Thoughts:  No  Homicidal Thoughts:  No  Memory:  Immediate;   Fair Recent;   Fair Remote;   Fair  Judgement:  Fair  Insight:  Present and Shallow  Psychomotor Activity:  Normal  Concentration:  Fair  Recall:  Poor  Fund of Knowledge:Poor  Language: Fair  Akathisia:  No  Handed:  Right  AIMS (if indicated):     Assets:  Desire for Improvement Housing Social Support  Sleep:  Number of Hours: 4.5  Cognition: WNL  ADL's:  Intact   Have you used any form of tobacco in the last 30 days? (Cigarettes, Smokeless Tobacco, Cigars, and/or Pipes): No  Has this patient used any form of tobacco in the last 30 days? (Cigarettes, Smokeless Tobacco, Cigars, and/or Pipes) No  Mental Status Per Nursing Assessment::   On Admission:      Current Mental Status by Physician: in full contact with reality. Mcguire is not spontaneous but answers questions. He is easily engaged. He states that his frustration comes from when he cant understand what is being said to him. States he likes the medications he is taking now. Claims the reason for what he was admitted was his having taken someone else's pills. He denies SI plans or intent. Denies hearing voices. He states he is willing to take his medications. Will be staying with his father. Mother has called and today is the only day she could pick him up. According to staff he much better and his mother confirms it   Loss Factors: NA  Historical Factors: NA  Risk Reduction Factors:   Living with another person, especially a relative and Positive social support  Continued Clinical Symptoms: Schizoaffective Disorder   Cognitive Features That Contribute To Risk:  Closed-mindedness, Polarized thinking and Thought constriction (tunnel vision)    Suicide Risk:  Minimal: No identifiable suicidal ideation.  Patients presenting with no risk factors but with morbid ruminations; may be classified as minimal risk based on the severity of the depressive symptoms  Principal Problem: Schizoaffective disorder, bipolar type Discharge Diagnoses:  Patient Active Problem List   Diagnosis Date Noted  . Schizoaffective disorder, bipolar type [F25.0] 11/05/2014    Follow-up Information    Schedule an appointment as soon as possible for a visit with [facility].   Why:  Your mother told us she would follow up with  them and get you an appointment.  If unable to do so, go to the walk-in clinic next week, any time Monday-Friday 8AM-4PM.  It is a full day, so take snacks.   Contact information:   [facility] [address]   [place]   Telephone:  [phone]      Plan Of Care/Follow-up recommendations:  Activity:  as tolerated Diet:  regular Follow up outpatient basis as above Is patient on  multiple antipsychotic therapies at discharge:  No   Has Patient had three or more failed trials of antipsychotic monotherapy by history:  No  Recommended Plan for Multiple Antipsychotic Therapies: NA    Kristell Wooding A 11/10/2014, 1:34 PM

## 2014-11-30 ENCOUNTER — Emergency Department (HOSPITAL_COMMUNITY)
Admission: EM | Admit: 2014-11-30 | Discharge: 2014-11-30 | Discharge status: Against Medical Advice | Payer: Self-pay | Attending: Emergency Medicine | Admitting: Emergency Medicine

## 2014-11-30 ENCOUNTER — Encounter (HOSPITAL_COMMUNITY): Payer: Self-pay

## 2014-11-30 DIAGNOSIS — R1110 Vomiting, unspecified: Secondary | ICD-10-CM | POA: Insufficient documentation

## 2014-11-30 DIAGNOSIS — R52 Pain, unspecified: Secondary | ICD-10-CM | POA: Insufficient documentation

## 2014-11-30 LAB — COMPREHENSIVE METABOLIC PANEL
ALBUMIN: 4.7 g/dL (ref 3.5–5.0)
ALT: 16 U/L — ABNORMAL LOW (ref 17–63)
AST: 20 U/L (ref 15–41)
Alkaline Phosphatase: 48 U/L (ref 38–126)
Anion gap: 7 (ref 5–15)
BILIRUBIN TOTAL: 1 mg/dL (ref 0.3–1.2)
BUN: 11 mg/dL (ref 6–20)
CHLORIDE: 106 mmol/L (ref 101–111)
CO2: 25 mmol/L (ref 22–32)
Calcium: 9.5 mg/dL (ref 8.9–10.3)
Creatinine, Ser: 1.04 mg/dL (ref 0.61–1.24)
GFR calc Af Amer: >60 mL/min (ref >60)
Glucose, Bld: 96 mg/dL (ref 65–99)
POTASSIUM: 4.3 mmol/L (ref 3.5–5.1)
SODIUM: 138 mmol/L (ref 135–145)
Total Protein: 7.7 g/dL (ref 6.5–8.1)

## 2014-11-30 LAB — CBC WITH DIFFERENTIAL/PLATELET
Basophils Absolute: 0 10*3/uL (ref 0.0–0.1)
Basophils Relative: 0 % (ref 0–1)
Eosinophils Absolute: 0 10*3/uL (ref 0.0–0.7)
Eosinophils Relative: 0 % (ref 0–5)
HCT: 41.7 % (ref 39.0–52.0)
Hemoglobin: 13.6 g/dL (ref 13.0–17.0)
Lymphocytes Relative: 18 % (ref 12–46)
Lymphs Abs: 1.6 10*3/uL (ref 0.7–4.0)
MCH: 29.2 pg (ref 26.0–34.0)
MCHC: 32.6 g/dL (ref 30.0–36.0)
MCV: 89.5 fL (ref 78.0–100.0)
MONO ABS: 0.4 10*3/uL (ref 0.1–1.0)
Monocytes Relative: 5 % (ref 3–12)
Neutro Abs: 6.6 10*3/uL (ref 1.7–7.7)
Neutrophils Relative %: 77 % (ref 43–77)
Platelets: 238 10*3/uL (ref 150–400)
RBC: 4.66 MIL/uL (ref 4.22–5.81)
RDW: 13.1 % (ref 11.5–15.5)
WBC: 8.7 10*3/uL (ref 4.0–10.5)

## 2014-11-30 LAB — LIPASE, BLOOD: Lipase: 13 U/L — ABNORMAL LOW (ref 22–51)

## 2014-11-30 NOTE — ED Notes (Signed)
Pt did not answer when called

## 2014-11-30 NOTE — ED Notes (Signed)
Pt c/o n/v and generalized body aches x 1 day.  Pt's step-father reports that he was started on new psych medications last week.  Sts "the people across the street changed stuff.  They don't look like the same pills."  This writer was unable to determine what medications were changed.   Pt is a poor historian.

## 2014-11-30 NOTE — ED Notes (Signed)
Attempted to call Pt w/o an answer.  Pt nor family let staff know that they were leaving.

## 2014-11-30 NOTE — ED Notes (Addendum)
Pt has been called x 3 times w/o an answer.

## 2014-12-03 ENCOUNTER — Emergency Department (HOSPITAL_COMMUNITY): Payer: Self-pay

## 2014-12-03 ENCOUNTER — Emergency Department (HOSPITAL_COMMUNITY)
Admission: EM | Admit: 2014-12-03 | Discharge: 2014-12-10 | Disposition: A | Discharge status: Other Acute Inpatient Hospital | Payer: Self-pay | Attending: Emergency Medicine | Admitting: Emergency Medicine

## 2014-12-03 DIAGNOSIS — R401 Stupor: Secondary | ICD-10-CM

## 2014-12-03 DIAGNOSIS — Z9119 Patient's noncompliance with other medical treatment and regimen: Secondary | ICD-10-CM | POA: Insufficient documentation

## 2014-12-03 DIAGNOSIS — Z720 Tobacco use: Secondary | ICD-10-CM | POA: Insufficient documentation

## 2014-12-03 DIAGNOSIS — IMO0001 Reserved for inherently not codable concepts without codable children: Secondary | ICD-10-CM

## 2014-12-03 DIAGNOSIS — F29 Unspecified psychosis not due to a substance or known physiological condition: Secondary | ICD-10-CM | POA: Insufficient documentation

## 2014-12-03 DIAGNOSIS — F250 Schizoaffective disorder, bipolar type: Secondary | ICD-10-CM | POA: Diagnosis present

## 2014-12-03 LAB — URINALYSIS, ROUTINE W REFLEX MICROSCOPIC
Bilirubin Urine: NEGATIVE
Glucose, UA: NEGATIVE mg/dL
HGB URINE DIPSTICK: NEGATIVE
Ketones, ur: 15 mg/dL — AB
LEUKOCYTES UA: NEGATIVE
Nitrite: NEGATIVE
PROTEIN: NEGATIVE mg/dL
SPECIFIC GRAVITY, URINE: 1.034 — AB (ref 1.005–1.030)
UROBILINOGEN UA: 1 mg/dL (ref 0.0–1.0)
pH: 6 (ref 5.0–8.0)

## 2014-12-03 LAB — CBC WITH DIFFERENTIAL/PLATELET
BASOS PCT: 0 % (ref 0–1)
Basophils Absolute: 0 10*3/uL (ref 0.0–0.1)
EOS ABS: 0 10*3/uL (ref 0.0–0.7)
Eosinophils Relative: 0 % (ref 0–5)
HCT: 44.3 % (ref 39.0–52.0)
Hemoglobin: 15 g/dL (ref 13.0–17.0)
Lymphocytes Relative: 15 % (ref 12–46)
Lymphs Abs: 1.5 10*3/uL (ref 0.7–4.0)
MCH: 29.6 pg (ref 26.0–34.0)
MCHC: 33.9 g/dL (ref 30.0–36.0)
MCV: 87.4 fL (ref 78.0–100.0)
MONO ABS: 0.8 10*3/uL (ref 0.1–1.0)
Monocytes Relative: 8 % (ref 3–12)
NEUTROS ABS: 7.3 10*3/uL (ref 1.7–7.7)
NEUTROS PCT: 77 % (ref 43–77)
Platelets: 215 10*3/uL (ref 150–400)
RBC: 5.07 MIL/uL (ref 4.22–5.81)
RDW: 12.9 % (ref 11.5–15.5)
WBC: 9.6 10*3/uL (ref 4.0–10.5)

## 2014-12-03 LAB — BASIC METABOLIC PANEL
Anion gap: 11 (ref 5–15)
BUN: 12 mg/dL (ref 6–20)
CHLORIDE: 106 mmol/L (ref 101–111)
CO2: 22 mmol/L (ref 22–32)
Calcium: 10 mg/dL (ref 8.9–10.3)
Creatinine, Ser: 1 mg/dL (ref 0.61–1.24)
Glucose, Bld: 105 mg/dL — ABNORMAL HIGH (ref 65–99)
Potassium: 4.1 mmol/L (ref 3.5–5.1)
Sodium: 139 mmol/L (ref 135–145)

## 2014-12-03 LAB — RAPID URINE DRUG SCREEN, HOSP PERFORMED
Amphetamines: NOT DETECTED
BARBITURATES: NOT DETECTED
BENZODIAZEPINES: NOT DETECTED
COCAINE: NOT DETECTED
OPIATES: NOT DETECTED
TETRAHYDROCANNABINOL: POSITIVE — AB

## 2014-12-03 LAB — ETHANOL: Alcohol, Ethyl (B): <5 mg/dL (ref <5)

## 2014-12-03 MED ORDER — ACETAMINOPHEN 325 MG PO TABS
650.0000 mg | ORAL_TABLET | Freq: Once | ORAL | Status: AC
  Administered 2014-12-06: 650 mg via ORAL
  Filled 2014-12-03 (×2): qty 2

## 2014-12-03 NOTE — ED Notes (Signed)
Attempted to give patient tylenol, patient would not take medicine. Pt standing beside recliner, will not communicate and will not sit down. Pt had fist clenched while this RN and EDT were in the room, no aggressive behavior noticed. Pt staring off.

## 2014-12-03 NOTE — Progress Notes (Addendum)
Patient has been referred for IP treatment at: [facility] - per intake, fax referral. [facility] - per intake, fax referral.  [facility] - Per Aimie, fax referral, ([place] in [place], no insurance [place], per [employer], OV covers [employer]). [facility] - per Darleen, fax referral. [facility] - per intake, fax for waitlist. [facility] - "we have everything except geriatric male beds", fax referral, per Aimie. [facility] - per Elijah Birk, fax referral.  At capacity: [facility] - (adolescent beds only)  CSW will continue to seek placement.  Melbourne Abts, LCSWA Disposition staff 12/03/2014 5:06 PM

## 2014-12-03 NOTE — ED Notes (Signed)
Pt here via ems pt was found sitting in grass bystander called ems , pt eyes  Fluttering pt not talking ems states that pt responds to pain and follows some commands on arrival

## 2014-12-03 NOTE — BH Assessment (Signed)
Per Donell Sievert, PA - patient meets criteria for inpatient hospitalization.  CSW, Estevan Ryder will seek placement. Writer informed Dr.Yow and the patients RN Arlys John) of his disposition.

## 2014-12-03 NOTE — ED Notes (Signed)
Pt confirmed his name and birthdate but only by shaking head; pt sitting up in bed at present still not speaking words

## 2014-12-03 NOTE — Progress Notes (Signed)
Patient meets inpatient criteria, per PA-C Donell Sievert.  Melbourne Abts, LCSWA Disposition staff 12/03/2014 7:32 PM

## 2014-12-03 NOTE — BH Assessment (Addendum)
Assessment Note  Patient is a 24 year old African American male that was brought to the ED because he was sitting in grass with his eyes fluttering and refusing to speak.  Therefore, bystander called EMS.   Per documentation from EMS in the epic chart the patient responds to pain and follows some commands on arrival to the ED. Documentation in epic reports that the patient has had multiple encounters for psychosis.   Throughout the assessment the patient was fluttering his eyes.  Patient refused to answer any questions.  There were two nurses in his room to arouse and prompt him to answer the questions and the patient continues to refuse to speak.  The nurse working with the patient reports that he is medically cleared and he is able to speak he just refuses to speak.   Patient UDS is positive for cannabis and his BAL is <5.  Writer was not able to obtain any collateral information because the patient refuses to give the name and numbers of any family contacts.    Axis I: Psychotic Disorder, NOS  Axis II: Deferred Axis III: No past medical history on file. Axis IV: other psychosocial or environmental problems Axis V: 21-30 behavior considerably influenced by delusions or hallucinations OR serious impairment in judgment, communication OR inability to function in almost all areas  Past Medical History: No past medical history on file.  No past surgical history on file.  Family History: No family history on file.  Social History:  has no tobacco, alcohol, and drug history on file.  Additional Social History:  Alcohol / Drug Use History of alcohol / drug use?:  (Unable to assess.  Patient refused to speak.)  CIWA: CIWA-Ar BP: 128/77 mmHg Pulse Rate: 66 COWS:    Allergies: Allergies not on file  Home Medications:  (Not in a hospital admission)  OB/GYN Status:  No LMP for male patient.  General Assessment Data Location of Assessment: [facility] ED TTS Assessment: In system Is this a Tele  or Face-to-Face Assessment?: Tele Assessment Is this an Initial Assessment or a Re-assessment for this encounter?: Initial Assessment Marital status:  (Unable to assess.  Patient refused to speak. ) Cody [name] name: Cody [name] Is patient pregnant?: Other (Comment) (Cody [name]) Pregnancy Status:  (Cody [name]) Living Arrangements:  (Unable to assess.  Patient refused to speak. ) Can pt return to current living arrangement?:  (Unable to assess.  Patient refused to speak) Admission Status: Voluntary Is patient capable of signing voluntary admission?:  (Unable to assess.  Patient refused to speak) Referral Source:  (Patient brought in by EMS) Insurance type: Self Pay.  Info found in epic  Medical Screening Exam [facility] Walk-in ONLY) Medical Exam completed: Yes  Crisis Care Plan Living Arrangements:  (Unable to assess.  Patient refused to speak. ) Name of Psychiatrist:  (Unable to assess.  Patient refused to speak) Name of Therapist:  (Unable to assess.  Patient refused to speak)  Education Status Is patient currently in school?:  (Unable to assess.  Patient refused to speak) Current Grade: Cody [name] Highest grade of school patient has completed: Cody [name] Name of school: Cody [name] Contact person: Cody [name]  Risk to self with the past 6 months Suicidal Ideation:  (Unable to assess.  Patient refused to speak) Has patient been a risk to self within the past 6 months prior to admission? :  (Unable to assess.  Patient refused to speak) Suicidal Intent:  (Unable to assess.  Patient refused to speak) Has patient had any suicidal intent  within the past 6 months prior to admission? :  (Unable to assess.  Patient refused to speak) Is patient at risk for suicide?:  (Unable to assess.  Patient refused to speak) Suicidal Plan?:  (Unable to assess.  Patient refused to speak) Has patient had any suicidal plan within the past 6 months prior to admission? :  (Unable to assess.  Patient refused to speak) Access to Means:  (Unable to assess.  Patient refused to  speak) What has been your use of drugs/alcohol within the last 12 months?:  (Unable to assess.  Patient refused to speak) Previous Attempts/Gestures:  (Unable to assess.  Patient refused to speak) How many times?:  (Unable to assess.  Patient refused to speak) Other Self Harm Risks:  (Unable to assess.  Patient refused to speak) Triggers for Past Attempts:  (Unable to assess.  Patient refused to speak) Intentional Self Injurious Behavior:  (Unable to assess.  Patient refused to speak) Family Suicide History:  (Unable to assess.  Patient refused to speak) Recent stressful life event(s):  (Unable to assess.  Patient refused to speak) Persecutory voices/beliefs?:  (Unable to assess.  Patient refused to speak) Depression:  (Unable to assess.  Patient refused to speak) Depression Symptoms:  (Unable to assess.  Patient refused to speak) Substance abuse history and/or treatment for substance abuse?:  (Unable to assess.  Patient refused to speak) Suicide prevention information given to non-admitted patients:  (Unable to assess.  Patient refused to speak)  Risk to Others within the past 6 months Homicidal Ideation:  (Unable to assess.  Patient refused to speak) Does patient have any lifetime risk of violence toward others beyond the six months prior to admission? :  (Unable to assess.  Patient refused to speak) Thoughts of Harm to Others:  (Unable to assess.  Patient refused to speak) Current Homicidal Intent:  (Unable to assess.  Patient refused to speak) Current Homicidal Plan:  (Unable to assess.  Patient refused to speak) Access to Homicidal Means:  (Unable to assess.  Patient refused to speak) Identified Victim: Unable to assess.  Patient refused to speak History of harm to others?:  (Unable to assess.  Patient refused to speak) Assessment of Violence:  (Unable to assess.  Patient refused to speak) Violent Behavior Description: Unable to assess.  Patient refused to speak Does patient have  access to weapons?:  (Unable to assess.  Patient refused to speak) Criminal Charges Pending?:  (Unable to assess.  Patient refused to speak) Does patient have a court date:  (Unable to assess.  Patient refused to speak) Is patient on probation?:  (Unable to assess.  Patient refused to speak)  Psychosis Hallucinations:  (Unable to assess.  Patient refused to speak) Delusions:  (Unable to assess.  Patient refused to speak)  Mental Status Report Appearance/Hygiene:  (Unable to assess.  Patient refused to speak) Eye Contact: Poor (Patient refused to open his eyes.) Motor Activity: Unable to assess (Unable to assess.  Patient refused to speak) Speech: Unable to assess (Unable to assess.  Patient refused to speak) Level of Consciousness: Unable to assess (Unable to assess.  Patient refused to speak) Mood:  (Unable to assess.  Patient refused to speak) Affect: Unable to Assess (Unable to assess.  Patient refused to speak) Anxiety Level:  (Unable to assess.  Patient refused to speak) Thought Processes: Unable to Assess (Unable to assess.  Patient refused to speak) Judgement: Unable to Assess (Unable to assess.  Patient refused to speak) Orientation: Unable to  assess (Unable to assess.  Patient refused to speak) Obsessive Compulsive Thoughts/Behaviors: Unable to Assess (Unable to assess.  Patient refused to speak)  Cognitive Functioning Concentration: Unable to Assess (Unable to assess.  Patient refused to speak) Memory: Unable to Assess (Unable to assess.  Patient refused to speak) IQ:  (Unable to assess.  Patient refused to speak) Insight: Unable to Assess (Unable to assess.  Patient refused to speak) Impulse Control: Unable to Assess (Unable to assess.  Patient refused to speak) Appetite:  (Unable to assess.  Patient refused to speak) Weight Loss:  (Unable to assess.  Patient refused to speak) Weight Gain:  (Unable to assess.  Patient refused to speak) Sleep: Unable to Assess (Unable to  assess.  Patient refused to speak) Total Hours of Sleep:  (Unable to assess.  Patient refused to speak) Vegetative Symptoms: Unable to Assess (Unable to assess.  Patient refused to speak)  ADLScreening [facility] Assessment Services) Patient's cognitive ability adequate to safely complete daily activities?:  (Unable to assess.  Patient refused to speak.) Patient able to express need for assistance with ADLs?:  (Unable to assess.  Patient refused to speak.)  Prior Inpatient Therapy Prior Inpatient Therapy:  (Unable to assess.  Patient refused to speak)  Prior Outpatient Therapy Prior Outpatient Therapy:  (Unable to assess.  Patient refused to speak) Does patient have an ACCT team?:  (Unable to assess.  Patient refused to speak) Does patient have Intensive In-House Services?  :  (Unable to assess.  Patient refused to speak) Does patient have [facility] services? :  (Unable to assess.  Patient refused to speak) Does patient have [facility] services?:  (Unable to assess.  Patient refused to speak)  ADL Screening (condition at time of admission) Patient's cognitive ability adequate to safely complete daily activities?:  (Unable to assess.  Patient refused to speak.) Is the patient deaf or have difficulty hearing?:  (Unable to assess.  Patient refused to speak.) Does the patient have difficulty seeing, even when wearing glasses/contacts?:  (Unable to assess.  Patient refused to speak.) Does the patient have difficulty concentrating, remembering, or making decisions?:  (Unable to assess.  Patient refused to speak.) Patient able to express need for assistance with ADLs?:  (Unable to assess.  Patient refused to speak.) Does the patient have difficulty dressing or bathing?:  (Unable to assess.  Patient refused to speak.) Does the patient have difficulty walking or climbing stairs?:  (Unable to assess.  Patient refused to speak.) Weakness of Legs:  (Unable to assess.  Patient refused to speak.) Weakness of  Arms/Hands:  (Unable to assess.  Patient refused to speak.)  Home Assistive Devices/Equipment Home Assistive Devices/Equipment:  (Unable to assess.  Patient refused to speak.)    Abuse/Neglect Assessment (Assessment to be complete while patient is alone) Physical Abuse:  (Unable to assess.  Patient refused to speak.) Verbal Abuse:  (Unable to assess.  Patient refused to speak.) Sexual Abuse:  (Unable to assess.  Patient refused to speak.) Exploitation of patient/patient's resources:  (Unable to assess.  Patient refused to speak.) Self-Neglect:  (Unable to assess.  Patient refused to speak.) Possible abuse reported to::  (Unable to assess.  Patient refused to speak.) Values / Beliefs Cultural Requests During Hospitalization:  (Unable to assess.  Patient refused to speak.) Spiritual Requests During Hospitalization:  (Unable to assess.  Patient refused to speak.) Consults Spiritual Care Consult Needed:  (Unable to assess.  Patient refused to speak.) Social Work Consult Needed:  (Unable to assess.  Patient  refused to speak.) Advance Directives (For Healthcare) Does patient have an advance directive?: No Would patient like information on creating an advanced directive?:  (Unable to assess.  Patient refused to speak.)    Additional Information 1:1 In Past 12 Months?:  (Unable to assess.  Patient refused to speak) CIRT Risk:  (Unable to assess.  Patient refused to speak) Elopement Risk:  (Unable to assess.  Patient refused to speak) Does patient have medical clearance?: Yes     Disposition: Pending psych disposition. Disposition Initial Assessment Completed for this Encounter: Yes Disposition of Patient: Other dispositions (Pending psych disposition. Marland Kitchen ) Other disposition(s):  (Pending psych disposition)  On Site Evaluation by:   Reviewed with Physician:    Cody [name] 12/03/2014 5:39 PM

## 2014-12-03 NOTE — ED Notes (Signed)
Patient transported to CT 

## 2014-12-03 NOTE — ED Provider Notes (Signed)
CSN: [id number]     Arrival date & time 12/03/14  1202 History   First MD Initiated Contact with Patient 12/03/14 1214     Chief Complaint  Patient presents with  . Altered Mental Status     (Consider location/radiation/quality/duration/timing/severity/associated sxs/prior Treatment) Patient is a 24 y.o. male presenting with altered mental status.  Altered Mental Status Presenting symptoms: partial responsiveness   Severity:  Moderate Most recent episode:  Today Episode history:  Unable to specify Duration: unknown. Timing:  Constant Progression:  Partially resolved Chronicity: unknwon. Context comment:  Unknown Associated symptoms comment:  Cannot obtain ROS as pt is not compliant   No past medical history on file. No past surgical history on file. No family history on file. History  Substance Use Topics  . Smoking status: Not on file  . Smokeless tobacco: Not on file  . Alcohol Use: Not on file    Review of Systems  Unable to perform ROS     Allergies  Review of patient's allergies indicates not on file.  Home Medications   Prior to Admission medications   Not on File   BP 128/77 mmHg  Pulse 66  Temp(Src) 98.2 F (36.8 C) (Axillary)  Resp 22  SpO2 100% Physical Exam  Constitutional: He appears well-nourished. No distress.  HENT:  Head: Normocephalic and atraumatic.  Right Ear: External ear normal.  Left Ear: External ear normal.  Eyes: Pupils are equal, round, and reactive to light. Right eye exhibits no discharge. Left eye exhibits no discharge. No scleral icterus.  Patient fluttering his eyes.   Neck: Normal range of motion. Neck supple.  Cardiovascular: Normal rate.  Exam reveals no gallop and no friction rub.   No murmur heard. Pulmonary/Chest: Effort normal and breath sounds normal. No stridor. No respiratory distress. He has no wheezes. He has no rales. He exhibits no tenderness.  Abdominal: Soft. He exhibits no distension and no mass. There  is no tenderness. There is no rebound and no guarding.  Musculoskeletal: He exhibits no edema or tenderness.  Neurological: He is alert.  Patient not cooperative with exam. Patient fluttering his eyes throughout the interview and exam. Patient is responsive to pain stimuli and assists when changing his clothes. He is moving all extremities and no focal deficits noted on exam  Skin: Skin is warm and dry. No rash noted. He is not diaphoretic. No erythema.  Psychiatric:  Patient is nonverbal and noncommunicative    ED Course  Procedures (including critical care time) Labs Review Labs Reviewed  URINALYSIS, ROUTINE W REFLEX MICROSCOPIC (NOT AT [facility]) - Abnormal; Notable for the following:    Color, Urine AMBER (*)    Specific Gravity, Urine 1.034 (*)    Ketones, ur 15 (*)    All other components within normal limits  URINE RAPID DRUG SCREEN, HOSP PERFORMED - Abnormal; Notable for the following:    Tetrahydrocannabinol POSITIVE (*)    All other components within normal limits  BASIC METABOLIC PANEL - Abnormal; Notable for the following:    Glucose, Bld 105 (*)    All other components within normal limits  CBC WITH DIFFERENTIAL/PLATELET  ETHANOL    Imaging Review Ct Head Wo Contrast  12/03/2014   CLINICAL DATA:  Altered mental status  EXAM: CT HEAD WITHOUT CONTRAST  TECHNIQUE: Contiguous axial images were obtained from the base of the skull through the vertex without intravenous contrast.  COMPARISON:  None.  FINDINGS: The bony calvarium is intact. The ventricles are of normal  size and configuration. No findings to suggest acute hemorrhage, acute infarction or space-occupying mass lesion are noted.  IMPRESSION: No acute intracranial abnormality noted.   Electronically Signed   By: Alcide Clever M.D.   On: 12/03/2014 13:49     EKG Interpretation None      MDM   Unknown male found by bystanders on the side of the road who is awake but not responding. EMS contacted and brought the patient  in for evaluation. Patient remained hemodynamically stable in route however would not answer questions. On arrival he was symptomatically stable with ABCs intact. Rest of the history and exam as above. Patient continues to be not cooperative and noncommunicative. CT head unremarkable. UDS positive for THC and otherwise negative. Alcohol negative. Other labs unremarkable. We were able to get the patient to give [place] his first and last name and he was identified in the system as Jeni Salles. Going through records of the patient name Masao Fallah with picture of what looks to be the patient's renal multiple encounters for psychosis. However we are not able to completely verify or confirm his identity. The patient is currently medically cleared and requires psychiatric evaluation, which is currently pending.  She is seen in conjunction with Dr. Fredderick Phenix.  Patient care transferred to Dr. Clydene Pugh. Please see his note for further ED course.  Final diagnoses:  Stupor         Drema Pry, MD 12/03/14 1723  Rolan Bucco, MD 12/04/14 1210

## 2014-12-03 NOTE — ED Notes (Signed)
Pt sitting up with eye contact; speaking some with registration but not to this RN

## 2014-12-03 NOTE — BH Assessment (Signed)
TTS Cody [name] will assess the patient.  

## 2014-12-03 NOTE — ED Notes (Signed)
Patient reports he has a headache when asked if he has any pain. Pt unable to elaborate on type of pain or rate his pain. Patient also reports he is not allergic to any medications when asked.

## 2014-12-03 NOTE — ED Notes (Signed)
Patient changed into blue paper scrubs. 

## 2014-12-04 DIAGNOSIS — F250 Schizoaffective disorder, bipolar type: Secondary | ICD-10-CM | POA: Diagnosis present

## 2014-12-04 LAB — LITHIUM LEVEL: Lithium Lvl: 0.11 mmol/L — ABNORMAL LOW (ref 0.60–1.20)

## 2014-12-04 LAB — CBG MONITORING, ED: GLUCOSE-CAPILLARY: 113 mg/dL — AB (ref 65–99)

## 2014-12-04 NOTE — ED Notes (Signed)
Lab at bedside

## 2014-12-04 NOTE — ED Notes (Signed)
Pt continuously tries to crawl out of room door; attempts to through self down on floor observed by RN; pt being monitored by Teresa Coombs

## 2014-12-04 NOTE — ED Notes (Signed)
Pt ambulated to bathroom with 2 person assist.

## 2014-12-04 NOTE — Progress Notes (Signed)
Patient referral has been followed up at: [facility] - no answer [facility] - per Andie, assessment is busy, low acuity beds open. [facility] - no asnwer [facility] - per Tameka, referral received, to be reviewed. [facility] - per [employer], fax referral we have adult and some adolescent beds. Referral faxed.  On waitlist at: [facility] - per Angie  Declined at: [facility] - per Festus Barren, due to acuity  At capacity: [facility] - per Tenny Craw - per intake  CSW will continue to seek placement.  Melbourne Abts, LCSWA Disposition staff 12/04/2014 6:08 PM

## 2014-12-04 NOTE — ED Notes (Signed)
Pts CBG is 113.Nurse notified.

## 2014-12-04 NOTE — ED Notes (Addendum)
Pt sitting on floor "sweating profusely". Informed RN. Also, pt asked several times to open mouth for an oral temp, but just continued to sit there. For the most part, pt was cooperative (except for oral temp) and calm.

## 2014-12-04 NOTE — ED Provider Notes (Addendum)
19 10 PM I was called to the bedside as the patient reportedly had fallen or "laid himself down on the floor". I found him on the floor. He does not follow commands. Eyelids fluttering. Moves all extremities spontaneously. HEENT exam normocephalic atraumatic neck nontender no step-off lungs clear breath sounds heart regular rate and rhythm neurologic eyelids fluttering moves all extremities nonverbal no facial asymmetry. I have remote concern about atypical seizure activity, though doubtful. Patient presented to the emergency department with similar presentation is now. He's been evaluated by psychiatry. I spoke with Dr. Roseanne Reno who suggested EEG. Ordered by me.. Dr. Roseanne Reno suggested no further medication presently. There is no sign of injury.  Doug Sou, MD 12/04/14 2004 10 PM patient sitting up in bed, no longer with eye fluttering  Doug Sou, MD 12/04/14 2341

## 2014-12-04 NOTE — ED Notes (Signed)
Pt resting quietly at the time. No signs of distress noted.  

## 2014-12-04 NOTE — ED Notes (Signed)
Pt has been standing in room. Pt now sitting on floor, then laying on floor. Pt eyes fluttering and not verbally communicating/answering questions. Explained to pt that he cannot lay on floor but may sit/lay in bed or continue to stand. Pt not aggressive or violent, but continues to be nonverbal. Assisted pt to standing position and encouraged to rest in bed. Pt now sitting on edge of bed. No apparent distress. Will continue to ensure pt safety.

## 2014-12-04 NOTE — ED Notes (Signed)
Pt found lying on floor with eyes fluttering by RN after hearing a loud bang; Pt has freedom of movement; vitals signs are stable; Md notified; pt placed on bed and remains in a catatonic state;

## 2014-12-04 NOTE — Progress Notes (Signed)
CSW seeking inpatient Psych bed(s) for patient.   Referred to: [facility]- per Claris Che, possible beds opening later today [facility]-per Broadus John, beds available- refaxed 12/04/14 [facility]- per Misty Stanley, full but will review for waiting list [facility]- per August Saucer, reviewing for low acuity [facility]- to review [facility]- per [facility]  At Capacity:   [facility], MSW, [place]  [phone]

## 2014-12-04 NOTE — ED Notes (Signed)
TTS ongoing at the time. 

## 2014-12-04 NOTE — Consult Note (Signed)
Telepsych Consultation   Reason for Consult:  Psychosis Referring Physician:  EDP Patient Identification: Cody [name] MRN:  [id number] Principal Diagnosis: Schizoaffective disorder, bipolar type Diagnosis:   Patient Active Problem List   Diagnosis Date Noted  . Schizoaffective disorder, bipolar type [F25.0]     Priority: High    Total Time spent with patient: 25 minutes   Subjective:   Cody [name] is a 24 y.o. male patient admitted with reports that he was sitting in a field blinking his eyes constantly. Pt seen and chart reviewed. Pt did not speak to this NP and could not answer questions. Pt was blinking hard and constantly. He grunted twice and stood directly in front of the camera. Pt has been compliant with staff and does not appear to be upset, but does appear to be very confused.  This NP found another medical record (same name, same DOB, same address) matching this pt with his picture (matching). Pt was seen on 11/03/14 by Dr. Harrington Challenger for attempting to cut the head off the family dog and he was subsequently was admitted to [facility] under Dr. Shea Evans where he was stabilized and discharged at the end of May this year.   Collateral obtained from pt's mother is that pt went to [facility] and medications were changed, including the abrupt cessation of Risperdal without any anti-psychotic to replace it. Pt's mother states she was out of town and sent pt to stay with other relatives and that he declined during this time over the past few days, leaving the house often without shoes or proper clothing. Pt's mother is very concerned, reporting that pt "was stable a few weeks ago when he left [facility]". Pt's mother confirmed Lithium, Hydroxyzine, Gabapentin, and Cogentin, but Risperdal is missing.   *EDP ordered a Lithium level; results pending.  HPI:  Patient is a 24 year old African American male that was brought to the ED because he was sitting in grass with his eyes fluttering and refusing  to speak. Therefore, bystander called EMS. Per documentation from EMS in the epic chart the patient responds to pain and follows some commands on arrival to the ED. Documentation in epic reports that the patient has had multiple encounters for psychosis.   Throughout the assessment the patient was fluttering his eyes. Patient refused to answer any questions. There were two nurses in his room to arouse and prompt him to answer the questions and the patient continues to refuse to speak. The nurse working with the patient reports that he is medically cleared and he is able to speak he just refuses to speak. Patient UDS is positive for cannabis and his BAL is <5. Writer was not able to obtain any collateral information because the patient refuses to give the name and numbers of any family contacts.   Past Medical History: No past medical history on file. No past surgical history on file. Family History: No family history on file. Social History:  History  Alcohol Use: Not on file     History  Drug Use Not on file    History   Social History  . Marital Status: Single    Spouse Name: N/A  . Number of Children: N/A  . Years of Education: N/A   Social History Main Topics  . Smoking status: Not on file  . Smokeless tobacco: Not on file  . Alcohol Use: Not on file  . Drug Use: Not on file  . Sexual Activity: Not on file   Other  Topics Concern  . Not on file   Social History Narrative  . No narrative on file   Additional Social History:    History of alcohol / drug use?:  (Unable to assess.  Patient refused to speak.)                     Allergies:  No Known Allergies  Labs:  Results for orders placed or performed during the hospital encounter of 12/03/14 (from the past 48 hour(s))  Urinalysis, Routine w reflex microscopic (not at [facility])     Status: Abnormal   Collection Time: 12/03/14 12:22 PM  Result Value Ref Range   Color, Urine AMBER (A) YELLOW    Comment:  BIOCHEMICALS MAY BE AFFECTED BY COLOR   APPearance CLEAR CLEAR   Specific Gravity, Urine 1.034 (H) 1.005 - 1.030   pH 6.0 5.0 - 8.0   Glucose, UA NEGATIVE NEGATIVE mg/dL   Hgb urine dipstick NEGATIVE NEGATIVE   Bilirubin Urine NEGATIVE NEGATIVE   Ketones, ur 15 (A) NEGATIVE mg/dL   Protein, ur NEGATIVE NEGATIVE mg/dL   Urobilinogen, UA 1.0 0.0 - 1.0 mg/dL   Nitrite NEGATIVE NEGATIVE   Leukocytes, UA NEGATIVE NEGATIVE    Comment: MICROSCOPIC NOT DONE ON URINES WITH NEGATIVE PROTEIN, BLOOD, LEUKOCYTES, NITRITE, OR GLUCOSE <1000 mg/dL.  Urine rapid drug screen (hosp performed)     Status: Abnormal   Collection Time: 12/03/14 12:22 PM  Result Value Ref Range   Opiates NONE DETECTED NONE DETECTED   Cocaine NONE DETECTED NONE DETECTED   Benzodiazepines NONE DETECTED NONE DETECTED   Amphetamines NONE DETECTED NONE DETECTED   Tetrahydrocannabinol POSITIVE (A) NONE DETECTED   Barbiturates NONE DETECTED NONE DETECTED    Comment:        DRUG SCREEN FOR MEDICAL PURPOSES ONLY.  IF CONFIRMATION IS NEEDED FOR ANY PURPOSE, NOTIFY LAB WITHIN 5 DAYS.        LOWEST DETECTABLE LIMITS FOR URINE DRUG SCREEN Drug Class       Cutoff (ng/mL) Amphetamine      1000 Barbiturate      200 Benzodiazepine   886 Tricyclics       773 Opiates          300 Cocaine          300 THC              50   CBC with Differential     Status: None   Collection Time: 12/03/14 12:50 PM  Result Value Ref Range   WBC 9.6 4.0 - 10.5 K/uL   RBC 5.07 4.22 - 5.81 MIL/uL   Hemoglobin 15.0 13.0 - 17.0 g/dL   HCT 44.3 39.0 - 52.0 %   MCV 87.4 78.0 - 100.0 fL   MCH 29.6 26.0 - 34.0 pg   MCHC 33.9 30.0 - 36.0 g/dL   RDW 12.9 11.5 - 15.5 %   Platelets 215 150 - 400 K/uL   Neutrophils Relative % 77 43 - 77 %   Neutro Abs 7.3 1.7 - 7.7 K/uL   Lymphocytes Relative 15 12 - 46 %   Lymphs Abs 1.5 0.7 - 4.0 K/uL   Monocytes Relative 8 3 - 12 %   Monocytes Absolute 0.8 0.1 - 1.0 K/uL   Eosinophils Relative 0 0 - 5 %    Eosinophils Absolute 0.0 0.0 - 0.7 K/uL   Basophils Relative 0 0 - 1 %   Basophils Absolute 0.0 0.0 - 0.1 K/uL  Basic  metabolic panel     Status: Abnormal   Collection Time: 12/03/14 12:50 PM  Result Value Ref Range   Sodium 139 135 - 145 mmol/L   Potassium 4.1 3.5 - 5.1 mmol/L   Chloride 106 101 - 111 mmol/L   CO2 22 22 - 32 mmol/L   Glucose, Bld 105 (H) 65 - 99 mg/dL   BUN 12 6 - 20 mg/dL   Creatinine, Ser 1.00 0.61 - 1.24 mg/dL   Calcium 10.0 8.9 - 10.3 mg/dL   GFR calc non Af Amer NOT CALCULATED >60 mL/min   GFR calc Af Amer NOT CALCULATED >60 mL/min    Comment: (NOTE) The eGFR has been calculated using the CKD EPI equation. This calculation has not been validated in all clinical situations. eGFR's persistently <60 mL/min signify possible Chronic Kidney Disease.    Anion gap 11 5 - 15  Ethanol     Status: None   Collection Time: 12/03/14 12:51 PM  Result Value Ref Range   Alcohol, Ethyl (B) <5 <5 mg/dL    Comment:        LOWEST DETECTABLE LIMIT FOR SERUM ALCOHOL IS 5 mg/dL FOR MEDICAL PURPOSES ONLY     Vitals: Blood pressure 127/104, pulse 109, temperature 99 F (37.2 C), temperature source Oral, resp. rate 18, SpO2 100 %.  Risk to Self: Suicidal Ideation:  (Unable to assess.  Patient refused to speak) Suicidal Intent:  (Unable to assess.  Patient refused to speak) Is patient at risk for suicide?:  (Unable to assess.  Patient refused to speak) Suicidal Plan?:  (Unable to assess.  Patient refused to speak) Access to Means:  (Unable to assess.  Patient refused to speak) What has been your use of drugs/alcohol within the last 12 months?:  (Unable to assess.  Patient refused to speak) How many times?:  (Unable to assess.  Patient refused to speak) Other Self Harm Risks:  (Unable to assess.  Patient refused to speak) Triggers for Past Attempts:  (Unable to assess.  Patient refused to speak) Intentional Self Injurious Behavior:  (Unable to assess.  Patient refused to  speak) Risk to Others: Homicidal Ideation:  (Unable to assess.  Patient refused to speak) Thoughts of Harm to Others:  (Unable to assess.  Patient refused to speak) Current Homicidal Intent:  (Unable to assess.  Patient refused to speak) Current Homicidal Plan:  (Unable to assess.  Patient refused to speak) Access to Homicidal Means:  (Unable to assess.  Patient refused to speak) Identified Victim: Unable to assess.  Patient refused to speak History of harm to others?:  (Unable to assess.  Patient refused to speak) Assessment of Violence:  (Unable to assess.  Patient refused to speak) Violent Behavior Description: Unable to assess.  Patient refused to speak Does patient have access to weapons?:  (Unable to assess.  Patient refused to speak) Criminal Charges Pending?:  (Unable to assess.  Patient refused to speak) Does patient have a court date:  (Unable to assess.  Patient refused to speak) Prior Inpatient Therapy: Prior Inpatient Therapy:  (Unable to assess.  Patient refused to speak) Prior Outpatient Therapy: Prior Outpatient Therapy:  (Unable to assess.  Patient refused to speak) Does patient have an ACCT team?:  (Unable to assess.  Patient refused to speak) Does patient have Intensive In-House Services?  :  (Unable to assess.  Patient refused to speak) Does patient have [facility] services? :  (Unable to assess.  Patient refused to speak) Does patient have P4CC services?:  (  Unable to assess.  Patient refused to speak)  Current Facility-Administered Medications  Medication Dose Route Frequency Provider Last Rate Last Dose  . acetaminophen (TYLENOL) tablet 650 mg  650 mg Oral Once Lacretia Leigh, MD   Stopped at 12/03/14 2144   No current outpatient prescriptions on file.    Musculoskeletal: UTO, camera  Psychiatric Specialty Exam: Physical Exam  Review of Systems  Psychiatric/Behavioral: Positive for hallucinations (pt is clearly psychotiic).  All other systems reviewed and are  negative.   Blood pressure 127/104, pulse 109, temperature 99 F (37.2 C), temperature source Oral, resp. rate 18, SpO2 100 %.There is no height or weight on file to calculate BMI.  General Appearance: Bizarre and Disheveled  Eye Contact::  Fair  Speech:  None, grunted twice  Volume:  UTO, pt not speaking  Mood:  Dysphoric  Affect:  UTO, pt not speaking  Thought Process:  UTO, pt not speaking  Orientation:  UTO, pt not speaking  Thought Content:  UTO, pt not speaking  Suicidal Thoughts:  UTO, pt not speaking  Homicidal Thoughts:  UTO, pt not speaking  Memory:  UTO, pt not speaking  Judgement:  Impaired  Insight:  Lacking  Psychomotor Activity:  Restlessness  Concentration:  Poor  Recall:  UTO, pt not speaking  Fund of Knowledge:UTO, pt not speaking  Language: UTO, pt not speaking  Akathisia:  Possible  Handed:    AIMS (if indicated):     Assets:  Resilience Social Support  ADL's:  Impaired  Cognition: WNL  Sleep:      Medical Decision Making: New problem, with additional work up planned, Review of Psycho-Social Stressors (1), Review or order clinical lab tests (1), Established Problem, Worsening (2), Review of Medication Regimen & Side Effects (2) and Review of New Medication or Change in Dosage (2)  Treatment Plan Summary: Daily contact with patient to assess and evaluate symptoms and progress in treatment and Medication management  Plan:  Recommend psychiatric Inpatient admission when medically cleared.  Disposition:  -Admit to inpatient psychiatric hospital for safety and stabilization -Lithium level STAT (EDP ordered, pending)  Cody Mola, FNP-BC 12/04/2014 09:07 AM

## 2014-12-04 NOTE — ED Notes (Signed)
RN witnessed pt via camera attempting to throw himself off bed on to the floor; [employer] called to assist; Pt removed from bed and placed on mattress on the floor; side table and bed frame removed from room; Consulting civil engineer and Md notified;

## 2014-12-04 NOTE — ED Notes (Signed)
Pt noted to lay down in floor. Pt noted to have fluttery eyes. Pt placed in bed. Pt has been standing at attention since this morning prior to laying in the floor.

## 2014-12-05 ENCOUNTER — Emergency Department (HOSPITAL_COMMUNITY): Payer: Self-pay

## 2014-12-05 ENCOUNTER — Other Ambulatory Visit: Payer: Self-pay | Admitting: Emergency Medicine

## 2014-12-05 DIAGNOSIS — F250 Schizoaffective disorder, bipolar type: Secondary | ICD-10-CM

## 2014-12-05 MED ORDER — ZIPRASIDONE MESYLATE 20 MG IM SOLR
10.0000 mg | Freq: Once | INTRAMUSCULAR | Status: AC
  Administered 2014-12-05: 10 mg via INTRAMUSCULAR
  Filled 2014-12-05: qty 20

## 2014-12-05 MED ORDER — LORAZEPAM 1 MG PO TABS
1.0000 mg | ORAL_TABLET | Freq: Three times a day (TID) | ORAL | Status: DC | PRN
  Administered 2014-12-07 – 2014-12-09 (×7): 1 mg via ORAL
  Filled 2014-12-05 (×7): qty 1

## 2014-12-05 MED ORDER — ZIPRASIDONE MESYLATE 20 MG IM SOLR
20.0000 mg | Freq: Once | INTRAMUSCULAR | Status: AC
  Administered 2014-12-05: 20 mg via INTRAMUSCULAR
  Filled 2014-12-05: qty 20

## 2014-12-05 MED ORDER — NICOTINE 21 MG/24HR TD PT24
21.0000 mg | MEDICATED_PATCH | Freq: Every day | TRANSDERMAL | Status: DC
  Administered 2014-12-07 – 2014-12-10 (×4): 21 mg via TRANSDERMAL
  Filled 2014-12-05 (×6): qty 1

## 2014-12-05 MED ORDER — ONDANSETRON HCL 4 MG PO TABS: 4.0000 mg | ORAL_TABLET | Freq: Three times a day (TID) | ORAL | Status: DC | PRN

## 2014-12-05 MED ORDER — STERILE WATER FOR INJECTION IJ SOLN
INTRAMUSCULAR | Status: AC
  Filled 2014-12-05: qty 10

## 2014-12-05 MED ORDER — ALUM & MAG HYDROXIDE-SIMETH 200-200-20 MG/5ML PO SUSP: 30.0000 mL | ORAL | Status: DC | PRN

## 2014-12-05 MED ORDER — IBUPROFEN 400 MG PO TABS: 600.0000 mg | ORAL_TABLET | Freq: Three times a day (TID) | ORAL | Status: DC | PRN

## 2014-12-05 NOTE — Progress Notes (Signed)
Buddy Duty  [facility] & Eligibility Specialist Partnership for [facility] [phone]  Consulted by Mordecai Rasmussen, LCSW, regarding patients need for primary care and the [facility] orange card. Patients mother at bedside given the orange card application and instructions. I recommended patients mother to follow up with [facility] [place] for mental health needs and establishing primary care once pt is discharged. Pt will be linked with a P4CC case manager once orange card is obtained. My contact information provided for any future questions or concerns. No other [facility] Eligibility Specialist needs identified at this time.

## 2014-12-05 NOTE — ED Notes (Signed)
Pt's mother reported to the nurse that he told her that he could not see her.  She is worried about his vision.  I went to assess pt, patient reports that he can see her now after I turned lights on.  Patient is not really elaborating about what he reported to his mother as his speech is still pressured and restricted.  Patient will respond at times with a short answer, but only when encouraged and sometimes not at all.  MD informed  PERLLA

## 2014-12-05 NOTE — ED Notes (Signed)
EEG at bedside.

## 2014-12-05 NOTE — ED Notes (Signed)
Pt. Stood up in room, sitter closed door. Pt. Took a few steps toward sitter and the door. This RN had [employer] on standby just in case. Sitter was able to get pt. Back into bed with out harm.

## 2014-12-05 NOTE — ED Notes (Signed)
Pt restrained physically using a therapeutic hold by this nurse initially because of attempted escape, pt was redirected to room.  After the 3rd subsequent attempted escape, security was called, pt placed in 4 point hold.  Dr. Estell Harpin notified and order obtained for restraint.  4 point soft wrist and ankle restraints applied.

## 2014-12-05 NOTE — ED Notes (Addendum)
Tech walked pt to the bathroom. Pt stated after using the bathroom and returning to the room that he needed to "go to the bathroom". Tech walked pt back to the bathroom and pt touched the knobs on the sink. Pt was asked to return to room and pt complied.

## 2014-12-05 NOTE — ED Notes (Signed)
Speaking with pt. Pt non verbal. Pt noted rigidity. MD notified

## 2014-12-05 NOTE — [facility] Counselor (Signed)
RN states that psych consult will be removed.  Wolfgang Phoenix, [facility] Triage Specialist

## 2014-12-05 NOTE — Progress Notes (Signed)
LCSW completed Care Coordination referral with Jake Shark at [facility] [place]. [facility] in effort to expedite the referral.  [name], MSW Clinical Social Work: Emergency Room [phone]

## 2014-12-05 NOTE — Progress Notes (Signed)
Referral was followed-up at: [facility] - per Christiane Ha, "no [facility] beds today, MD to review referral". Per Christiane Ha, 1 adult and some adolescent beds.  At capacity: [facility] - per Jerilynn Som, d/c tomorrow, no beds today. [facility] - per [facility] - per [facility], no adult beds, just adolescent beds today.  Melbourne Abts, LCSWA Disposition staff 12/06/2014 4:39 PM

## 2014-12-05 NOTE — ED Notes (Signed)
This RN spoke with pt. Mother about putting pt. In 4 point restraints. This RN explained the situation and all the alternative methods used before using the restraints. Pt. Mother verbalized understanding and This RN told pt. Mother we would keep her up to date.

## 2014-12-05 NOTE — Progress Notes (Signed)
LCSW still following case and working with mom to understand patient's current psychosis and resources. LCSW will make care coordination referral for patient. Mother reports they have applied for disability, but he has been denied.   LCSW called P4CC worker Felicia for assistance with PCP and other options that orange card would honor for mental health outpatient other than [facility] as mom states that is just not consistent enough to meet patient's needs.  [facility] [place] was suggested as they honor the orange card as well as have case manager follow him in community.  Mother agreeable and given application and information of follow up.  Mother also inquiring about long acting injections rather than PO medications.  LCSW called [facility] with regards to recommendations or starting something like this in the ED.    LCSW also gave information and packet for mother and patient to fill out for mental health power of attorney.  Continue to follow.  [name], MSW Clinical Social Work: Emergency Room [phone]

## 2014-12-05 NOTE — ED Notes (Signed)
Dr. Estell Harpin approved restraint use and is placing orders

## 2014-12-05 NOTE — ED Notes (Signed)
Pt sitting in recliner fluttering eyes. Pt's eyes appear to be tearing.

## 2014-12-05 NOTE — Procedures (Signed)
ELECTROENCEPHALOGRAM REPORT   Patient: Cody [name]       Room #: C22 EEG No. ID: [id number] Age: 24 y.o.        Sex: male Referring Physician: Fredderick Phenix Report Date:  12/05/2014        Interpreting Physician: Thana Farr  History: [name] is an 24 y.o. male who is non-communicative with bizarre behavior.    Medications:  .  acetaminophen (TYLENOL) tablet 650 mg, 650 mg, Oral, .  alum & mag hydroxide-simeth (MAALOX/MYLANTA) 200-200-20 MG/5ML suspension 30 mL, 30 mL, Oral, PRN .  ibuprofen (ADVIL,MOTRIN) tablet 600 mg, 600 mg, Oral, Q8H PRN .  LORazepam (ATIVAN) tablet 1 mg, 1 mg, Oral, Q8H PRN .  nicotine (NICODERM CQ - dosed in mg/24 hours) patch 21 mg, 21 mg, Transdermal, Daily .  ondansetron (ZOFRAN) tablet 4 mg, 4 mg, Oral, Q8H PRN   Conditions of Recording:  This is a 16 channel EEG carried out with the patient in the awake, drowsy and asleep states.  Patient uncooperative during wakefulness  Description:  The waking background activity is difficult to evaluate due to the patient's cooperation.  During most of wakefulness the patient has his eyes open.  The background is dominated by beta activity.  He does have short periods of eye closure when a posterior background rhythm can be evaluated.  The posterior background rhythm is poorly sustained due to cooperation but achieves a 8-9 Hz alpha activity.  Low voltage fast activity, poorly organized, is seen anteriorly and is at times superimposed on more posterior regions.  A mixture of theta and alpha rhythms are seen from the central and temporal regions. The patient drowses with slowing to irregular, low voltage theta and beta activity.   The patient goes in to a light sleep with symmetrical sleep spindles, vertex central sharp transients and irregular slow activity.  No epileptiform activity is noted. Hyperventilation was not performed.  Intermittent photic stimulation was performed and elicits a symmetrical driving response but  fails to elicit any abnormalities.  IMPRESSION: Normal electroencephalogram, awake, asleep and with activation procedures. There are no focal lateralizing or epileptiform features.   Thana Farr, MD [facility] [phone] 12/05/2014, 12:35 PM

## 2014-12-05 NOTE — ED Notes (Signed)
Restraints discontinued, patient meets criteria for release.  Patient is no longer attempting to leave and is resting peacefully in bed with sitter at bedside.

## 2014-12-05 NOTE — ED Notes (Signed)
Pt was asked to sit down multiple times, but would not comply. Pt had to be placed back in chair twice.

## 2014-12-05 NOTE — ED Notes (Signed)
Ambulated to bathroom with standby assist.

## 2014-12-05 NOTE — Progress Notes (Signed)
LCSW located and spoke to patient mother: Cody [name] with regards to son and to understand patient's behaviors at home. Mother reports she is relieved to know he is safe and reports that Cody [name] failed to fill his risperidone on the last visit June 7th. Patient had reported to mother that he did not feel right and started acting bizarre. Mother reports patient is part of PSR program: [facility] during the day and attends regularly.  Mother reports patient lives with her full time and she takes care of him including managing his medications.  Mother was able to report her schedule for patient and at this time reports patient is very afraid and most likely will not talk, eat, or do anything with staff because of fear.  Patient able to acknowledge LCSW when told she called his mother. He reported thank you. Patient has a blank stare and is tearful.  Words are jumbled together.  Mom reports she does not want him admitted if there is any way as patient does not do well in new situations or places that he does not know.  Mom reports no need for ACT team as she reports she feels confident in caring for him and assisting with his needs.  LCSW is going to make a care coordination referral for patient as he has been here 5 times in the last 6 months.  Patient would benefit from a monthly injection instead of PO Risperidone if appropriate per MD.  Mother agrees and working to speak with doctor about this plan.  Mother will be here this afternoon.  [name], MSW Clinical Social Work: Emergency Room [phone]

## 2014-12-05 NOTE — ED Notes (Signed)
Pt got up and walked out of the room. The Tech asked pt if he needed to use the bathroom. The pt walked to the bathroom and walked over to the sink. The pt began touching the sink. The Tech asked pt if he needed to use the bath. The pt then walked over to the toilet and placed tips of fingers in the toilet and tried to put finger's in mouth. Tech was able to grab pt's hand before his fingers went into his mouth. The Tech helped pt wash hands and pt was placed in a recliner. The pt was given water and took 2 sips.

## 2014-12-05 NOTE — ED Notes (Signed)
Pt resting. No acute distress noted. 

## 2014-12-05 NOTE — ED Notes (Signed)
Called staffing to request a sitter 

## 2014-12-05 NOTE — ED Notes (Signed)
EEG-tech on the way to do bedside EEG.

## 2014-12-05 NOTE — Progress Notes (Signed)
EEG Completed; Results Pending  

## 2014-12-05 NOTE — ED Notes (Signed)
EEG in process.  Pt is now verbal and following commands.

## 2014-12-05 NOTE — ED Provider Notes (Signed)
Called by ED RN to bedside. Pt sitting on side of bed, not eating, not talking. Pt apparently has had similar symptoms throughout his ED stay which seem to improve after IM geodon. Will dose IM geodon now.   Samuel Jester, DO 12/05/14 1512

## 2014-12-05 NOTE — ED Notes (Signed)
Pt mother at bedside

## 2014-12-05 NOTE — ED Notes (Signed)
Pt.is more alert.will put bed back in room.more eye contact now.

## 2014-12-05 NOTE — Progress Notes (Addendum)
Referral for IP treatment has been followed-up at: Under Review:  [facility]    On wait-list at: [facility] (at capacity this morning)  Declined at: [facility] - per [place], due to behavioral acuity  [place]  At capacity:  [facility]  CSW will continue to seek placement. Per Dahlia Client, CSW at [facility] referral being made as well.   Reece Levy, MSW, Theresia Majors  [phone]

## 2014-12-05 NOTE — ED Notes (Signed)
Attempting to encourage patient to eat.  We were only able to get patient to eat a few pieces of fruit.

## 2014-12-05 NOTE — ED Notes (Signed)
Was able to communicate briefly with patient who had limited responses to questioning.  I asked him if he knows where he is and he responded that he is in the hospital.  I also asked him if he knows why he is here and he responded that "I was off my meds, I got lost."  When asked if he would like to see his mother, he replied, "Yes, I would like to see my mother."  When asked if he is hungry, patient replied "yes" but refuses to eat, even when fed.  I was only able to give him a few sips of orange juice, but he refused everything else on his plate and only had a couple sips of his beverage.  Would not respond to further questions.  Currently the tech is taking patient via steady (pt movement device) to get showered before his mother arrives.

## 2014-12-06 MED ORDER — BENZTROPINE MESYLATE 1 MG PO TABS
0.5000 mg | ORAL_TABLET | Freq: Two times a day (BID) | ORAL | Status: DC
  Administered 2014-12-06 – 2014-12-08 (×5): 0.5 mg via ORAL
  Filled 2014-12-06 (×5): qty 1

## 2014-12-06 MED ORDER — RISPERIDONE 0.5 MG PO TABS
1.0000 mg | ORAL_TABLET | Freq: Every day | ORAL | Status: DC
  Administered 2014-12-06: 1 mg via ORAL
  Filled 2014-12-06: qty 2

## 2014-12-06 MED ORDER — HYDROXYZINE PAMOATE 50 MG PO CAPS
50.0000 mg | ORAL_CAPSULE | Freq: Three times a day (TID) | ORAL | Status: DC | PRN
  Administered 2014-12-09: 50 mg via ORAL
  Filled 2014-12-06 (×2): qty 1

## 2014-12-06 MED ORDER — GABAPENTIN 100 MG PO CAPS
200.0000 mg | ORAL_CAPSULE | Freq: Three times a day (TID) | ORAL | Status: DC
  Administered 2014-12-06 – 2014-12-10 (×13): 200 mg via ORAL
  Filled 2014-12-06 (×19): qty 2

## 2014-12-06 MED ORDER — RISPERIDONE 0.5 MG PO TABS
1.0000 mg | ORAL_TABLET | Freq: Two times a day (BID) | ORAL | Status: DC
  Administered 2014-12-06 – 2014-12-08 (×4): 1 mg via ORAL
  Filled 2014-12-06 (×4): qty 2

## 2014-12-06 MED ORDER — LITHIUM CARBONATE 300 MG PO CAPS
300.0000 mg | ORAL_CAPSULE | Freq: Two times a day (BID) | ORAL | Status: DC
  Administered 2014-12-06 – 2014-12-10 (×9): 300 mg via ORAL
  Filled 2014-12-06 (×9): qty 1

## 2014-12-06 NOTE — ED Notes (Signed)
Family at bedside with the patient.  Cody [name] with CSW to speak to family.

## 2014-12-06 NOTE — Consult Note (Signed)
[facility] Face-to-Face Psychiatry Consult   Reason for Consult:  Schizoaffective, non compliance with medication  Referring Physician:  EDP Patient Identification: Cody [name] MRN:  [id number] Principal Diagnosis: Schizoaffective disorder, bipolar type Diagnosis:   Patient Active Problem List   Diagnosis Date Noted  . Schizoaffective disorder, bipolar type [F25.0]     Total Time spent with patient: 1 hour  Subjective:   [name] is a 24 y.o. male patient admitted with psychosis.  HPI:  Cody [name] is a 24 y.o. Male seen face to face for psychiatric consultation and evaluation of schizoaffective disorder, non compliant with medication and presented with increased symptoms of bizarre behaviors, non communicative and increased blinking of his eyes and appeared anxious and scared. Reportedly he received Geodon IM for eloping from [facility] which has no impact on his mental status. Patient has restarted previous medication from [facility] in May 2016, and started slowly responding with single words and asking for coke and saying Yes or No etc. Patient does not have EPS or muscular rigidity. He is able to walk on hall way and used rest room during my visit. Spoke with patient staff RN and LCSW who has been in contact with his mother. Review of medical records indicated he has at least five encounter to the hospital system. Patient has poor insight and judgment. He has poor participation in therapeutic activities in hospitalization. He has history of paranoid delusions and emotionally unstable with crying spells. He has substance abuse especially smoking THC. Patient UDS is positive for cannabis and his BAL is <5. Patient was never graduating from school due to mental illness, unable to hold job more than two months and reportedly father for 44 years old and may be for 62 months old.   HPI Elements:   Location:  psychosis, bipolar disorder. Quality:  poor due to non compliance. Severity:  pooly communicative  and needs prompts for daily routine. Timing:  non compliance and lost his way home. Duration:  few days. Context:  psychosocial stresses.  Past Medical History: No past medical history on file. No past surgical history on file. Family History: No family history on file. Social History:  History  Alcohol Use: Not on file     History  Drug Use Not on file    History   Social History  . Marital Status: Single    Spouse Name: N/A  . Number of Children: N/A  . Years of Education: N/A   Social History Main Topics  . Smoking status: Not on file  . Smokeless tobacco: Not on file  . Alcohol Use: Not on file  . Drug Use: Not on file  . Sexual Activity: Not on file   Other Topics Concern  . Not on file   Social History Narrative  . No narrative on file   Additional Social History:    History of alcohol / drug use?:  (Unable to assess.  Patient refused to speak.)                     Allergies:  No Known Allergies  Labs:  Results for orders placed or performed during the hospital encounter of 12/03/14 (from the past 48 hour(s))  CBG monitoring, ED     Status: Abnormal   Collection Time: 12/04/14  7:18 PM  Result Value Ref Range   Glucose-Capillary 113 (H) 65 - 99 mg/dL    Vitals: Blood pressure 115/69, pulse 91, temperature 99.4 F (37.4 C), temperature source Oral, resp.  rate 16, SpO2 100 %.  Risk to Self: Suicidal Ideation:  (Unable to assess.  Patient refused to speak) Suicidal Intent:  (Unable to assess.  Patient refused to speak) Is patient at risk for suicide?:  (Unable to assess.  Patient refused to speak) Suicidal Plan?:  (Unable to assess.  Patient refused to speak) Access to Means:  (Unable to assess.  Patient refused to speak) What has been your use of drugs/alcohol within the last 12 months?:  (Unable to assess.  Patient refused to speak) How many times?:  (Unable to assess.  Patient refused to speak) Other Self Harm Risks:  (Unable to assess.   Patient refused to speak) Triggers for Past Attempts:  (Unable to assess.  Patient refused to speak) Intentional Self Injurious Behavior:  (Unable to assess.  Patient refused to speak) Risk to Others: Homicidal Ideation:  (Unable to assess.  Patient refused to speak) Thoughts of Harm to Others:  (Unable to assess.  Patient refused to speak) Current Homicidal Intent:  (Unable to assess.  Patient refused to speak) Current Homicidal Plan:  (Unable to assess.  Patient refused to speak) Access to Homicidal Means:  (Unable to assess.  Patient refused to speak) Identified Victim: Unable to assess.  Patient refused to speak History of harm to others?:  (Unable to assess.  Patient refused to speak) Assessment of Violence:  (Unable to assess.  Patient refused to speak) Violent Behavior Description: Unable to assess.  Patient refused to speak Does patient have access to weapons?:  (Unable to assess.  Patient refused to speak) Criminal Charges Pending?:  (Unable to assess.  Patient refused to speak) Does patient have a court date:  (Unable to assess.  Patient refused to speak) Prior Inpatient Therapy: Prior Inpatient Therapy:  (Unable to assess.  Patient refused to speak) Prior Outpatient Therapy: Prior Outpatient Therapy:  (Unable to assess.  Patient refused to speak) Does patient have an ACCT team?:  (Unable to assess.  Patient refused to speak) Does patient have Intensive In-House Services?  :  (Unable to assess.  Patient refused to speak) Does patient have [facility] services? :  (Unable to assess.  Patient refused to speak) Does patient have P4CC services?:  (Unable to assess.  Patient refused to speak)  Current Facility-Administered Medications  Medication Dose Route Frequency Provider Last Rate Last Dose  . acetaminophen (TYLENOL) tablet 650 mg  650 mg Oral Once Lorre Nick, MD   Stopped at 12/03/14 2144  . alum & mag hydroxide-simeth (MAALOX/MYLANTA) 200-200-20 MG/5ML suspension 30 mL  30 mL Oral  PRN [name], MD      . benztropine (COGENTIN) tablet 0.5 mg  0.5 mg Oral BID Tilden Fossa, MD   0.5 mg at 12/06/14 1610  . gabapentin (NEURONTIN) capsule 200 mg  200 mg Oral TID Tilden Fossa, MD      . hydrOXYzine (VISTARIL) capsule 50 mg  50 mg Oral TID PRN Tilden Fossa, MD      . ibuprofen (ADVIL,MOTRIN) tablet 600 mg  600 mg Oral Q8H PRN [name], MD      . lithium carbonate capsule 300 mg  300 mg Oral BID WC Tilden Fossa, MD   300 mg at 12/06/14 0809  . LORazepam (ATIVAN) tablet 1 mg  1 mg Oral Q8H PRN [name], MD      . nicotine (NICODERM CQ - dosed in mg/24 hours) patch 21 mg  21 mg Transdermal Daily [name], MD   21 mg at 12/05/14 1000  .  ondansetron (ZOFRAN) tablet 4 mg  4 mg Oral Q8H PRN [name], MD      . risperiDONE (RISPERDAL) tablet 1 mg  1 mg Oral Daily Tilden Fossa, MD   1 mg at 12/06/14 3748   Current Outpatient Prescriptions  Medication Sig Dispense Refill  . benztropine (COGENTIN) 0.5 MG tablet Take 0.5 mg by mouth 2 (two) times daily.     Marland Kitchen gabapentin (NEURONTIN) 100 MG capsule Take 200 mg by mouth 3 (three) times daily.    . hydrOXYzine (VISTARIL) 50 MG capsule Take 50 mg by mouth 3 (three) times daily as needed for anxiety ((sleep)).     Marland Kitchen lithium carbonate 300 MG capsule Take 300 mg by mouth 2 (two) times daily with a meal.    . nicotine (NICODERM CQ - DOSED IN MG/24 HOURS) 21 mg/24hr patch Place 21 mg onto the skin daily.    . risperiDONE (RISPERDAL) 1 MG tablet Take 1 mg by mouth daily.      Musculoskeletal: Strength & Muscle Tone: decreased Gait & Station: normal Patient leans: N/A  Psychiatric Specialty Exam: Physical Exam Full physical performed in Emergency Department. I have reviewed this assessment and concur with its findings.   ROS not completed due to poor verbal response and insight and judgment  Blood pressure 115/69, pulse 91, temperature 99.4 F (37.4 C), temperature source Oral, resp. rate 16, SpO2 100 %.There is no  height or weight on file to calculate BMI.  General Appearance: Bizarre and Guarded  Eye Contact::  Minimal  Speech:  Blocked and Slow  Volume:  Decreased  Mood:  Anxious and Depressed  Affect:  Depressed, Flat, Labile and Restricted  Thought Process:  Coherent and Goal Directed  Orientation:  Full (Time, Place, and Person)  Thought Content:  Paranoid Ideation and Rumination  Suicidal Thoughts:  No  Homicidal Thoughts:  No  Memory:  Immediate;   Fair Recent;   Poor  Judgement:  Impaired  Insight:  Lacking  Psychomotor Activity:  Psychomotor Retardation  Concentration:  Poor  Recall:  Poor  Fund of Knowledge:Fair  Language: Fair  Akathisia:  Negative  Handed:  Right  AIMS (if indicated):     Assets:  Desire for Improvement Housing Intimacy Leisure Time Physical Health Resilience Social Support  ADL's:  Intact  Cognition: Impaired,  Mild  Sleep:      Medical Decision Making: Review of Psycho-Social Stressors (1), Review or order clinical lab tests (1), Established Problem, Worsening (2), Review of Last Therapy Session (1), Review or order medicine tests (1), Review of Medication Regimen & Side Effects (2) and Review of New Medication or Change in Dosage (2)  Treatment Plan Summary: patient with schizoaffective disorder, cannabis abuse, non compliant with medication management and lost him self in town, has bizarre behaviors, isolated, withdrawn, flat affect, poor insight, judgment and risk of elopement. He has poorly respond to verbal stimuli, no EPS and needs higher level of care for medication treatment and stabilization.   Daily contact with patient to assess and evaluate symptoms and progress in treatment and Medication management  Plan: Safety concern: Possible elopement - continue safety sitter Psychosis: Risperidone 1 mg PO BID and may consider Risperidone consta as he is non compliance with oral medication out side hospital Bipolar disorder: Lithium 300 mg BID - can  be titrated to higher dose when not achieved clinical response and therapeutic range Mood swings: neurontin 200 mg TID EPS: Benzotropin 0.5 mg BID to prevent dystonia and akathesia Anxiety:  Ativan 1 mg BID/PRN   Recommend psychiatric Inpatient admission when medically cleared. Supportive therapy provided about ongoing stressors.   Appreciate psychiatric consultation Please contact [phone] or [phone] if needs further assistance   Disposition: Admit to acute psych inpatient hospitalization for stabilization and medication management.   Craig Ionescu,JANARDHAHA R. 12/06/2014 10:24 AM

## 2014-12-06 NOTE — ED Notes (Signed)
Pt. Yelled and then stood up and took off down the hall. Sitter chased pt. [employer] was in pod C and caught pt. [employer] and sitter escorted pt. Back to room and back in to bed.

## 2014-12-06 NOTE — ED Notes (Signed)
Security at bedside with the patient to walk the patient.

## 2014-12-06 NOTE — ED Notes (Signed)
Pt with blankets pulled over head; remains nonverbal; willing to have VS taken at this time

## 2014-12-06 NOTE — Progress Notes (Addendum)
12:01:  Call from [place] at [facility] reports patient was accepted for care coordination and care coordinator is Kyra Leyland:  [phone]   Patient's behavior this morning has been erratic.  Patient attempted to go to the bathroom with escort, but walked back out.  He is more restless today attempting to escape and medication ordered is not helping with behaviors.  LCSW asked again for DC summary from [facility] to be reviewed with staff/MD to have patient restart his medication.  Patient remains nonverbal, looking straight ahead.  Mood is constricted and affect is congruent.  Patient appears to remain fearful of staff and place.  Mother to be here this afternoon for lunch.  LCSW spoke with [facility] with regards to having Dr. Shela Commons see patient face to face for any other recommendations.    [name], MSW Clinical Social Work: Emergency Room [phone]

## 2014-12-06 NOTE — ED Notes (Signed)
Pt. Declined vital signs. RN decided not to agitate pt. Any more than necessary

## 2014-12-06 NOTE — ED Notes (Signed)
Pt's mother called for an update; informed mother of latest plan of car; mother to visit 6/24 at 12:30pm.  Pt resting at this time; sitter at bedside

## 2014-12-06 NOTE — ED Notes (Signed)
Pt awake, still nonverbal at this time. Pt has not eaten his breakfast. Sitter at bedside

## 2014-12-06 NOTE — ED Notes (Signed)
Pt resting with eyes closed ; sitter at bedside  

## 2014-12-06 NOTE — ED Notes (Signed)
Meal tray at bedside.  

## 2014-12-06 NOTE — ED Notes (Signed)
Pt standing in doorway of room; assisted to bathroom. Attempted to redirect pt to room, in which he complied. Pt appears frightened and distant. Asked if hallucinations where present, since pt was staring into the corners of the rooms. Pt admits to voices; unable to recall what voices are saying. Pt verbally c/o headache, body aches, and feeling tired. Pt assisted to bed, but pt stood back up. Walked with patient around pod, steady gait noted, pt attempted to walk toward exit door, was redirected to room without difficulty. Pt speaking in slow, pressure, short sentences. Sitter at bedside.

## 2014-12-06 NOTE — ED Notes (Signed)
Drinks and snacks provided.

## 2014-12-06 NOTE — ED Notes (Signed)
Pt able to take PO meds with direction. Had to place medication cup into patient's hand and direct medications to mouth. Pt remains nonverbal at this time. SItter at bedside attempting to help feed patient. Pt's mom came by at 1800; mother aware of visiting hours; reports will visit tomorrow

## 2014-12-06 NOTE — ED Notes (Signed)
Patient out of the room and walking toward the exits when stopped by sitters and RNs.  Patients gait was quick and controlled on the way out of the room but very slow and stiff when being escorted back to the room.

## 2014-12-07 MED ORDER — RISPERIDONE 1 MG PO TABS: 1.0000 mg | ORAL_TABLET | Freq: Two times a day (BID) | ORAL | Status: DC

## 2014-12-07 MED ORDER — LITHIUM CARBONATE 300 MG PO CAPS: 300.0000 mg | ORAL_CAPSULE | Freq: Two times a day (BID) | ORAL | Status: DC

## 2014-12-07 MED ORDER — BENZTROPINE MESYLATE 0.5 MG PO TABS: 0.5000 mg | ORAL_TABLET | Freq: Two times a day (BID) | ORAL | Status: DC

## 2014-12-07 MED ORDER — GABAPENTIN 100 MG PO CAPS: 200.0000 mg | ORAL_CAPSULE | Freq: Three times a day (TID) | ORAL | Status: DC

## 2014-12-07 NOTE — ED Notes (Signed)
Pt ate his dinner and began having proper conversations with his father, Corporate treasurer.  Pt is smiling and oriented x 4.  Pt is no longer trying to leave and is ok with his father leaving to go home for the night.

## 2014-12-07 NOTE — ED Notes (Signed)
This RN noticed pt appeared out of sorts and not responding to questions, looking blankly with minimal reaction to verbal stimuli.  Pt was being discharged so this nurse contacted EDP to have visual assessment of patient.  [employer] and security needed when pt tried to push his way through RN to get out of room and "go home".  Pt blankly starred at [employer] when asked to sit down on bed.  Pt refused to get his vitals and continued to try to push his way out the door.  EDP came to visually assess pt, and reversed his discharge to admit.  TTS was called for reevaluation.  Father at bedside stating to his son is not at his baseline at this time and is acting the way he was when he was brought to ER.  Sitter at bedside.  Security aware to check in on pt time to time.

## 2014-12-07 NOTE — Progress Notes (Addendum)
RN Erskine Squibb at [facility] called Clinical research associate inquiring about patient's urinalysis, urine drug screen, current weight, and IVC papers.  Writer to contact pt's nurse.  Melbourne Abts, LCSWA Disposition staff 12/07/2014 5:10 PM

## 2014-12-07 NOTE — ED Notes (Signed)
Pt getting up and trying to walk out of the room. Sitter attempting to keep him in the room. This RN at the bedside and redirected pt to sit back down and pt did what was asked. A few minutes later, pt up trying to get out of room again. [employer] in pod assisting with redirecting pt back to bed.

## 2014-12-07 NOTE — Progress Notes (Signed)
   12/07/14 1300  Clinical Encounter Type  Visited With Patient  Visit Type Other (Comment) (Patient request for Bible)  Spiritual Encounters  Spiritual Needs Literature  PT requested Bible as CH making walk-through.  Bible provided to PT

## 2014-12-07 NOTE — ED Notes (Signed)
Breakfast Meal Tray delivered.

## 2014-12-07 NOTE — ED Notes (Signed)
Pt back in room with sitter

## 2014-12-07 NOTE — ED Notes (Signed)
Pt ready for discharged, signed for belongings and mom called to come pick him up.

## 2014-12-07 NOTE — ED Notes (Addendum)
EDP at bedside, cancelled discharge instructions due to pt behavioral deterioration, TTS contacted for reevalution, dad at bedside.  Pt remains calm with father.

## 2014-12-07 NOTE — Discharge Instructions (Signed)
°Emergency Department Resource Guide °1) Find a Doctor and Pay Out of Pocket °Although you won't have to find out who is covered by your insurance plan, it is a good idea to ask around and get recommendations. You will then need to call the office and see if the doctor you have chosen will accept you as a new patient and what types of options they offer for patients who are self-pay. Some doctors offer discounts or will set up payment plans for their patients who do not have insurance, but you will need to ask so you aren't surprised when you get to your appointment. ° °2) Contact Your Local Health Department °Not all health departments have doctors that can see patients for sick visits, but many do, so it is worth a call to see if yours does. If you don't know where your local health department is, you can check in your phone book. The CDC also has a tool to help you locate your state's health department, and many state websites also have listings of all of their local health departments. ° °3) Find a Walk-in Clinic °If your illness is not likely to be very severe or complicated, you may want to try a walk in clinic. These are popping up all over the country in pharmacies, drugstores, and shopping centers. They're usually staffed by nurse practitioners or physician assistants that have been trained to treat common illnesses and complaints. They're usually fairly quick and inexpensive. However, if you have serious medical issues or chronic medical problems, these are probably not your best option. ° °No Primary Care Doctor: °- Call [facility] Connect at  832-8000 - they can help you locate a primary care doctor that  accepts your insurance, provides certain services, etc. °- Physician Referral Service- 1-800-533-3463 ° °Chronic Pain Problems: °Organization         Address  Phone   Notes  °[facility]  (336) 297-2271 Patients need to be referred by their primary care doctor.  ° °Medication  Assistance: °Organization         Address  Phone   Notes  °[place] [facility] [address] °[place], [place] [address] (336) 641-8030 --Must be a resident of [place] °-- Must have NO insurance coverage whatsoever (no Medicaid/ Medicare, etc.) °-- The pt. MUST have a primary care doctor that directs their care regularly and follows them in the community °  °[facility]  (866) 331-1348   °[employer]  (888) 892-1162   ° °Agencies that provide inexpensive medical care: °Organization         Address  Phone   Notes  °[facility]  (336) 832-8035   °[facility]    (336) 832-7272   °[facility] [address] °[place], [place] [address] (336) 832-4777   °[facility] [address], °[place] (336) 271-4999   °[facility]    (336) 373-0678   °[facility]    (336) 272-1050   °[facility] ° [address], [place] Phone:  (336) 832-4444, Fax:  (336) 832-4440 Hours of Operation:  9 am - 6 pm, M-F.  Also accepts Medicaid/Medicare and self-pay.  °[facility] ° [address], [place] Phone: (336) 832-3150, Fax: (336) [phone]. Hours of Operation:  8:30 am - 5:30 pm, M-F.  Also accepts Medicaid and self-pay.  °[facility] [place] [address], [place] Phone: (336) 878-6027   °[facility] [address], [place], [place] (336)723-1848, Ext. 123 Mondays & Thursdays: 7-9 AM.  First 15 patients are seen on a first come, first serve basis. °  ° °Medicaid-accepting [place] Providers: ° °Organization         Address  Phone   Notes  °[facility] [address], [place] (336) 641-2100 Also accepts self-pay patients.  °[facility] [address], [place] ° (336) 856-9996   °[facility] [address], [place]  (336) 288-8857   °[facility] [address] [place] [address], [place] (336) 299-7000   °Veita Bland [address], [place]  ° (336) 373-1557 Only accepts [place] Access Medicaid patients after they have their name applied to their card.  ° °Self-Pay (no insurance) in [place]: ° °Organization         Address  Phone   Notes  °Sickle Cell Patients, [facility] [address], [place] (336) 832-1970   °[facility] [address], [place] (336) 832-4400   °[facility] ° [address], [place] (336) 992-4800   °[facility]/Dr. Osei-Bonsu ° [address] [place] [address], [place] or [address], [place] (336) 841-8500 Phone number for both [place] and [place] locations is the same.  °[facility] [address], [place] (336) 299-0000   °[facility] [address] [place] [address], [place] or [address] (336) 852-7530 °(336) 878-2260   °[facility] [address], [place] (336) 350-1642, phone; (336) 294-5005, fax Sees patients 1st and 3rd Saturday of every month.  Must not qualify for public or private insurance (i.e. Medicaid, Medicare, [place] Health Choice, Veterans' Benefits) • Household income should be no more than 200% of the poverty level •The clinic cannot treat you if you are pregnant or think you are pregnant • Sexually transmitted diseases are not treated at the clinic.  ° ° °Dental Care: °Organization         Address  Phone  Notes  °[place] [facility] [address], [place] (336) 641-6152 Accepts children up to age 21 who are enrolled in Medicaid or [place] Health Choice; pregnant women with a Medicaid card; and children who have applied for Medicaid or [place] Health Choice, but were declined, whose parents can pay a reduced fee at time of service.  °[place]  [facility] [place]  [address], [place] (336) 641-7733 Accepts children up to age 21 who are enrolled in Medicaid or [place] Health Choice; pregnant women with a Medicaid card; and children who have applied for Medicaid or [place] Health Choice, but were declined, whose parents can pay a reduced fee at time of service.  °[facility] ° [address], [place] (336) 641-4533 Patients are seen by appointment only. Walk-ins are not accepted. [facility] will see patients 18 years of age and older. °Monday - Tuesday (8am-5pm) °Most Wednesdays (8:30-5pm) °$30 per visit, cash only  °[facility] ° [address], [place] (336) 641-4533 Patients are seen by appointment only. Walk-ins are not accepted. [facility] will see patients 18 years of age and older. °One   Wednesday Evening (Monthly: Volunteer Based).  $30 per visit, cash only  °[facility]  (919) 537-3737 for adults; Children under age 4, call Graduate Pediatric Dentistry at ([phone]) [phone]. Children aged 4-14, please call (919) [phone] to request a pediatric application. ° Dental services are provided in all areas of dental care including fillings, crowns and bridges, complete and partial dentures, implants, gum treatment, root canals, and extractions. Preventive care is also provided. Treatment is provided to both adults and children. °Patients are selected via a lottery and there is often a waiting list. °  °[facility] [address], °[place] ° (336) 763-8833 [email or web address] °  °[facility] [address], [place], [place] (336)723-1848, Ext. 123 Second and Fourth Thursday of each month, opens at 6:30 AM; Clinic ends at 9 AM.  Patients are seen on a first-come first-served basis, and a limited number are seen during each clinic.  ° °[facility] ° [address], [place], [place] (336) 723-7904    Eligibility Requirements °You must have lived in [place], or [place] counties for at least the last three months. °  You cannot be eligible for state or federal sponsored healthcare insurance, including Veterans Administration, Medicaid, or Medicare. °  You generally cannot be eligible for healthcare insurance through your employer.  °  How to apply: °Eligibility screenings are held every Tuesday and Wednesday afternoon from 1:00 pm until 4:00 pm. You do not need an appointment for the interview!  °[facility] [address], [place], [place] 336-631-2330   °[facility]  336-342-8273   °[place]  336-703-3100   °[facility]  336-570-6415   ° °Behavioral Health Resources in the Community: °Intensive Outpatient Programs °Organization         Address  Phone  Notes  °[facility] [address], [place], [place] 336-878-6098   °[facility] [address], [place], [place] 336-832-9800   °[facility] [address], [place], [place] ° 336-882-2125   °[facility] [address],  °[place], [place] 1-800-853-5163 or 336-641-4981   °Substance Abuse Resources °Organization         Address  Phone  Notes  °[facility]  336-882-2125   °[facility]  336-784-9470   °[facility]  336-285-9073   °[facility]  336-845-3988   °Residential & Outpatient Substance Abuse Program  1-800-659-3381   °Psychological Services °Organization         Address  Phone  Notes  °[facility]  336- 832-9600   °[facility]  336- 378-7881   °[facility] [address], [place] 1-800-853-5163 or 336-641-4981   ° °Mobile Crisis Teams °Organization         Address  Phone  Notes  °[facility], Mobile Crisis Care Unit  1-877-626-1772   °Assertive °[facility] ° [address]  [place], [place] 336-834-9664   °Sharon DeEsch [address] °[place] 336-554-5454   ° °Self-Help/Support Groups °Organization         Address  Phone             Notes  °[facility]  - variety of support groups  336- 373-1402 Call for more information  °Narcotics Anonymous (NA), [facility] [address], °[place]  2 meetings at this location  ° °  Residential Treatment Programs Organization         Address  Phone  Notes  [facility] [address],    [place]  6-387-564-3329   [facility]  [address], [place] [address], [place], [place] 660-630-1601   [facility] [address] [place], [place] 093-235-5732 Admissions: 8am-3pm M-F  [facility] [address],    [place], [place] 202-542-7062   [facility] [address] [place], [place], [place] 376-283-1517   [facility] [address],  [place], [place] 616-073-7106   [facility] Intensive Outpatient [address], [place], [place] 269-485-4627   [facility] ([facility] [address] [place]  [place], [place] 0-350-093-8182 or 216-363-4390   [facility] ([facility] [address] [place], [place] 938-101-7510 Accepts Medicaid  [facility] [address]  [place] 2-585-277-8242 Substance Abuse/Addiction Treatment   [facility]         Address  Phone  Notes  [facility]  770-383-0699   Angie Fava, PhD [address] Knack [place], [place]   401 542 7503 or 3346549207   [facility]   [address] [place], [place] 731-859-3180   [facility] [address] [place], [place] (628)193-6594 Insurance/Medicaid/sponsorship through [facility] and Families [address]                                    [place], [place] 518-237-7057 Therapy/tele-psych/case    [facility] [address], [place] 229-029-2660    Dr. Lolly Mustache  216-697-6478   [facility]  [employer] [facility]. 1) [address], [place] 2) [address] [place] 3)  [address], [place] 281-113-9269 (586)[phone]  (424) 442-4341   [facility] Child Abuse Hotline (724)623-0069 or 409 837 2779 (After Hours)      Take the prescriptions as directed.  To to [facility] today as instructed by the [facility] Staff. Call your regular medical doctor today to schedule a follow up appointment within the next week.  Return to the Emergency Department immediately sooner if worsening.

## 2014-12-07 NOTE — ED Notes (Signed)
MD at bedside.EDP 

## 2014-12-07 NOTE — ED Provider Notes (Signed)
Psych team has re-evaluated pt today: pt is at his baseline per Psych team and pt's mother, pt's mother would like to take him home now, Psych team requests EDP to rx 30 days of his meds because [facility] apparently discontinued these during his last visit to them, which caused pt's decompensation; Psych team has spoken with pt's mother: she will go go [place] now and if any medication issues arise she will call [facility] staff immediately.  30d rx written. Pt's mother would like to take him home now. Will d/c stable.   Samuel Jester, DO 12/07/14 1512

## 2014-12-07 NOTE — ED Notes (Signed)
Patient awakes by calling name multiple times. Patient stares and took medications without incident.

## 2014-12-07 NOTE — ED Notes (Signed)
Pt up out of room walking with sitter

## 2014-12-07 NOTE — Progress Notes (Addendum)
Patient on the [facility] wait-list, per RN Erskine Squibb and on [facility] waitlist, per Misty Stanley both on 6/24. [facility] - patient's IVC papers and pt's clinicals (urinalysis, urine drug screen, weight 140) were faxed to [facility] at [facility].  At capacity:  [facility]   CSW will continue to seek placement.  Melbourne Abts, LCSWA Disposition staff 12/07/2014 5:52 PM

## 2014-12-07 NOTE — Progress Notes (Signed)
Per [facility] admissions, Pt is not on the waitlist. CSW refaxed referral for review.  Chad Cordial, Theresia Majors [phone]

## 2014-12-07 NOTE — ED Provider Notes (Addendum)
1540:  Called to bedside: Pt was laying down sleeping waiting for his mom to come pick him up. Pt was awake/alert, conversive with ED staff.  Pt is awake, staring off, and will intermittently nod head yes/no to questions. Pt attempting several times to get out of bed, difficult to re-direct. Family is at bedside, states "That's not how he was acting earlier" and "that's the way he was when we brought him here" several days ago. Discharge cancelled. Will have TTS re-consult due to change in status.   1615:  T/C back from [place], case discussed: agrees this is a change in pt status from earlier, states to keep pt in ED overnight and he will re-eval pt tomorrow morning. ED RN updated with plan of care.    Samuel Jester, DO 12/07/14 [phone]

## 2014-12-07 NOTE — Progress Notes (Signed)
Pt referral faxed to the following facilities who report they are accepting referrals or have bed availability:  Paulino Door  CSW resent [facility] referral and it is currently under review.  Will continue seeking placement.  Chad Cordial, LCSWA 12/07/2014 1:14 PM

## 2014-12-07 NOTE — ED Notes (Signed)
Called [facility] and requested a follow up TTS per mother request stating pt is back at his baseline since getting back on his meds and mom wants pt to come back home.

## 2014-12-07 NOTE — ED Notes (Signed)
Pt amb to the bathroom with sitter and now ambulating around pod c with [employer] officer and sitter.

## 2014-12-07 NOTE — ED Notes (Signed)
Called and spoke with pharmacy regarding sending Neurontin to the ED.

## 2014-12-07 NOTE — ED Notes (Signed)
Requested meds from pharmacy  

## 2014-12-07 NOTE — ED Notes (Signed)
Repositioned patient and applied blanket on patient.

## 2014-12-07 NOTE — Consult Note (Signed)
[facility] Telepsychiatry Consult   Reason for Consult:  Schizoaffective, non compliance with medication  Referring Physician:  EDP Patient Identification: Cody [name] MRN:  [id number] Principal Diagnosis: Schizoaffective disorder, bipolar type Diagnosis:   Patient Active Problem List   Diagnosis Date Noted  . Schizoaffective disorder, bipolar type [F25.0]     Priority: High    Total Time spent with patient: 25 minutes  Subjective:   Cody [name] is a 24 y.o. male patient admitted with psychosis. Pt familiar to this NP from a consult a few days ago. Pt was psychotic and not able to speak at the time. Pt's mother reported that [facility] discontinued his Risperdal and he decompensated.  Pt seen and chart reviewed after ED staff report great improvement in pt status. Pt is now alert/oriented x4, calm, cooperative, and appropriate to situation and responses. Pt denies suicidal/homicidal ideation and psychosis and does not appear to be responding to internal stimuli. This NP spoke to pt's mother and she reports "my baby is back, oh he's doing so much better, this is how he normally is". EDP and nursing staff agree that pt is stable as well and this provider concurs with that opinion.  Mother in agreement to take AVS printout of medications to [facility], EDP in agreement to write 30-day scripts per Dr. Remus Blake request, and mother will call [facility] if [facility] discontinues the Risperdal/Lithium so that we can assist in finding a provider that will continue care necessary to keep this pt stable.    HPI:  Cody [name] is a 24 y.o. Male seen face to face for psychiatric consultation and evaluation of schizoaffective disorder, non compliant with medication and presented with increased symptoms of bizarre behaviors, non communicative and increased blinking of his eyes and appeared anxious and scared. Reportedly he received Geodon IM for eloping from [facility] which has no impact on his mental status. Patient has restarted  previous medication from [facility] in May 2016, and started slowly responding with single words and asking for coke and saying Yes or No etc. Patient does not have EPS or muscular rigidity. He is able to walk on hall way and used rest room during my visit. Spoke with patient staff RN and LCSW who has been in contact with his mother. Review of medical records indicated he has at least five encounter to the hospital system. Patient has poor insight and judgment. He has poor participation in therapeutic activities in hospitalization. He has history of paranoid delusions and emotionally unstable with crying spells. He has substance abuse especially smoking THC. Patient UDS is positive for cannabis and his BAL is <5. Patient was never graduating from school due to mental illness, unable to hold job more than two months and reportedly father two children, age 4 years and another 6 months.   Past Medical History: No past medical history on file. No past surgical history on file. Family History: No family history on file. Social History:  History  Alcohol Use: Not on file     History  Drug Use Not on file    History   Social History  . Marital Status: Single    Spouse Name: N/A  . Number of Children: N/A  . Years of Education: N/A   Social History Main Topics  . Smoking status: Not on file  . Smokeless tobacco: Not on file  . Alcohol Use: Not on file  . Drug Use: Not on file  . Sexual Activity: Not on file   Other Topics Concern  .  Not on file   Social History Narrative  . No narrative on file   Additional Social History:    History of alcohol / drug use?:  (Unable to assess.  Patient refused to speak.)                     Allergies:  No Known Allergies  Labs:  No results found for this or any previous visit (from the past 48 hour(s)).  Vitals: Blood pressure 115/70, pulse 110, temperature 98.6 F (37 C), temperature source Oral, resp. rate 18, SpO2 99 %.  Risk to Self:  Suicidal Ideation:  (Unable to assess.  Patient refused to speak) Suicidal Intent:  (Unable to assess.  Patient refused to speak) Is patient at risk for suicide?:  (Unable to assess.  Patient refused to speak) Suicidal Plan?:  (Unable to assess.  Patient refused to speak) Access to Means:  (Unable to assess.  Patient refused to speak) What has been your use of drugs/alcohol within the last 12 months?:  (Unable to assess.  Patient refused to speak) How many times?:  (Unable to assess.  Patient refused to speak) Other Self Harm Risks:  (Unable to assess.  Patient refused to speak) Triggers for Past Attempts:  (Unable to assess.  Patient refused to speak) Intentional Self Injurious Behavior:  (Unable to assess.  Patient refused to speak) Risk to Others: Homicidal Ideation:  (Unable to assess.  Patient refused to speak) Thoughts of Harm to Others:  (Unable to assess.  Patient refused to speak) Current Homicidal Intent:  (Unable to assess.  Patient refused to speak) Current Homicidal Plan:  (Unable to assess.  Patient refused to speak) Access to Homicidal Means:  (Unable to assess.  Patient refused to speak) Identified Victim: Unable to assess.  Patient refused to speak History of harm to others?:  (Unable to assess.  Patient refused to speak) Assessment of Violence:  (Unable to assess.  Patient refused to speak) Violent Behavior Description: Unable to assess.  Patient refused to speak Does patient have access to weapons?:  (Unable to assess.  Patient refused to speak) Criminal Charges Pending?:  (Unable to assess.  Patient refused to speak) Does patient have a court date:  (Unable to assess.  Patient refused to speak) Prior Inpatient Therapy: Prior Inpatient Therapy:  (Unable to assess.  Patient refused to speak) Prior Outpatient Therapy: Prior Outpatient Therapy:  (Unable to assess.  Patient refused to speak) Does patient have an ACCT team?:  (Unable to assess.  Patient refused to speak) Does  patient have Intensive In-House Services?  :  (Unable to assess.  Patient refused to speak) Does patient have [facility] services? :  (Unable to assess.  Patient refused to speak) Does patient have [facility]4CC services?:  (Unable to assess.  Patient refused to speak)  Current Facility-Administered Medications  Medication Dose Route Frequency Provider Last Rate Last Dose  . alum & mag hydroxide-simeth (MAALOX/MYLANTA) 200-200-20 MG/5ML suspension 30 mL  30 mL Oral PRN [name], MD      . benztropine (COGENTIN) tablet 0.5 mg  0.5 mg Oral BID Tilden Fossa, MD   0.5 mg at 12/07/14 1106  . gabapentin (NEURONTIN) capsule 200 mg  200 mg Oral TID Tilden Fossa, MD   200 mg at 12/07/14 1333  . hydrOXYzine (VISTARIL) capsule 50 mg  50 mg Oral TID PRN Tilden Fossa, MD      . ibuprofen (ADVIL,MOTRIN) tablet 600 mg  600 mg Oral Q8H PRN [name],  MD      . lithium carbonate capsule 300 mg  300 mg Oral BID WC Tilden Fossa, MD   300 mg at 12/07/14 0815  . LORazepam (ATIVAN) tablet 1 mg  1 mg Oral Q8H PRN [name], MD   1 mg at 12/07/14 0815  . nicotine (NICODERM CQ - dosed in mg/24 hours) patch 21 mg  21 mg Transdermal Daily [name], MD   21 mg at 12/07/14 1105  . ondansetron (ZOFRAN) tablet 4 mg  4 mg Oral Q8H PRN [name], MD      . risperiDONE (RISPERDAL) tablet 1 mg  1 mg Oral BID Leata Mouse, MD   1 mg at 12/07/14 1105   Current Outpatient Prescriptions  Medication Sig Dispense Refill  . benztropine (COGENTIN) 0.5 MG tablet Take 0.5 mg by mouth 2 (two) times daily.     Marland Kitchen gabapentin (NEURONTIN) 100 MG capsule Take 200 mg by mouth 3 (three) times daily.    . hydrOXYzine (VISTARIL) 50 MG capsule Take 50 mg by mouth 3 (three) times daily as needed for anxiety ((sleep)).     Marland Kitchen lithium carbonate 300 MG capsule Take 300 mg by mouth 2 (two) times daily with a meal.    . nicotine (NICODERM CQ - DOSED IN MG/24 HOURS) 21 mg/24hr patch Place 21 mg onto the skin daily.    . risperiDONE  (RISPERDAL) 1 MG tablet Take 1 mg by mouth daily.      Musculoskeletal: UTO, camera  Psychiatric Specialty Exam: Physical Exam Full physical performed in Emergency Department. I have reviewed this assessment and concur with its findings.   Review of Systems  Psychiatric/Behavioral: Positive for substance abuse (THC). Negative for depression, suicidal ideas and hallucinations. The patient is not nervous/anxious and does not have insomnia.   All other systems reviewed and are negative.  not completed due to poor verbal response and insight and judgment  Blood pressure 115/70, pulse 110, temperature 98.6 F (37 C), temperature source Oral, resp. rate 18, SpO2 99 %.There is no height or weight on file to calculate BMI.  General Appearance: Casual and Fairly Groomed  Patent attorney::  Good  Speech:  Clear and Coherent and Slow  Volume:  Decreased  Mood:  Euthymic  Affect:  Appropriate and Congruent  Thought Process:  Coherent and Goal Directed  Orientation:  Full (Time, Place, and Person)  Thought Content:  WDL  Suicidal Thoughts:  No  Homicidal Thoughts:  No  Memory:  Immediate;   Fair Recent;   Fair Remote;   Fair  Judgement:  Fair  Insight:  Fair  Psychomotor Activity:  Normal  Concentration:  Good  Recall:  [employer] of Knowledge:Fair  Language: Fair  Akathisia:  Negative  Handed:  Right  AIMS (if indicated):     Assets:  Desire for Improvement Housing Intimacy Leisure Time Physical Health Resilience Social Support  ADL's:  Intact  Cognition: WNL  Sleep:      Medical Decision Making: Established Problem, Stable/Improving (1), Review of Psycho-Social Stressors (1), Review or order clinical lab tests (1), Review or order medicine tests (1), Review of Medication Regimen & Side Effects (2) and Review of New Medication or Change in Dosage (2)  Treatment Plan Summary:  Schizoaffective disorder, bipolar type, stable for discharge at this time with outpatient  referrals.  *Reviewed labs: Lithium was 0.11 on 12/04/14  Disposition: -Discharge home -Call Mother when pt ready to leave -Have pt follow-up at [facility] or similar sponsored  clinic -EDP, please give scripts for 30 days for Lithium, Risperdal, Cogentin, and Neurontin to assist with stability and minimize recidivism (discussed with EDP)   Beau Fanny, FNP-BC 12/07/2014 2:21 PM

## 2014-12-07 NOTE — ED Notes (Signed)
TTS at bedside. 

## 2014-12-08 MED ORDER — BENZTROPINE MESYLATE 1 MG PO TABS
1.0000 mg | ORAL_TABLET | Freq: Two times a day (BID) | ORAL | Status: DC
  Administered 2014-12-08 – 2014-12-10 (×4): 1 mg via ORAL
  Filled 2014-12-08 (×4): qty 1

## 2014-12-08 MED ORDER — RISPERIDONE 0.5 MG PO TABS
1.5000 mg | ORAL_TABLET | Freq: Two times a day (BID) | ORAL | Status: DC
  Administered 2014-12-08 – 2014-12-10 (×4): 1.5 mg via ORAL
  Filled 2014-12-08 (×4): qty 3

## 2014-12-08 NOTE — Consult Note (Signed)
[facility] Telepsychiatry Consult   Reason for Consult:  Schizoaffective, non compliance with medication  Referring Physician:  EDP Patient Identification: Cody [name] MRN:  [id number] Principal Diagnosis: Schizoaffective disorder, bipolar type Diagnosis:   Patient Active Problem List   Diagnosis Date Noted  . Schizoaffective disorder, bipolar type [F25.0]     Priority: High    Total Time spent with patient: 25 minutes  Subjective:   Cody [name] is a 24 y.o. male patient admitted with psychosis. Pt familiar to this NP from a consult a few days ago. Pt was psychotic and not able to speak at the time. Pt's mother reported that [facility] discontinued his Risperdal and he decompensated.  Update: Pt seen again due to destabilization 2 hours after consult yesterday when this NP, EDP, nursing staff, and family all agreed that pt was stable and had improved. Apparently, when family was taking awhile to show up, pt became very upset demanding to leave and became very agitated to the point that security had to escort him back to his room and Ativan was given. Pt slept yet awoke at 5am talking about running on a race track. Then pt attempted to run out of the ED again and was convinced to stay by a nurse; given Ativan again.  Today, pt presents as calm, cooperative, alert/oriented x4, and appropriate as he did yesterday when deemed stable. However, in detailed discussion with pt and father, the risk of pt going home and being exposed to another stressor and then acting out, could put pt and others in danger due to his destabilization and lack of impulse-control. The concern is that he responded very excessively and negatively to what a reasonable person would consider a minor stressor: waiting for someone to pick him up. If he responds this way to such a minor stressor, what minor, moderate, or major stressors would he encounter that could cause destabilization at home if he left at this time? This risk leads [place]  to seek inpatient admission for medication titration to ensure stability, at least in the presence of minor stressors.   Due to continued destabilization in response to the minor acute stressor of having to wait for family to show up, pt warrants inpatient admission for possible medication titration. All providers and pt and his father in agreement.     HPI:  Cody [name] is a 24 y.o. Male seen face to face for psychiatric consultation and evaluation of schizoaffective disorder, non compliant with medication and presented with increased symptoms of bizarre behaviors, non communicative and increased blinking of his eyes and appeared anxious and scared. Reportedly he received Geodon IM for eloping from [facility] which has no impact on his mental status. Patient has restarted previous medication from [facility] in May 2016, and started slowly responding with single words and asking for coke and saying Yes or No etc. Patient does not have EPS or muscular rigidity. He is able to walk on hall way and used rest room during my visit. Spoke with patient staff RN and LCSW who has been in contact with his mother. Review of medical records indicated he has at least five encounter to the hospital system. Patient has poor insight and judgment. He has poor participation in therapeutic activities in hospitalization. He has history of paranoid delusions and emotionally unstable with crying spells. He has substance abuse especially smoking THC. Patient UDS is positive for cannabis and his BAL is <5. Patient was never graduating from school due to mental illness, unable to hold job  more than two months and reportedly father two children, age 66 years and another 6 months.   06/24/216: Pt deemed stabile by all providers, nursing staff, and family present. Pt decompensated in response to very minor stressors, requiring security involvement and 2 medication interventions to calm him down.  Past Medical History: No past medical history on  file. No past surgical history on file. Family History: No family history on file. Social History:  History  Alcohol Use: Not on file     History  Drug Use Not on file    History   Social History  . Marital Status: Single    Spouse Name: N/A  . Number of Children: N/A  . Years of Education: N/A   Social History Main Topics  . Smoking status: Not on file  . Smokeless tobacco: Not on file  . Alcohol Use: Not on file  . Drug Use: Not on file  . Sexual Activity: Not on file   Other Topics Concern  . Not on file   Social History Narrative  . No narrative on file   Additional Social History:    History of alcohol / drug use?:  (Unable to assess.  Patient refused to speak.)                     Allergies:  No Known Allergies  Labs:  No results found for this or any previous visit (from the past 48 hour(s)).  Vitals: Blood pressure 122/82, pulse 111, temperature 98.9 F (37.2 C), temperature source Oral, resp. rate 20, weight 63.504 kg (140 lb), SpO2 98 %.  Risk to Self: Suicidal Ideation:  (Unable to assess.  Patient refused to speak) Suicidal Intent:  (Unable to assess.  Patient refused to speak) Is patient at risk for suicide?:  (Unable to assess.  Patient refused to speak) Suicidal Plan?:  (Unable to assess.  Patient refused to speak) Access to Means:  (Unable to assess.  Patient refused to speak) What has been your use of drugs/alcohol within the last 12 months?:  (Unable to assess.  Patient refused to speak) How many times?:  (Unable to assess.  Patient refused to speak) Other Self Harm Risks:  (Unable to assess.  Patient refused to speak) Triggers for Past Attempts:  (Unable to assess.  Patient refused to speak) Intentional Self Injurious Behavior:  (Unable to assess.  Patient refused to speak) Risk to Others: Homicidal Ideation:  (Unable to assess.  Patient refused to speak) Thoughts of Harm to Others:  (Unable to assess.  Patient refused to  speak) Current Homicidal Intent:  (Unable to assess.  Patient refused to speak) Current Homicidal Plan:  (Unable to assess.  Patient refused to speak) Access to Homicidal Means:  (Unable to assess.  Patient refused to speak) Identified Victim: Unable to assess.  Patient refused to speak History of harm to others?:  (Unable to assess.  Patient refused to speak) Assessment of Violence:  (Unable to assess.  Patient refused to speak) Violent Behavior Description: Unable to assess.  Patient refused to speak Does patient have access to weapons?:  (Unable to assess.  Patient refused to speak) Criminal Charges Pending?:  (Unable to assess.  Patient refused to speak) Does patient have a court date:  (Unable to assess.  Patient refused to speak) Prior Inpatient Therapy: Prior Inpatient Therapy:  (Unable to assess.  Patient refused to speak) Prior Outpatient Therapy: Prior Outpatient Therapy:  (Unable to assess.  Patient refused to speak) Does  patient have an ACCT team?:  (Unable to assess.  Patient refused to speak) Does patient have Intensive In-House Services?  :  (Unable to assess.  Patient refused to speak) Does patient have [facility] services? :  (Unable to assess.  Patient refused to speak) Does patient have P4CC services?:  (Unable to assess.  Patient refused to speak)  Current Facility-Administered Medications  Medication Dose Route Frequency Provider Last Rate Last Dose  . alum & mag hydroxide-simeth (MAALOX/MYLANTA) 200-200-20 MG/5ML suspension 30 mL  30 mL Oral PRN [name], MD      . benztropine (COGENTIN) tablet 0.5 mg  0.5 mg Oral BID Tilden Fossa, MD   0.5 mg at 12/08/14 0902  . gabapentin (NEURONTIN) capsule 200 mg  200 mg Oral TID Tilden Fossa, MD   200 mg at 12/07/14 2145  . hydrOXYzine (VISTARIL) capsule 50 mg  50 mg Oral TID PRN Tilden Fossa, MD      . ibuprofen (ADVIL,MOTRIN) tablet 600 mg  600 mg Oral Q8H PRN [name], MD      . lithium carbonate capsule 300 mg  300  mg Oral BID WC Tilden Fossa, MD   300 mg at 12/08/14 0900  . LORazepam (ATIVAN) tablet 1 mg  1 mg Oral Q8H PRN [name], MD   1 mg at 12/08/14 0901  . nicotine (NICODERM CQ - dosed in mg/24 hours) patch 21 mg  21 mg Transdermal Daily [name], MD   21 mg at 12/08/14 0903  . ondansetron (ZOFRAN) tablet 4 mg  4 mg Oral Q8H PRN [name], MD      . risperiDONE (RISPERDAL) tablet 1 mg  1 mg Oral BID Leata Mouse, MD   1 mg at 12/08/14 0901   Current Outpatient Prescriptions  Medication Sig Dispense Refill  . benztropine (COGENTIN) 0.5 MG tablet Take 0.5 mg by mouth 2 (two) times daily.     . benztropine (COGENTIN) 0.5 MG tablet Take 1 tablet (0.5 mg total) by mouth 2 (two) times daily. 60 tablet 0  . gabapentin (NEURONTIN) 100 MG capsule Take 200 mg by mouth 3 (three) times daily.    Marland Kitchen gabapentin (NEURONTIN) 100 MG capsule Take 2 capsules (200 mg total) by mouth 3 (three) times daily. 180 capsule 0  . hydrOXYzine (VISTARIL) 50 MG capsule Take 50 mg by mouth 3 (three) times daily as needed for anxiety ((sleep)).     Marland Kitchen lithium carbonate 300 MG capsule Take 300 mg by mouth 2 (two) times daily with a meal.    . lithium carbonate 300 MG capsule Take 1 capsule (300 mg total) by mouth 2 (two) times daily with a meal. 60 capsule 0  . nicotine (NICODERM CQ - DOSED IN MG/24 HOURS) 21 mg/24hr patch Place 21 mg onto the skin daily.    . risperiDONE (RISPERDAL) 1 MG tablet Take 1 mg by mouth daily.    . risperiDONE (RISPERDAL) 1 MG tablet Take 1 tablet (1 mg total) by mouth 2 (two) times daily. 60 tablet 0    Musculoskeletal: UTO, camera  Psychiatric Specialty Exam: Physical Exam Full physical performed in Emergency Department. I have reviewed this assessment and concur with its findings.   Review of Systems  Psychiatric/Behavioral: Positive for substance abuse (THC). Negative for depression, suicidal ideas and hallucinations. The patient is not nervous/anxious and does not have  insomnia.   All other systems reviewed and are negative.  not completed due to poor verbal response and insight and judgment  Blood  pressure 122/82, pulse 111, temperature 98.9 F (37.2 C), temperature source Oral, resp. rate 20, weight 63.504 kg (140 lb), SpO2 98 %.There is no height on file to calculate BMI.  General Appearance: Casual and Fairly Groomed  Patent attorney::  Good  Speech:  Clear and Coherent and Slow  Volume:  Decreased  Mood:  Euthymic  Affect:  Appropriate and Congruent  Thought Process:  Labile and unpredictable  Orientation:  Full (Time, Place, and Person)  Thought Content:  WDL  Suicidal Thoughts:  No  Homicidal Thoughts:  No  Memory:  Immediate;   Fair Recent;   Fair Remote;   Fair  Judgement:  Fair  Insight:  Fair  Psychomotor Activity:  Normal  Concentration:  Good  Recall:  [employer] of Knowledge:Fair  Language: Fair  Akathisia:  Negative  Handed:  Right  AIMS (if indicated):     Assets:  Desire for Improvement Housing Intimacy Leisure Time Physical Health Resilience Social Support  ADL's:  Intact  Cognition: WNL  Sleep:      Medical Decision Making: Review of Psycho-Social Stressors (1), Review or order clinical lab tests (1), Established Problem, Worsening (2), Review or order medicine tests (1), Review of Medication Regimen & Side Effects (2) and Review of New Medication or Change in Dosage (2)  Treatment Plan Summary:  Schizoaffective disorder, bipolar type, destabilized, warrants inpatient admission  *Reviewed labs: Lithium was 0.11 on 12/04/14  Disposition: -Admit to inpatient psychiatric hospitalization for safety and stabilization.  -Increase Risperdal to 1.5bid; increase Cogentin to 1mg  bid.  *Case reviewed with Dr. Cleda Daub, Everardo All, FNP-BC 12/08/2014 10:47 AM  Case reviewed and agree with plan.

## 2014-12-08 NOTE — ED Notes (Signed)
Pt ambulated to the bathroom and walked around the unit with the sitter and security.

## 2014-12-08 NOTE — ED Notes (Signed)
Pt still in deep sleep and unable to give meds at this time.

## 2014-12-08 NOTE — ED Notes (Signed)
Asked MD to check on Pt HR of 122.

## 2014-12-08 NOTE — ED Notes (Addendum)
Pt mom and dad left from visitation.  Pt sitting by bed drawing with sitter.

## 2014-12-08 NOTE — ED Notes (Signed)
Pt walking around the unit, minimally verbal, pt requesting breakfast.  RN advised that breakfast is on its way.

## 2014-12-08 NOTE — ED Notes (Signed)
Food that had been faxed @ 6:30 finally arrived @ 8:37. Called @8:20 for the second time to ask where the food was at and the lady on the line said the food was on the elevator up to our location @ 8:17.   

## 2014-12-08 NOTE — ED Notes (Signed)
Patient asked to walk the race track to burn up some energy.

## 2014-12-08 NOTE — ED Notes (Signed)
Step father at bedside and TTS talking with Pt and father at this time.

## 2014-12-08 NOTE — ED Notes (Signed)
Pt became agitated after finishing his snacks. Pt tried to exit the room and required redirection. Pt was placed back into bed and settled down afterwards.

## 2014-12-08 NOTE — ED Notes (Signed)
Relieved sitter for lunch break. 

## 2014-12-08 NOTE — ED Notes (Signed)
Patient given snacks and fluids.

## 2014-12-08 NOTE — ED Notes (Signed)
Pt in deep sleep, unable to give meds, will give as soon as patient wakes up.

## 2014-12-08 NOTE — ED Notes (Signed)
Pt tried to run down the hallway and was able to walk back to his room with encouragement from RN.

## 2014-12-09 MED ORDER — DIPHENHYDRAMINE HCL 25 MG PO CAPS
25.0000 mg | ORAL_CAPSULE | Freq: Once | ORAL | Status: AC
  Administered 2014-12-09: 25 mg via ORAL
  Filled 2014-12-09: qty 1

## 2014-12-09 MED ORDER — LORAZEPAM 1 MG PO TABS
2.0000 mg | ORAL_TABLET | Freq: Once | ORAL | Status: AC
  Administered 2014-12-09: 1 mg via ORAL
  Filled 2014-12-09: qty 2

## 2014-12-09 MED ORDER — HALOPERIDOL 5 MG PO TABS
5.0000 mg | ORAL_TABLET | Freq: Once | ORAL | Status: AC
  Administered 2014-12-09: 5 mg via ORAL
  Filled 2014-12-09: qty 1

## 2014-12-09 NOTE — ED Notes (Signed)
Pt ambulatory to desk, requested to call his mother. Pt and his mother had pleasant conversation and pt walked back to his room.

## 2014-12-09 NOTE — ED Notes (Addendum)
Pt attempt to leave department while walking with sitter pt redirected and returned to room. Sitter requested not to walk patient at this time. [employer] and security at bedside. Pt attempted to leave room. Pt redirected again and returned to room. ED MD made aware and additional orders received. [employer] and Security remain in department at this time. Counselor's updated patient's mother on POC

## 2014-12-09 NOTE — ED Provider Notes (Signed)
Patient is still awaiting placement today. He attempted to run and required by mouth Haldol, Ativan and Benadryl. That has effectively made him more calm and cooperative.  Gwyneth Sprout, MD 12/09/14 1455

## 2014-12-09 NOTE — ED Notes (Signed)
Patient requesting to go home.  Explained that we are in the process of finding an appropriate bed for him so he can get help.  Voiced understanding.

## 2014-12-10 ENCOUNTER — Inpatient Hospital Stay (HOSPITAL_COMMUNITY)
Admission: AD | Admit: 2014-12-10 | Discharge: 2014-12-19 | DRG: 885 | Disposition: A | Discharge status: Home / Self Care | Payer: No Typology Code available for payment source | Source: Intra-hospital | Attending: Psychiatry | Admitting: Psychiatry

## 2014-12-10 ENCOUNTER — Encounter (HOSPITAL_COMMUNITY): Payer: Self-pay | Admitting: *Deleted

## 2014-12-10 DIAGNOSIS — F250 Schizoaffective disorder, bipolar type: Principal | ICD-10-CM | POA: Diagnosis present

## 2014-12-10 DIAGNOSIS — F411 Generalized anxiety disorder: Secondary | ICD-10-CM | POA: Diagnosis present

## 2014-12-10 DIAGNOSIS — G4700 Insomnia, unspecified: Secondary | ICD-10-CM | POA: Diagnosis present

## 2014-12-10 DIAGNOSIS — F1221 Cannabis dependence, in remission: Secondary | ICD-10-CM | POA: Diagnosis present

## 2014-12-10 DIAGNOSIS — F1220 Cannabis dependence, uncomplicated: Secondary | ICD-10-CM | POA: Diagnosis present

## 2014-12-10 DIAGNOSIS — F1290 Cannabis use, unspecified, uncomplicated: Secondary | ICD-10-CM | POA: Diagnosis present

## 2014-12-10 HISTORY — DX: Anxiety disorder, unspecified: F41.9

## 2014-12-10 MED ORDER — ALUM & MAG HYDROXIDE-SIMETH 200-200-20 MG/5ML PO SUSP: 30.0000 mL | ORAL | Status: DC | PRN

## 2014-12-10 MED ORDER — HYDROXYZINE HCL 25 MG PO TABS
25.0000 mg | ORAL_TABLET | Freq: Four times a day (QID) | ORAL | Status: DC | PRN
  Administered 2014-12-12 – 2014-12-17 (×5): 25 mg via ORAL
  Filled 2014-12-10 (×7): qty 1

## 2014-12-10 MED ORDER — TRAZODONE HCL 50 MG PO TABS
50.0000 mg | ORAL_TABLET | Freq: Every evening | ORAL | Status: DC | PRN
  Administered 2014-12-10 – 2014-12-16 (×6): 50 mg via ORAL
  Filled 2014-12-10 (×5): qty 1

## 2014-12-10 MED ORDER — BENZTROPINE MESYLATE 0.5 MG PO TABS
0.5000 mg | ORAL_TABLET | Freq: Two times a day (BID) | ORAL | Status: DC
  Administered 2014-12-10 – 2014-12-14 (×8): 0.5 mg via ORAL
  Filled 2014-12-10 (×13): qty 1

## 2014-12-10 MED ORDER — MAGNESIUM HYDROXIDE 400 MG/5ML PO SUSP: 30.0000 mL | Freq: Every day | ORAL | Status: DC | PRN

## 2014-12-10 MED ORDER — NICOTINE 21 MG/24HR TD PT24
21.0000 mg | MEDICATED_PATCH | Freq: Every day | TRANSDERMAL | Status: DC
  Administered 2014-12-11 – 2014-12-16 (×4): 21 mg via TRANSDERMAL
  Filled 2014-12-10 (×8): qty 1

## 2014-12-10 MED ORDER — ACETAMINOPHEN 325 MG PO TABS
650.0000 mg | ORAL_TABLET | Freq: Four times a day (QID) | ORAL | Status: DC | PRN
  Administered 2014-12-11 – 2014-12-15 (×4): 650 mg via ORAL
  Filled 2014-12-10 (×4): qty 2

## 2014-12-10 MED ORDER — RISPERIDONE 1 MG PO TABS
1.0000 mg | ORAL_TABLET | Freq: Two times a day (BID) | ORAL | Status: DC
  Administered 2014-12-10 – 2014-12-11 (×2): 1 mg via ORAL
  Filled 2014-12-10 (×8): qty 1

## 2014-12-10 NOTE — Progress Notes (Signed)
Per [facility] staff, Pt remains on wait list.  Chad CordialLauren Carter, LCSWA 12/10/2014 10:09 AM

## 2014-12-10 NOTE — ED Notes (Signed)
Pt standing at desk asking where breakfast is at. Pt made aware that he requested cereal and they had to go back down and get the cereal.

## 2014-12-10 NOTE — Progress Notes (Signed)
Patient ID: Cody [name], male   DOB: 06-28-1990, 24 y.o.   MRN: [id number] Pt is a [age] year male admitted involuntarily with altered mental status.  On admission pt states "I am lost in my head but I know I can find my way.  He reports he would "like help watching tv and help bringing him back to his family.  Pt reports that he lives with his stepfather. He reports that he smokes marijuana "every now and then."

## 2014-12-10 NOTE — ED Notes (Signed)
Pt given PB and graham crackers and apple juice while he waits for his breakfast to be delivered.

## 2014-12-10 NOTE — Progress Notes (Signed)
Pt's chart reviewed by RN. Assumed pt care at 1700pm post admission process. D: Pt's mood and affect is preoccupied and flat. Pt went down to the cafeteria for dinner meal soon after arrival. Pt appears to be thought blocking. Pt forwards little with RN. Pt minimally engages with others on the unit. A: NP Charisse MarchLaura D. made aware of pt's arrival to the unit. Orders acknowledged. Active listening by RN. Encouragement/Support provided to pt. Medication education reviewed with pt. Scheduled medications administered per providers orders (See MAR). 15 minute checks continued per protocol for patient safety.  R: Patient cooperative and receptive to nursing interventions. Pt remains safe.

## 2014-12-10 NOTE — Progress Notes (Signed)
Patient ID: Cody [name], male   DOB: 09-25-1990, 24 y.o.   MRN: [id number] D: Client in bed early eve of  the shift hidden under the covers, barley acknowledging staff, eventually gets up, but client is guarded, preoccupied, soft spoken, reports voices. A: Writer provided emotional support, encouraged to get a snack, reviewed medications, administered as prescribed. Client encouraged to verbalize any concerns. Staff will monitor q2915min for safety. R: Client is safe on the unit, did not attend group.

## 2014-12-10 NOTE — ED Notes (Signed)
NAD at this time. Pt is stable and leaving with [employer].

## 2014-12-10 NOTE — Progress Notes (Signed)
LCSW met with patient at the bedside along with patient father. Patient delayed in thoughts and speech, however much more coherent and able to answer questions approriately. Patient reports wanting to see his mom, wanting to leave his room as he is "having bad feelings" and appears to still be responding to internal stimuli.  Patient is walking, eating, and independent with all ADLs. Father updated on plan and agreeable. Mother at work. Patient still appears to not be therapeutic with medications, but signigicant improvement with patient taking all his medications, no flight risk today, and no prn medications. Remains anxious, guarded, and restless.    Still seeking placement for patient.    Lane Hacker, MSW Clinical Social Work: Emergency Room [phone]

## 2014-12-11 ENCOUNTER — Encounter (HOSPITAL_COMMUNITY): Payer: Self-pay | Admitting: Psychiatry

## 2014-12-11 DIAGNOSIS — F250 Schizoaffective disorder, bipolar type: Principal | ICD-10-CM

## 2014-12-11 DIAGNOSIS — F1290 Cannabis use, unspecified, uncomplicated: Secondary | ICD-10-CM

## 2014-12-11 DIAGNOSIS — F1220 Cannabis dependence, uncomplicated: Secondary | ICD-10-CM

## 2014-12-11 DIAGNOSIS — F1221 Cannabis dependence, in remission: Secondary | ICD-10-CM | POA: Diagnosis present

## 2014-12-11 HISTORY — DX: Cannabis use, unspecified, uncomplicated: F12.90

## 2014-12-11 MED ORDER — RISPERIDONE 2 MG PO TABS
2.0000 mg | ORAL_TABLET | Freq: Two times a day (BID) | ORAL | Status: DC
  Administered 2014-12-11 – 2014-12-13 (×4): 2 mg via ORAL
  Filled 2014-12-11 (×6): qty 1

## 2014-12-11 MED ORDER — OLANZAPINE 10 MG IM SOLR: 10.0000 mg | Freq: Three times a day (TID) | INTRAMUSCULAR | Status: DC | PRN

## 2014-12-11 MED ORDER — OLANZAPINE 10 MG PO TBDP
10.0000 mg | ORAL_TABLET | Freq: Three times a day (TID) | ORAL | Status: DC | PRN
  Administered 2014-12-13 – 2014-12-14 (×2): 10 mg via ORAL
  Filled 2014-12-11 (×4): qty 1

## 2014-12-11 MED ORDER — OLANZAPINE 10 MG PO TABS
ORAL_TABLET | ORAL | Status: AC
  Filled 2014-12-11: qty 1

## 2014-12-11 MED ORDER — DIVALPROEX SODIUM 500 MG PO DR TAB
500.0000 mg | DELAYED_RELEASE_TABLET | Freq: Two times a day (BID) | ORAL | Status: DC
  Administered 2014-12-11 – 2014-12-16 (×10): 500 mg via ORAL
  Filled 2014-12-11 (×14): qty 1

## 2014-12-11 MED ORDER — LORAZEPAM 0.5 MG PO TABS
0.5000 mg | ORAL_TABLET | Freq: Two times a day (BID) | ORAL | Status: DC
  Administered 2014-12-11 – 2014-12-13 (×4): 0.5 mg via ORAL
  Filled 2014-12-11 (×5): qty 1

## 2014-12-11 MED ORDER — OLANZAPINE 10 MG PO TBDP
10.0000 mg | ORAL_TABLET | Freq: Once | ORAL | Status: AC
  Administered 2014-12-11: 10 mg via ORAL

## 2014-12-11 NOTE — Progress Notes (Signed)
The focus of this group is to help patients review their daily goal of treatment and discuss progress on daily workbooks. Pt did not attend the evening group. 

## 2014-12-11 NOTE — H&P (Addendum)
Psychiatric Admission Assessment Adult  Patient Identification: Cody [name] MRN:  [id number] Date of Evaluation:  12/11/2014 Chief Complaint: Patient lays in bed - eyes opened when writer called his name , but refused to communicate , or respond to any questions asked.'       Principal Diagnosis: Schizoaffective disorder, bipolar type Diagnosis:   Patient Active Problem List   Diagnosis Date Noted  . Cannabis use disorder, severe, dependence [F12.20] 12/11/2014  . Schizoaffective disorder, bipolar type [F25.0]       History of Present Illness:: Patient is a 11 y old AAM , who has a hx of schizoaffective disorder , who presented to [facility] on 12/03/14 - brought in as Cody [name] [facility] - after bystanders called them after finding patient unresponsive. Patient was medically cleared in ED - had CT scan head done (unremarkable) , EEG done - no seizure like activity found, UDS - positive for THC.   Pt was observed to be non communicative in ED, also appeared to be aggressive requiring chemical as well as physical restraints. Pt was placed on [facility] list for higher level of care - however was transferred to [facility] for psychiatric care .  Pt today was observed by staff in the AM - in day room - per RN Brittney - the only communication this AM was " I want my Depakote ". Pt thereafter threw coffee on another patient and was seen as posturing with clenched fist at him . Pt was medicated with Zyprexa 10 mg x1 dose PO. Pt also placed on 1:1 precaution for safety reasons . Pt thereafter seen as laying on his bed - does not communicate at all- observed by the mental health tech as getting up from his bed to use the bathroom. Otherwise - does not interact or communicate.  Majority of the information hence has been obtained from EHR .Pt had an admission here at [facility] in may 2016. At that time - pt had similar presentation - minimally responsive the first day. However pt improved on medications started here and was  discharged on Risperidone as well as Lithium.  Patient at that time was asked to follow up with [facility].      Elements:  Location:  psychosis. Quality:  see above. Severity:  severe. Timing:  acute. Duration:  past 1 week. Context:  hx of schizoaffective do, noncompliance. Associated Signs/Symptoms: Depression Symptoms:  Unable to express, seen as withdrawn (Hypo) Manic Symptoms:  Unable to express Anxiety Symptoms:  Unable to express Psychotic Symptoms: pt responding to internal stimuli - paranoid , aggressive PTSD Symptoms: Unable to express Total Time spent with patient: 45 minutes  Past Medical History:  Past Medical History  Diagnosis Date  . Schizophrenia   . Anxiety    History reviewed. No pertinent past surgical history. Family History: pt non communicative  Social History:  History  Alcohol Use No     History  Drug Use  . Yes  . Special: Marijuana    History   Social History  . Marital Status: Single    Spouse Name: N/A  . Number of Children: N/A  . Years of Education: N/A   Social History Main Topics  . Smoking status: Smoker, Current Status Unknown  . Smokeless tobacco: Never Used  . Alcohol Use: No  . Drug Use: Yes    Special: Marijuana  . Sexual Activity: Yes   Other Topics Concern  . None   Social History Narrative   Additional Social History:  History of alcohol / drug use?: Yes (drank alcohol two months ago.)        Pt unable to express any more social hx.             Musculoskeletal: Strength & Muscle Tone: within normal limits Gait & Station: normal Patient leans: N/A  Psychiatric Specialty Exam: Physical Exam I concur with PE done in ED  Review of Systems  Unable to perform ROS: mental acuity    Blood pressure 114/70, pulse 102, temperature 98.9 F (37.2 C), temperature source Oral, resp. rate 18, height 5\' 7"  (1.702 m), weight 73.936 kg (163 lb).Body mass index is 25.52 kg/(m^2).    General Appearance:  Disheveled  Eye Contact:: Poor  Speech: non- communicative  Volume: pt refuses to speak  Mood: unable to express  Affect: Restricted  Thought Process: Disorganized and Irrelevant  Orientation: Other: unable to assess  Thought Content: appears to be responding to internal stimuli - pt is supsicious , paranoid, threw coffee on another patient this AM and had to be medicated  Suicidal Thoughts: unable to assess  Homicidal Thoughts: pt did not express any  Memory: Immediate; unable to assess Recent; unable to assess Remote; unable to assess  Judgement: Impaired  Insight: Lacking  Psychomotor Activity: Decreased  Concentration: poor  Recall: Poor  Fund of Knowledge:does not communicate  Language: Poor  Akathisia: No    AIMS (if indicated):    Assets: Others: access to health care  Sleep: Number of Hours: 5.25  Cognition: Impaired, Mild  ADL's: Impaired                          Risk to Self: Is patient at risk for suicide?: No Risk to Others:  yes - paranoid, aggressive Prior Inpatient Therapy:  yes at [facility] in the past - may 2016 Prior Outpatient Therapy:  [facility]  Alcohol Screening: 1. How often do you have a drink containing alcohol?: Monthly or less 2. How many drinks containing alcohol do you have on a typical day when you are drinking?: 1 or 2 3. How often do you have six or more drinks on one occasion?: Never Preliminary Score: 0 9. Have you or someone else been injured as a result of your drinking?: No 10. Has a relative or friend or a doctor or another health worker been concerned about your drinking or suggested you cut down?: No Alcohol Use Disorder Identification Test Final Score (AUDIT): 1 Brief Intervention: Patient declined brief intervention  Allergies:  No Known Allergies Lab Results: No results found for this or any previous visit (from the past 48 hour(s)). Current Medications: Current  Facility-Administered Medications  Medication Dose Route Frequency Provider Last Rate Last Dose  . acetaminophen (TYLENOL) tablet 650 mg  650 mg Oral Q6H PRN Thermon Leyland, NP      . alum & mag hydroxide-simeth (MAALOX/MYLANTA) 200-200-20 MG/5ML suspension 30 mL  30 mL Oral Q4H PRN Thermon Leyland, NP      . benztropine (COGENTIN) tablet 0.5 mg  0.5 mg Oral BID Thermon Leyland, NP   0.5 mg at 12/11/14 0751  . divalproex (DEPAKOTE) DR tablet 500 mg  500 mg Oral Q12H Man Effertz, MD      . hydrOXYzine (ATARAX/VISTARIL) tablet 25 mg  25 mg Oral Q6H PRN Thermon Leyland, NP      . LORazepam (ATIVAN) tablet 0.5 mg  0.5 mg Oral BID Jomarie Longs, MD      .  magnesium hydroxide (MILK OF MAGNESIA) suspension 30 mL  30 mL Oral Daily PRN Thermon Leyland, NP      . nicotine (NICODERM CQ - dosed in mg/24 hours) patch 21 mg  21 mg Transdermal Daily Jomarie Longs, MD   21 mg at 12/11/14 0750  . risperiDONE (RISPERDAL) tablet 2 mg  2 mg Oral BID Jomarie Longs, MD      . traZODone (DESYREL) tablet 50 mg  50 mg Oral QHS PRN Thermon Leyland, NP   50 mg at 12/10/14 2152   PTA Medications: Prescriptions prior to admission  Medication Sig Dispense Refill Last Dose  . benztropine (COGENTIN) 0.5 MG tablet Take 0.5 mg by mouth 2 (two) times daily.    Unknown at Unknown time  . benztropine (COGENTIN) 0.5 MG tablet Take 1 tablet (0.5 mg total) by mouth 2 (two) times daily. 60 tablet 0 Unknown at Unknown time  . gabapentin (NEURONTIN) 100 MG capsule Take 200 mg by mouth 3 (three) times daily.   Unknown at Unknown time  . gabapentin (NEURONTIN) 100 MG capsule Take 2 capsules (200 mg total) by mouth 3 (three) times daily. 180 capsule 0 Unknown at Unknown time  . hydrOXYzine (VISTARIL) 50 MG capsule Take 50 mg by mouth 3 (three) times daily as needed for anxiety ((sleep)).    Unknown at Unknown time  . lithium carbonate 300 MG capsule Take 300 mg by mouth 2 (two) times daily with a meal.   Unknown at Unknown time  . lithium  carbonate 300 MG capsule Take 1 capsule (300 mg total) by mouth 2 (two) times daily with a meal. 60 capsule 0 Unknown at Unknown time  . nicotine (NICODERM CQ - DOSED IN MG/24 HOURS) 21 mg/24hr patch Place 21 mg onto the skin daily.   Unknown at Unknown time  . risperiDONE (RISPERDAL) 0.5 MG tablet TK 1 T PO  QHS  0   . risperiDONE (RISPERDAL) 1 MG tablet Take 1 mg by mouth daily.   over a month  . risperiDONE (RISPERDAL) 1 MG tablet Take 1 tablet (1 mg total) by mouth 2 (two) times daily. 60 tablet 0 Unknown at Unknown time    Previous Psychotropic Medications: Yes Lithium ,Depakote ,Risperidone  Substance Abuse History in the last 12 months:  Yes.   cannabis    Consequences of Substance Abuse: Medical Consequences:  recent admission  No results found for this or any previous visit (from the past 72 hour(s)).  Observation Level/Precautions:  1:1 precaution  Laboratory:  reviewed labs in EHR - CT scan head - wnl,UDS- Positive for THC. Bal<5  Psychotherapy:  Individual and group therapy   Medications:  As below  Consultations:  Social worker  Discharge Concerns: Stability [place]        Psychological Evaluations: No   Treatment Plan Summary: Daily contact with patient to assess and evaluate symptoms and progress in treatment and Medication management   Patient will benefit from inpatient treatment and stabilization.  Estimated length of stay is 5-7 days.  Reviewed past medical records,treatment plan.  Will continue Risperidone , change to 2 mg po bid for psychosis. Will continue Cogentin 0.5 mg po bid for EPS. Will add Depakote DR 500 mg po bid for mood lability. Will order LFTS . Reviewed CBC,BMP. Will add Ativan 0.5 mg po bid for anxiety sx. Will make available Zyprexa 10 mg po/im q8h prn for severe anxiety/agitation. Pt to be observed on 1:1 due to safety reasons. Will attempt to  obtain collateral from family. Will continue to monitor vitals ,medication compliance and  treatment side effects while patient is here.  Will monitor for medical issues as well as call consult as needed.  Reviewed labs as above - will order TSH,lipid panel,hba1c, LFTs, ammonia , vitamin b12, folate,rpr,prolactin. CSW will start working on disposition.  Patient to participate in therapeutic milieu .          Medical Decision Making:  Review of Psycho-Social Stressors (1), Review or order clinical lab tests (1), Review and summation of old records (2), Established Problem, Worsening (2), Review of Last Therapy Session (1), Review of Medication Regimen & Side Effects (2) and Review of New Medication or Change in Dosage (2)  I certify that inpatient services furnished can reasonably be expected to improve the patient's condition.   Kaity Pitstick MD 6/28/20161:49 PM   Writer was able to contact Kelton Pillarracey Holt [phone]- [phone] - mother - per mother pt was not prescribed Risperidone when [facility]. Pt thus stopped taking Risperidone and this could have resulted in his decompensation. Per mother pt acts this way , when he is not on medications . Per mother she does not think he is abusing any substances. Per mother [facility] day time. Mom is very involved in patient's care.  Jomarie LongsSaramma Najat Olazabal ,MD Attending Psychiatrist  [facility] 2:30PM 12/11/14.

## 2014-12-11 NOTE — Tx Team (Signed)
Initial Interdisciplinary Treatment Plan   PATIENT STRESSORS: Medication change or noncompliance Substance abuse   PATIENT STRENGTHS: UTA, pt not verbally responding this morning   PROBLEM LIST: Problem List/Patient Goals Date to be addressed Date deferred Reason deferred Estimated date of resolution  Safety 12/11/2014     Psychosis 12/11/2014     "I need my medication, depakote." 12/11/2014     Pt refuses to list another goal for treatment plan 12/11/2014                                    DISCHARGE CRITERIA:  Improved stabilization in mood, thinking, and/or behavior Need for constant or close observation no longer present Verbal commitment to aftercare and medication compliance  PRELIMINARY DISCHARGE PLAN: Attend aftercare/continuing care group Outpatient therapy  PATIENT/FAMIILY INVOLVEMENT: This treatment plan has been presented to and reviewed with the patient, Cody [name].  The patient and family have been given the opportunity to ask questions and make suggestions.  Cody [name], Cody [name] 12/11/2014, 10:53 AM

## 2014-12-11 NOTE — [facility] Suicide Risk Assessment (Signed)
[facility]   Nursing information obtained from:  Patient Demographic factors:  Male Current Mental Status:  NA Loss Factors:  NA Historical Factors:  NA Risk Reduction Factors:  Living with another person, especially a relative Total Time spent with patient: 30 minutes Principal Problem: Schizoaffective disorder, bipolar type Diagnosis:   Patient Active Problem List   Diagnosis Date Noted  . Cannabis use disorder, severe, dependence [F12.20] 12/11/2014  . Schizoaffective disorder, bipolar type [F25.0]      Continued Clinical Symptoms:  Alcohol Use Disorder Identification Test Final Score (AUDIT): 1 The "Alcohol Use Disorders Identification Test", Guidelines for Use in Primary Care, Second Edition.  World Science writerHealth Organization [facility](WHO). Score between 0-7:  no or low risk or alcohol related problems. Score between 8-15:  moderate risk of alcohol related problems. Score between 16-19:  high risk of alcohol related problems. Score 20 or above:  warrants further diagnostic evaluation for alcohol dependence and treatment.   CLINICAL FACTORS:   Currently Psychotic Unstable or Poor Therapeutic Relationship Previous Psychiatric Diagnoses and Treatments   Musculoskeletal: Strength & Muscle Tone: within normal limits Gait & Station: normal Patient leans: N/A  Psychiatric Specialty Exam: Physical Exam  Review of Systems  Unable to perform ROS: mental acuity    Blood pressure 114/70, pulse 102, temperature 98.9 F (37.2 C), temperature source Oral, resp. rate 18, height 5\' 7"  (1.702 m), weight 73.936 kg (163 lb).Body mass index is 25.52 kg/(m^2).  General Appearance: Disheveled  Eye Contact::  Poor  Speech:  non- communicative  Volume:  pt refuses to speak  Mood:  unable to express  Affect:  Restricted  Thought Process:  Disorganized and Irrelevant  Orientation:  Other:  unable to assess  Thought Content:  appears to be responding to internal stimuli -  pt is supsicious , paranoid, threw coffee on another patient this AM and had to be medicated  Suicidal Thoughts:  unable to assess  Homicidal Thoughts:  pt did not express any  Memory:  Immediate;   unable to assess Recent;   unable to assess Remote;   unable to assess  Judgement:  Impaired  Insight:  Lacking  Psychomotor Activity:  Decreased  Concentration:  poor  Recall:  Poor  Fund of Knowledge:does not communicate  Language: Poor  Akathisia:  No    AIMS (if indicated):     Assets:  Others:  access to health care  Sleep:  Number of Hours: 5.25  Cognition: Impaired,  Mild  ADL's:  Impaired     COGNITIVE FEATURES THAT CONTRIBUTE TO RISK:  Closed-mindedness, Polarized thinking and Thought constriction (tunnel vision)    SUICIDE RISK: unable to assess risk- will keep patient on 1:1 observation. However patient is a danger to self or others - due to paranoia, psychosis.    PLAN OF CARE: Please see H&P.   Medical Decision Making:  Review of Psycho-Social Stressors (1), Review or order clinical lab tests (1), Review and summation of old records (2), Established Problem, Worsening (2), Review of Last Therapy Session (1), Review of Medication Regimen & Side Effects (2) and Review of New Medication or Change in Dosage (2)  I certify that inpatient services furnished can reasonably be expected to improve the patient's condition.   Ludwin Flahive MD 12/11/2014, 1:40 PM

## 2014-12-11 NOTE — Progress Notes (Signed)
Patient lacks capacity to participate in disposition planning.  Annarae Macnair ,MD Attending Psychiatrist  [facility]  

## 2014-12-11 NOTE — Tx Team (Signed)
Interdisciplinary Treatment Plan Update (Adult) Date: 12/11/2014   Time Reviewed: 9:30 AM  Progress in Treatment: Attending groups: No Participating in groups: No Taking medication as prescribed: Yes Tolerating medication: Yes Family/Significant other contact made: No, CSW assessing for collateral contacts at this time Patient understands diagnosis: Yes Discussing patient identified problems/goals with staff: No Medical problems stabilized or resolved: Yes Denies suicidal/homicidal ideation: Yes Issues/concerns per patient self-inventory: Yes Other:  New problem(s) identified: N/A  Discharge Plan or Barriers:  6/28: CSW continuing to assess.  Reason for Continuation of Hospitalization:  Depression Anxiety Medication Stabilization   Comments: N/A  Estimated length of stay: 3-5 days  For review of initial/current patient goals, please see plan of care.  Patient is a 24 year old Male  brought to the ED because he was sitting in grass with his eyes fluttering and refusing to speak.Therefore, bystander called EMS. Patient will benefit from crisis stabilization, medication evaluation, group therapy, and psycho education in addition to case management for discharge planning. Patient and CSW reviewed pt's identified goals and treatment plan. Pt verbalized understanding and agreed to treatment plan.   Attendees: Patient:    Family:    Physician: Dr. Elna BreslowEappen  12/11/2014 9:30 AM  Nursing: Quintella ReichertBeverly Knight, Rodney LangtonBrittany Guthrie, Michelle, RN 12/11/2014 9:30 AM  Clinical Social Worker: Samuella BruinKristin Kollins Fenter,  LCSWA 12/11/2014 9:30 AM  Other:  12/11/2014 9:30 AM  Other: Leisa LenzValerie Enoch, [facility] Liaison 12/11/2014 9:30 AM   12/11/2014 9:30 AM   12/11/2014 9:30 AM  Other:    Other:    Other:    Other:    Other:      Scribe for Treatment Team:  Samuella BruinKristin Laylynn Campanella, MSW, [employer]CSWA [phone]

## 2014-12-11 NOTE — Progress Notes (Signed)
Continuous Observation Note: D: Patient is observed resting in bed at this time. Pt's respirations are even and unlabored, pt does not appear to be in any acute distress. Pt does not open eyes and is not verbally responding to RN at this time.  A: Lunch meal tray and PO fluids placed on pt's night stand. Pt remains on Continuous Observation per providers orders, MHT with pt at all times. 15 minute checks continued per protocol for patient safety.  R: Pt remains safe.

## 2014-12-11 NOTE — Clinical Social Work Note (Signed)
Patient has care coordinator, [name], [facility] [phone]).  CSW left message and requested call back.  [name], LCSW Clinical Social Worker

## 2014-12-11 NOTE — [facility] Counselor (Signed)
Per chart review, patient is nonverbal at this time and unable to complete assessment. CSW will attempt PSA at a later time.  Samuella BruinKristin Shital Crayton, MSW, [employer]CSWA Clinical Social Worker [facility] [phone]

## 2014-12-11 NOTE — Progress Notes (Signed)
Patient ID: Jeni Sallesnthony Curling, male   DOB: 02-06-1991, 24 y.o.   MRN: [id number] D: Client in bed most of the shift, up for snacks and medications, denies AVH, but clearly preoccupied, blocking, slow to respond, interaction minimal. A: Writer provides emotional support, encourages conversation, revisit incident today about his reacting and throwing coffee on another client. Client remarked "yes' he did it. "irritability" "just lost it" Staff will monitor q8515min for safety. R: Client is safe on the unit, attended group. Writer educated client on how important it is to channel his anger into a positive way i.e. Walking away, self help talk, calling for staff.

## 2014-12-11 NOTE — Clinical Social Work Note (Signed)
Victorino DikeJennifer, care coordinator, [facility] - caseworker (Jae DireKate MohrsvilleBlackaby) at [facility] says he is only attending twice/week and minimally participating.  Recommend referral for ACT services.  Per care coordinator, [facility] may have slot open for [employer] funded clients.  Per care coordinator patient is unemployed, lives w mother, [facility].    Santa GeneraAnne Kryssa Risenhoover, LCSW Clinical Social Worker

## 2014-12-11 NOTE — [facility] Group Notes (Signed)
[facility] LCSW Group Therapy 12/11/2014  1:15 PM   Type of Therapy: Group Therapy  Participation Level: Did Not Attend. Patient invited to participate but declined.   Yuriko Portales, MSW, LCSWA Clinical Social Worker [facility] [phone]   

## 2014-12-11 NOTE — Progress Notes (Signed)
Continuous Observation Note: D: Patient observed resting in bed at this time. Respirations even and unlabored. Pt is fidgety. Elective mutism at times. Pt states "I need my medicine, Depakote." Pt does not respond when RN asks if he has any concerns/needs at this time. A: Pt placed on continuous observation by MD Eappen d/t pt's aggressive behavior this morning. RN with pt, until MHT arrived, staff with pt at all times per providers orders. 15 minute checks continued per protocol for patient safety.  R: Pt remains safe.

## 2014-12-11 NOTE — Progress Notes (Signed)
D: Patient is alert. Patient has only verbally responded to RN once this morning. UTA SI/HI and AVH d/t elective mutism. Pt stated this morning soft and slowly "I need my medicine, depakote." Pt appears preoccupied, pt appears to be thought blocking. Pt avoids eye contact, or is glaring at times. Pt was observed posturing towards another pt this morning in the day room with fists clinched guarding his face, per staff member, pt threw coffee on/towards another pt. Pt resting post zyprexa administration this morning. Pt HIGH RISK for aggression; nursing judgment. Pt is not attending unit groups. A: MD Eappen made aware of pt's agitation and aggression, one time medication order acknowledged and administered. Encouragement/Support provided to pt. Pt placed on and remains on Continuous Observation today per providers orders (See additional notes at 0900, 1300, and 1700). EKG completed, paper print out placed on pt's chart, MD Eappen made aware. Pt encouraged to contact his mother, per MD Eappen's request. Medication education reviewed with pt. Scheduled medications administered per providers orders (See MAR). 15 minute checks continued per protocol for patient safety.  R: Pt needs frequent redirection. Pt remains safe. Pt unable to attend unit groups today. Pt tolerated EKG procedure with ease.

## 2014-12-11 NOTE — Progress Notes (Signed)
Continuous Observation Note: D: Patient is observed resting in bed with head covered with blanket. Pt appears to be in no acute distress, respirations are even and unlabored. A: Pt remains on Continuous Observation per providers orders, MHT with pt at all times. 15 minute checks continued per protocol for patient safety.  R: Pt remains safe.

## 2014-12-11 NOTE — Plan of Care (Signed)
Problem: Ineffective individual coping Goal: STG: Patient will remain free from self harm Outcome: Progressing Patient remains free from self harm. 15 minute checks continued per protocol for patient safety.   Problem: Diagnosis: Increased Risk For Suicide Attempt Goal: STG-Patient Will Attend All Groups On The Unit Outcome: Not Progressing Patient is not attending unit groups today. Goal: STG-Patient Will Comply With Medication Regime Outcome: Progressing Patient has adhered to medication regimen today with ease.      

## 2014-12-11 NOTE — [facility] Group Notes (Signed)
[facility] Group Notes:  (Nursing/MHT/Case Management/Adjunct)  Date:  12/11/2014  Time:  0915am  Type of Therapy:  Nurse Education  Participation Level:  Did Not Attend  Participation Quality:  Did not attend  Affect:  Did not attend  Cognitive:  Did not attend  Insight:  None  Engagement in Group:  Did not attend  Modes of Intervention:  Discussion, Education and Support  Summary of Progress/Problems: Patient did not attend group d/t agitation and aggressive state this morning.  Lendell CapriceGuthrie, Lynnwood Beckford A 12/11/2014, 9:43 AM

## 2014-12-12 LAB — COMPREHENSIVE METABOLIC PANEL
ALBUMIN: 4.2 g/dL (ref 3.5–5.0)
ALT: 14 U/L — ABNORMAL LOW (ref 17–63)
AST: 21 U/L (ref 15–41)
Alkaline Phosphatase: 52 U/L (ref 38–126)
Anion gap: 9 (ref 5–15)
BUN: 12 mg/dL (ref 6–20)
CALCIUM: 9.2 mg/dL (ref 8.9–10.3)
CO2: 26 mmol/L (ref 22–32)
CREATININE: 0.99 mg/dL (ref 0.61–1.24)
Chloride: 104 mmol/L (ref 101–111)
GFR calc Af Amer: >60 mL/min (ref >60)
Glucose, Bld: 118 mg/dL — ABNORMAL HIGH (ref 65–99)
Potassium: 3.9 mmol/L (ref 3.5–5.1)
Sodium: 139 mmol/L (ref 135–145)
Total Bilirubin: 0.8 mg/dL (ref 0.3–1.2)
Total Protein: 6.7 g/dL (ref 6.5–8.1)

## 2014-12-12 LAB — LIPID PANEL
Cholesterol: 164 mg/dL (ref 0–200)
HDL: 48 mg/dL (ref >40)
LDL Cholesterol: 89 mg/dL (ref 0–99)
Total CHOL/HDL Ratio: 3.4 RATIO
Triglycerides: 134 mg/dL (ref <150)
VLDL: 27 mg/dL (ref 0–40)

## 2014-12-12 LAB — VITAMIN B12: VITAMIN B 12: 626 pg/mL (ref 180–914)

## 2014-12-12 LAB — FOLATE: Folate: 8.5 ng/mL (ref >5.9)

## 2014-12-12 LAB — TSH: TSH: 0.535 u[IU]/mL (ref 0.350–4.500)

## 2014-12-12 NOTE — Progress Notes (Addendum)
[facility] MD Progress Note  12/12/2014 2:38 PM Cody [name]  MRN:  [id number] Subjective: Patient states " I am OK. I hear voices .'  Objective: Patient is a 39 y old AAM , who has a hx of schizoaffective disorder , who presented to [facility] on 12/03/14 - brought in as Cody [name] - after bystanders called them after finding patient unresponsive. Patient was medically cleared in ED - had CT scan head done (unremarkable) , EEG done - no seizure like activity found, UDS - positive for THC.  Pt was observed to be non communicative in ED, also appeared to be aggressive requiring chemical as well as physical restraints. Pt was placed on [facility] list for higher level of care - however was transferred to [facility] for psychiatric care .  Pt seen this AM. Pt was mute yesterday, however patient responded to all questions writer asked. Pt is a poor historian , however his improvement in speech is definitely an improvement from his presentation. Pt endorses AH , is unable to elaborate . Pt also reports anxiety issues - does not elaborate. Discussed medication changes with patient. Patient decompensated 2/2 not being prescribed Risperidone by [facility] - pt unable to state if he had developed side effects or why he was not given another antipsychotic medication.  Discussed that we will closely monitor for ADRs here . Pt per staff has been seen more on the unit , than yesterday , has been more communicative than yesterday . Pt denies SI/HI. Pt was placed on 1:1 for aggression on the unit - pt continues to be unpredictable - will continue 1:1 .   Principal Problem: Schizoaffective disorder, bipolar type Diagnosis:   Patient Active Problem List   Diagnosis Date Noted  . Cannabis use disorder, severe, dependence [F12.20] 12/11/2014  . Schizoaffective disorder, bipolar type [F25.0]    Total Time spent with patient: 30 minutes   Past Medical History:  Past Medical History  Diagnosis Date  . Schizophrenia   . Anxiety     History reviewed. No pertinent past surgical history. Family History: patient did not express any family hx. Social History:  History  Alcohol Use No     History  Drug Use  . Yes  . Special: Marijuana    History   Social History  . Marital Status: Single    Spouse Name: N/A  . Number of Children: N/A  . Years of Education: N/A   Social History Main Topics  . Smoking status: Smoker, Current Status Unknown  . Smokeless tobacco: Never Used  . Alcohol Use: No  . Drug Use: Yes    Special: Marijuana  . Sexual Activity: Yes   Other Topics Concern  . None   Social History Narrative   Additional History:    Sleep: Fair  Appetite:  Fair    Musculoskeletal: Strength & Muscle Tone: within normal limits Gait & Station: normal Patient leans: N/A   Psychiatric Specialty Exam: Physical Exam  Review of Systems  Psychiatric/Behavioral: Positive for hallucinations. The patient is nervous/anxious.   All other systems reviewed and are negative.   Blood pressure 103/62, pulse 101, temperature 99.1 F (37.3 C), temperature source Oral, resp. rate 18, height  (1.702 m), weight 73.936 kg (163 lb).Body mass index is 25.52 kg/(m^2).  General Appearance: Disheveled  Eye Solicitor::  Fair  Speech:  Slow  Volume:  Decreased  Mood:  Dysphoric  Affect:  Flat  Thought Process:  Irrelevant  Orientation:  Full (Time, Place,  and Person)  Thought Content:  Hallucinations: Auditory and Paranoid Ideation  Suicidal Thoughts:  No  Homicidal Thoughts:  No  Memory:  Immediate;   Fair Recent;   Fair Remote;   Fair  Judgement:  Impaired  Insight:  Shallow  Psychomotor Activity:  Decreased  Concentration:  Poor  Recall:  [employer] of Knowledge:Fair  Language: Fair  Akathisia:  No  Handed:  Right  AIMS (if indicated):     Assets:  Physical Health Social Support  ADL's:  Intact  Cognition: WNL  Sleep:  Number of Hours: 5.5     Current Medications: Current  Facility-Administered Medications  Medication Dose Route Frequency Provider Last Rate Last Dose  . acetaminophen (TYLENOL) tablet 650 mg  650 mg Oral Q6H PRN Thermon Leyland, NP   650 mg at 12/11/14 1859  . alum & mag hydroxide-simeth (MAALOX/MYLANTA) 200-200-20 MG/5ML suspension 30 mL  30 mL Oral Q4H PRN Thermon Leyland, NP      . benztropine (COGENTIN) tablet 0.5 mg  0.5 mg Oral BID Thermon Leyland, NP   0.5 mg at 12/12/14 1610  . divalproex (DEPAKOTE) DR tablet 500 mg  500 mg Oral Q12H Dahiana Kulak, MD   500 mg at 12/12/14 0810  . hydrOXYzine (ATARAX/VISTARIL) tablet 25 mg  25 mg Oral Q6H PRN Thermon Leyland, NP   25 mg at 12/12/14 0046  . LORazepam (ATIVAN) tablet 0.5 mg  0.5 mg Oral BID Jomarie Longs, MD   0.5 mg at 12/12/14 0810  . magnesium hydroxide (MILK OF MAGNESIA) suspension 30 mL  30 mL Oral Daily PRN Thermon Leyland, NP      . nicotine (NICODERM CQ - dosed in mg/24 hours) patch 21 mg  21 mg Transdermal Daily Jomarie Longs, MD   21 mg at 12/12/14 0825  . OLANZapine zydis (ZYPREXA) disintegrating tablet 10 mg  10 mg Oral Q8H PRN Jomarie Longs, MD       Or  . OLANZapine (ZYPREXA) injection 10 mg  10 mg Intramuscular Q8H PRN Gerrica Cygan, MD      . risperiDONE (RISPERDAL) tablet 2 mg  2 mg Oral BID Jomarie Longs, MD   2 mg at 12/12/14 0810  . traZODone (DESYREL) tablet 50 mg  50 mg Oral QHS PRN Thermon Leyland, NP   50 mg at 12/11/14 2142    Lab Results: No results found for this or any previous visit (from the past 48 hour(s)).  Physical Findings: AIMS: Facial and Oral Movements Muscles of Facial Expression: None, normal Lips and Perioral Area: None, normal Jaw: None, normal Tongue: None, normal,Extremity Movements Upper (arms, wrists, hands, fingers): None, normal Lower (legs, knees, ankles, toes): None, normal, Trunk Movements Neck, shoulders, hips: None, normal, Overall Severity Severity of abnormal movements (highest score from questions above): None, normal Incapacitation due  to abnormal movements: None, normal Patient's awareness of abnormal movements (rate only patient's report): No Awareness, Dental Status Current problems with teeth and/or dentures?: No Does patient usually wear dentures?: No  CIWA:    COWS:     Assessment: Pt is a 29 y old AAM with hx of schizoaffective do , presented unresponsive to ED as Cody [name] , later identified as patient , had decompensated after he had stopped taking risperidone . Pt was mute on admission- all work up done (see above) in ED were unremarkable. Pt continues to make progress - will continue treatment.    Treatment Plan Summary: Daily contact with patient to  assess and evaluate symptoms and progress in treatment and Medication management Will continue Risperidone  2 mg po bid for psychosis. Will continue Cogentin 0.5 mg po bid for EPS. Will continue Depakote DR 500 mg po bid for mood lability. Depakote level - 12/15/14. Will continue Ativan 0.5 mg po bid for anxiety sx. Will make available Zyprexa 10 mg po/im q8h prn for severe anxiety/agitation. Pt to be observed on 1:1 due to safety reasons.  Collateral obtained from mother - (12/11/14) Writer was able to contact Cody [name] [phone]- [phone] - mother - per mother pt was not prescribed Risperidone when [facility]. Pt thus stopped taking Risperidone and this could have resulted in his decompensation. Per mother pt acts this way , when he is not on medications . Per mother she does not think he is abusing any substances. Per mother [facility]. Mom is very involved in patient's care.  Will continue to monitor vitals ,medication compliance and treatment side effects while patient is here.  Will monitor for medical issues as well as call consult as needed.  Reviewed labs as above - will orderered  TSH,lipid panel,hba1c, LFTs, ammonia , vitamin b12,  folate,rpr,prolactin- pt refused labs - will order again. CSW will start working on disposition.  Patient to participate in therapeutic milieu .    Medical Decision Making:  Review of Psycho-Social Stressors (1), Review or order clinical lab tests (1), Review of Last Therapy Session (1), Review of Medication Regimen & Side Effects (2) and Review of New Medication or Change in Dosage (2)     Cody Walck md 12/12/2014, 2:38 PM

## 2014-12-12 NOTE — Progress Notes (Signed)
Adult Psychoeducational Group Note  Date:  12/12/2014 Time:  10:33 PM  Group Topic/Focus:  Wrap-Up Group:   The focus of this group is to help patients review their daily goal of treatment and discuss progress on daily workbooks.  Participation Level:  Did Not Attend  Participation Quality:  Did not attend  Affect:  Did not attend  Cognitive:  Did not attend  Insight: None  Engagement in Group:  Did not attend  Modes of Intervention:  Did not attend  Additional Comments:  Patient did not attend group tonight.   Lita MainsFrancis, Deshanti Adcox [facility] 12/12/2014, 10:33 PM

## 2014-12-12 NOTE — Plan of Care (Signed)
Problem: Alteration in thought process Goal: LTG-Patient behavior demonstrates decreased signs psychosis (Patient behavior demonstrates decreased signs of psychosis to the point the patient is safe to return home and continue treatment in an outpatient setting.)  Outcome: Not Progressing Pt presents as if he is responding to internal stimuli.  Goal: STG-Patient is able to follow short directions Outcome: Progressing Pt follows directions with prompting  Problem: Diagnosis: Increased Risk For Suicide Attempt Goal: LTG-Patient Will Report Improvement in Psychotic Symptoms LTG (by discharge) : Patient will report improvement in psychotic symptoms.  Outcome: Not Progressing Pt appears to be responding

## 2014-12-12 NOTE — Progress Notes (Signed)
D: Pt Mood is empty.   Pt Affect is flat.  Pt was mute when RN assessed pt this morning.  When RN asked pt if pt wanted nicotine patch, pt shook head no which was barely perceptible.  Pt later approached RN and asked for nicotine patch.  RN unable to obtain complete assessment of pt secondary to pt's lack of response.  Pt did not attend breakfast; pt did not attend AM group.  Pt stayed in bed most of morning.    A: Patient given emotional support from RN. Patient encouraged to come to staff with concerns and/or questions. Patient's medication routine continued. Patient's orders and plan of care reviewed. Will continue to monitor patient 1:1 for safety.   R: Patient's behavior remains isolative.  Pt safety maintained.
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
## 2014-12-12 NOTE — Progress Notes (Signed)
D: Pt denies SI/HI/AVH. Pt is pleasant and cooperative. Pt seen on the unit at times, but not interacting. Pt appears to be responding even though he denies, pt presents with thought blocking. Pt tried to cheek his medications at the med window.   A: Pt was offered support and encouragement. Pt was given scheduled medications. Pt was encourage to attend groups. Q 15 minute checks were done for safety.   R:Pt attends groups and interacts well with peers and staff. Pt is taking medication. Pt has no complaints at this time .Pt receptive to treatment and safety maintained on unit.

## 2014-12-13 LAB — RPR: RPR Ser Ql: NONREACTIVE

## 2014-12-13 MED ORDER — RISPERIDONE 2 MG PO TABS
2.5000 mg | ORAL_TABLET | Freq: Two times a day (BID) | ORAL | Status: DC
  Administered 2014-12-13 – 2014-12-14 (×2): 2.5 mg via ORAL
  Filled 2014-12-13 (×4): qty 1

## 2014-12-13 MED ORDER — LORAZEPAM 0.5 MG PO TABS
0.5000 mg | ORAL_TABLET | Freq: Every day | ORAL | Status: AC
  Administered 2014-12-14 – 2014-12-16 (×3): 0.5 mg via ORAL
  Filled 2014-12-13 (×3): qty 1

## 2014-12-13 NOTE — Progress Notes (Signed)
D: Pt denies SI/HI/AVH. Pt is pleasant and cooperative. Pt said he is not responding but appears to be at times. Pt is doing much better, pt is talking and interacting with his sitter and other pt's. Pt stated he can't explain how he was feeling when he came in.  A: Pt was offered support and encouragement. Pt was given scheduled medications. Pt was encourage to attend groups. Q 15 minute checks were done for safety.   R:Pt attends groups and interacts well with peers and staff. Pt is taking medication. Pt has no complaints at this time .Pt receptive to treatment and safety maintained on unit. Pt has improvement in his appearance and pt responses are not delayed and pt does not have the same blank look on his face.

## 2014-12-13 NOTE — Plan of Care (Signed)
Problem: Alteration in thought process Goal: LTG-Patient behavior demonstrates decreased signs psychosis (Patient behavior demonstrates decreased signs of psychosis to the point the patient is safe to return home and continue treatment in an outpatient setting.)  Outcome: Not Progressing Pt reports auditory hallucinations today as "seeing evil." Pt reported HI toward his roommate and pt does not have a roommate.  Goal: LTG-Patient verbalizes understanding importance med regimen (Patient verbalizes understanding of importance of medication regimen and need to continue outpatient care.)  Outcome: Not Progressing Pt attempted to spit out his medication before swallowing it this morning.

## 2014-12-13 NOTE — Progress Notes (Signed)
D:Pt is resting in his bed following dinner. Respirations are even and unlabored. A:Offered support and 1:1 observation. R:Safety maintained on the unit.

## 2014-12-13 NOTE — [facility] Group Notes (Signed)
[facility] Group Notes:  (Counselor/Nursing/MHT/Case Management/Adjunct)  12/13/2014 1:15PM  Type of Therapy:  Group Therapy  Participation Level:  Invited.  Chose to not come.  Was in hall after group.  First time I had seen him out of his room.  He is dressed.  Asked when he could be discharged.  Told him the first step is getting him off of 1:1.  Summary of Progress/Problems: The topic for group was balance in life.  Pt participated in the discussion about when their life was in balance and out of balance and how this feels.  Pt discussed ways to get back in balance and short term goals they can work on to get where they want to be.    Ida Rogueorth, Stonewall Doss B 12/13/2014 4:37 PM

## 2014-12-13 NOTE — Progress Notes (Signed)
Nursing 1:1 note D:Pt came to nursing station requesting to sleep in the quiet room.  A: 1:1 observation continues for safety . Asked pt why he wants to sleep in the quiet room. Pt informed that he has a private room and his room can be quiet and dark . R: pt remains safe pt stated he wanted to sleep in the quiet room because it was quiet. Pt went back to his room.

## 2014-12-13 NOTE — Progress Notes (Signed)
Nursing 1:1 note D:Pt observed sleeping in bed with eyes closed. RR even and unlabored. No distress noted. A: 1:1 observation continues for safety  R: pt remains safe  

## 2014-12-13 NOTE — Progress Notes (Signed)
[facility] MD Progress Note  12/13/2014 1:28 PM Cody [name]  MRN:  [id number] Subjective: Patient states " I am OK. "  Objective: Patient is a 59 y old AAM , who has a hx of schizoaffective disorder , who presented to [facility] on 12/03/14 - brought in as [facility] - after bystanders called them after finding patient unresponsive. Patient was medically cleared in ED - had CT scan head done (unremarkable) , EEG done - no seizure like activity found, UDS - positive for THC.  Pt was observed to be non communicative in ED, also appeared to be aggressive requiring chemical as well as physical restraints. Pt was placed on [facility] list for higher level of care - however was transferred to [facility] for psychiatric care .  Pt seen this AM. Pt continues to be a poor historian , very minimal interaction. Pt answers questions in monosyllables . Pt denies any concerns today - reports AH as better. Pt denies any ADRS of medications. Per staff - pt endorses HI towards room mate - off note: pt has no room mate - unknown if he meant his sitter . Will observe pt on the unit.  Pt was placed on 1:1 for aggression on the unit - pt continues to be unpredictable - will continue 1:1 .   Principal Problem: Schizoaffective disorder, bipolar type Diagnosis:   Patient Active Problem List   Diagnosis Date Noted  . Cannabis use disorder, severe, dependence [F12.20] 12/11/2014  . Schizoaffective disorder, bipolar type [F25.0]    Total Time spent with patient: 30 minutes   Past Medical History:  Past Medical History  Diagnosis Date  . Schizophrenia   . Anxiety    History reviewed. No pertinent past surgical history. Family History: patient did not express any family hx. Social History:  History  Alcohol Use No     History  Drug Use  . Yes  . Special: Marijuana    History   Social History  . Marital Status: Single    Spouse Name: N/A  . Number of Children: N/A  . Years of Education: N/A   Social History Main  Topics  . Smoking status: Smoker, Current Status Unknown  . Smokeless tobacco: Never Used  . Alcohol Use: No  . Drug Use: Yes    Special: Marijuana  . Sexual Activity: Yes   Other Topics Concern  . None   Social History Narrative   Additional History:    Sleep: Fair  Appetite:  Fair    Musculoskeletal: Strength & Muscle Tone: within normal limits Gait & Station: normal Patient leans: N/A   Psychiatric Specialty Exam: Physical Exam  Review of Systems  Psychiatric/Behavioral: Positive for hallucinations. The patient is nervous/anxious.   All other systems reviewed and are negative.   Blood pressure 108/60, pulse 108, temperature 99.5 F (37.5 C), temperature source Oral, resp. rate 16, height _0  (1.702 m), weight 73.936 kg (163 lb), SpO2 100 %.Body mass index is 25.52 kg/(m^2).  General Appearance: Disheveled  Eye Sport and exercise psychologist::  Fair  Speech:  Slow  Volume:  Decreased  Mood:  Dysphoric  Affect:  Flat  Thought Process:  Irrelevant  Orientation:  Full (Time, Place, and Person)  Thought Content:  Hallucinations: Auditory and Paranoid Ideation  Suicidal Thoughts:  No  Homicidal Thoughts:  Yes.  without intent/plan  Memory:  Immediate;   Fair Recent;   Fair Remote;   Fair  Judgement:  Impaired  Insight:  Shallow  Psychomotor Activity:  Decreased  Concentration:  Poor  Recall:  [employer] of Knowledge:Fair  Language: Fair  Akathisia:  No  Handed:  Right  AIMS (if indicated):     Assets:  Physical Health Social Support  ADL's:  Intact  Cognition: WNL  Sleep:  Number of Hours: 4.75     Current Medications: Current Facility-Administered Medications  Medication Dose Route Frequency Provider Last Rate Last Dose  . acetaminophen (TYLENOL) tablet 650 mg  650 mg Oral Q6H PRN Niel Hummer, NP   650 mg at 12/12/14 2002  . alum & mag hydroxide-simeth (MAALOX/MYLANTA) 200-200-20 MG/5ML suspension 30 mL  30 mL Oral Q4H PRN Niel Hummer, NP      . benztropine  (COGENTIN) tablet 0.5 mg  0.5 mg Oral BID Niel Hummer, NP   0.5 mg at 12/13/14 0827  . divalproex (DEPAKOTE) DR tablet 500 mg  500 mg Oral Q12H Chinonso Linker, MD   500 mg at 12/13/14 0826  . hydrOXYzine (ATARAX/VISTARIL) tablet 25 mg  25 mg Oral Q6H PRN Niel Hummer, NP   25 mg at 12/12/14 0046  . [START ON 12/14/2014] LORazepam (ATIVAN) tablet 0.5 mg  0.5 mg Oral Daily Airis Barbee, MD      . magnesium hydroxide (MILK OF MAGNESIA) suspension 30 mL  30 mL Oral Daily PRN Niel Hummer, NP      . nicotine (NICODERM CQ - dosed in mg/24 hours) patch 21 mg  21 mg Transdermal Daily Ursula Alert, MD   21 mg at 12/13/14 0825  . OLANZapine zydis (ZYPREXA) disintegrating tablet 10 mg  10 mg Oral Q8H PRN Ursula Alert, MD   10 mg at 12/13/14 0216   Or  . OLANZapine (ZYPREXA) injection 10 mg  10 mg Intramuscular Q8H PRN Zyonna Vardaman, MD      . risperiDONE (RISPERDAL) tablet 2.5 mg  2.5 mg Oral BID Ursula Alert, MD      . traZODone (DESYREL) tablet 50 mg  50 mg Oral QHS PRN Niel Hummer, NP   50 mg at 12/11/14 2142    Lab Results:  Results for orders placed or performed during the hospital encounter of 12/10/14 (from the past 48 hour(s))  Comprehensive metabolic panel     Status: Abnormal   Collection Time: 12/12/14  7:17 PM  Result Value Ref Range   Sodium 139 135 - 145 mmol/L   Potassium 3.9 3.5 - 5.1 mmol/L   Chloride 104 101 - 111 mmol/L   CO2 26 22 - 32 mmol/L   Glucose, Bld 118 (H) 65 - 99 mg/dL   BUN 12 6 - 20 mg/dL   Creatinine, Ser 0.99 0.61 - 1.24 mg/dL   Calcium 9.2 8.9 - 10.3 mg/dL   Total Protein 6.7 6.5 - 8.1 g/dL   Albumin 4.2 3.5 - 5.0 g/dL   AST 21 15 - 41 U/L   ALT 14 (L) 17 - 63 U/L   Alkaline Phosphatase 52 38 - 126 U/L   Total Bilirubin 0.8 0.3 - 1.2 mg/dL   GFR calc non Af Amer >60 >60 mL/min   GFR calc Af Amer >60 >60 mL/min    Comment: (NOTE) The eGFR has been calculated using the CKD EPI equation. This calculation has not been validated in all clinical  situations. eGFR's persistently <60 mL/min signify possible Chronic Kidney Disease.    Anion gap 9 5 - 15    Comment: Performed at [facility]  Folate     Status:  None   Collection Time: 12/12/14  7:17 PM  Result Value Ref Range   Folate 8.5 >5.9 ng/mL    Comment: Performed at [facility]  Lipid panel     Status: None   Collection Time: 12/12/14  7:17 PM  Result Value Ref Range   Cholesterol 164 0 - 200 mg/dL   Triglycerides 134 <150 mg/dL   HDL 48 >40 mg/dL   Total CHOL/HDL Ratio 3.4 RATIO   VLDL 27 0 - 40 mg/dL   LDL Cholesterol 89 0 - 99 mg/dL    Comment:        Total Cholesterol/HDL:CHD Risk Coronary Heart Disease Risk Table                     Men   Women  1/2 Average Risk   3.4   3.3  Average Risk       5.0   4.4  2 X Average Risk   9.6   7.1  3 X Average Risk  23.4   11.0        Use the calculated Patient Ratio above and the CHD Risk Table to determine the patient's CHD Risk.        ATP III CLASSIFICATION (LDL):  <100     mg/dL   Optimal  100-129  mg/dL   Near or Above                    Optimal  130-159  mg/dL   Borderline  160-189  mg/dL   High  >190     mg/dL   Very High Performed at [facility]   TSH     Status: None   Collection Time: 12/12/14  7:17 PM  Result Value Ref Range   TSH 0.535 0.350 - 4.500 uIU/mL    Comment: Performed at [facility]  Vitamin B12     Status: None   Collection Time: 12/12/14  7:17 PM  Result Value Ref Range   Vitamin B-12 626 180 - 914 pg/mL    Comment: (NOTE) This assay is not validated for testing neonatal or myeloproliferative syndrome specimens for Vitamin B12 levels. Performed at [facility]     Physical Findings: AIMS: Facial and Oral Movements Muscles of Facial Expression: None, normal Lips and Perioral Area: None, normal Jaw: None, normal Tongue: None, normal,Extremity Movements Upper (arms, wrists, hands, fingers): None, normal Lower (legs,  knees, ankles, toes): None, normal, Trunk Movements Neck, shoulders, hips: None, normal, Overall Severity Severity of abnormal movements (highest score from questions above): None, normal Incapacitation due to abnormal movements: None, normal Patient's awareness of abnormal movements (rate only patient's report): No Awareness, Dental Status Current problems with teeth and/or dentures?: No Does patient usually wear dentures?: No  CIWA:    COWS:     Assessment: Pt is a 2 y old AAM with hx of schizoaffective do , presented unresponsive to ED as Cody [name] Doe , later identified as patient , had decompensated after he had stopped taking risperidone . Pt was mute on admission- all work up done  in ED were unremarkable. Pt continues to make progress - will continue treatment.    Treatment Plan Summary: Daily contact with patient to assess and evaluate symptoms and progress in treatment and Medication management Will continue Risperidone , but increase to  2.5  mg po bid for psychosis.Plan to DC patient on LAI. Will continue Cogentin 0.5 mg po bid for  EPS. Will continue Depakote DR 500 mg po bid for mood lability. Depakote level - 12/15/14. Will continue Ativan 0.5 mg po , Reduce to once daily dose - x3 doses. Will make available Zyprexa 10 mg po/im q8h prn for severe anxiety/agitation. Pt to be observed on 1:1 due to safety reasons.  Collateral obtained from mother - (12/11/14) Writer was able to contact Verdene Lennert [phone] - mother - per mother pt was not prescribed Risperidone when he went to [facility] for his follow up appointment after getting discharged from [facility] in may 2016. Pt thus stopped taking Risperidone and this could have resulted in his decompensation. Per mother pt acts this way , when he is not on medications . Per mother she does not think he is abusing any substances. Per mother pt goes to [employer] [facility] during day time. Mom is very involved in patient's care.  Will continue  to monitor vitals ,medication compliance and treatment side effects while patient is here.  Will monitor for medical issues as well as call consult as needed.  Reviewed labs - tsh, lipid, folate vitamin b12- wnl. CSW will start working on disposition.  Patient to participate in therapeutic milieu .    Medical Decision Making:  Review of Psycho-Social Stressors (1), Review or order clinical lab tests (1), Review of Last Therapy Session (1), Review of Medication Regimen & Side Effects (2) and Review of New Medication or Change in Dosage (2)     Ethne Jeon md 12/13/2014, 1:28 PM

## 2014-12-13 NOTE — Progress Notes (Signed)
D:Pt is in the dayroom watching TV and not interacting with peers. A:Pt is being monitored 1:1 for unstable behaviors. R:Safety maintained on the unit.

## 2014-12-13 NOTE — Progress Notes (Signed)
Pt tried to come out his room ad sleep in the hallway. Pt was informed he could not sleep in the floor outside his room. He was told he could sleep on the floor in his room. Pt was asked if anything was wrong, did he need to talk about something. Pt stated no and got up and went to his room.

## 2014-12-13 NOTE — Plan of Care (Signed)
Problem: Alteration in thought process Goal: LTG-Patient behavior demonstrates decreased signs psychosis (Patient behavior demonstrates decreased signs of psychosis to the point the patient is safe to return home and continue treatment in an outpatient setting.)  Outcome: Progressing Pt does not appear to be thought blocking this evening, responses are appropriate  Problem: Ineffective individual coping Goal: LTG: Patient will report a decrease in negative feelings Outcome: Progressing Pt said he was feeling a little better today  Problem: Diagnosis: Increased Risk For Suicide Attempt Goal: LTG-Patient Will Show Positive Response to Medication LTG (by discharge) : Patient will show positive response to medication and will participate in the development of the discharge plan.  Outcome: Progressing Pt improvement in movement and communication greatly improved from yesterday, pt interacting with people , even playing cards

## 2014-12-13 NOTE — Progress Notes (Signed)
D:Pt tried to spit out his morning medication and was redirected. He reports HI thoughts to "my roommate." Pt does not currently have a roommate. Pt denied auditory hallucinations and reports visual hallucinations as "seeing evil."  A:Offered support and 1:1 observations. R:Safety maintained on the unit.

## 2014-12-13 NOTE — Progress Notes (Signed)
Pt showed mouth after taking pill and walked away from window . pt came back to window 10 seconds later tried to show  Mouth, Clinical research associatewriter saw pill. Pt walked away when he was informed I still see the pill, so it is unknown if pt took the pill or not.

## 2014-12-14 LAB — HEMOGLOBIN A1C
Hgb A1c MFr Bld: 4.9 % (ref 4.8–5.6)
Mean Plasma Glucose: 94 mg/dL

## 2014-12-14 LAB — PROLACTIN: PROLACTIN: 76.6 ng/mL — AB (ref 4.0–15.2)

## 2014-12-14 MED ORDER — RISPERIDONE 2 MG PO TABS
2.0000 mg | ORAL_TABLET | Freq: Every day | ORAL | Status: DC
  Administered 2014-12-15 – 2014-12-19 (×5): 2 mg via ORAL
  Filled 2014-12-14 (×7): qty 1

## 2014-12-14 MED ORDER — RISPERIDONE 3 MG PO TABS
3.0000 mg | ORAL_TABLET | Freq: Every day | ORAL | Status: DC
  Administered 2014-12-14: 3 mg via ORAL
  Filled 2014-12-14 (×3): qty 1
  Filled 2014-12-14: qty 3

## 2014-12-14 MED ORDER — BENZTROPINE MESYLATE 0.5 MG PO TABS
0.5000 mg | ORAL_TABLET | Freq: Every day | ORAL | Status: DC
  Administered 2014-12-14 – 2014-12-18 (×5): 0.5 mg via ORAL
  Filled 2014-12-14 (×2): qty 1
  Filled 2014-12-14: qty 45
  Filled 2014-12-14 (×5): qty 1

## 2014-12-14 MED ORDER — BENZTROPINE MESYLATE 0.5 MG PO TABS
0.5000 mg | ORAL_TABLET | Freq: Every day | ORAL | Status: DC
  Administered 2014-12-15: 0.5 mg via ORAL
  Filled 2014-12-14 (×4): qty 1

## 2014-12-14 NOTE — Progress Notes (Signed)
D: Pt denies SI/HI/AVH. Pt is pleasant and cooperative. Pt continues to be paranoid, guarded and suspicious. Pt will communicate,but says limited things. Pt observed on unit at times going in the dayroom . Pt did interact with his visitors appearing to carry on normal convertsations.    A: Pt was offered support and encouragement. Pt was given scheduled medications. Pt was encourage to attend groups. Q 15 minute checks were done for safety.    R: Pt is taking medication. Pt has no complaints at this time .Pt receptive to treatment and safety maintained on unit.

## 2014-12-14 NOTE — Progress Notes (Signed)
D: Pt presents with flat affect and depressed mood. Pt has minimal interaction and little engagement with other pts on the unit. Pt cautious during assessment. Pt appears to be responding to internal stimuli. Pt have delayed responses and thought blocking. Pt denies SI/HI/AVH. Writer explained to pt that he would not continue to be on one to one observation per MD. Pt stated to writer that he would like to stay on one to one observation but could not specify the reason why. Pt just stood speechless as if he was confused. Pt noted to be isolative this morning and noon time. Pt compliant with taking morning meds.  A: Medications administered as ordered per MD. Verbal support given. Pt encouraged to attend groups. 15 minute checks performed for safety.  R: Pt safety maintained.

## 2014-12-14 NOTE — Progress Notes (Signed)
[facility] Post 1:1 Observation Documentation  For the first (8) hours following discontinuation of 1:1 precautions, a progress note entry by nursing staff should be documented at least every 2 hours, reflecting the patient's behavior, condition, mood, and conversation.  Use the progress notes for additional entries.  Time 1:1 discontinued:  10:09am  Patient's Behavior:  Patient in bed asleep. No obvious distress. States he is tired. No inappropriate or disruptive behaviors observed.   Patient's Condition:  Safety maintained. Staff continues to monitor patient q6215min for safety.  Patient's Conversation:  Patient with delayed responses.   Kashauna Celmer L 12/14/2014, 2:16 PM

## 2014-12-14 NOTE — Progress Notes (Signed)
[facility] MD Progress Note  12/14/2014 2:42 PM Cody [name]  MRN:  [id number] Subjective: Patient states " I am OK. "  Objective: Patient is a 58 y old AAM , who has a hx of schizoaffective disorder , who presented to [facility] on 12/03/14 - brought in as [facility] - after bystanders called them after finding patient unresponsive. Patient was medically cleared in ED - had CT scan head done (unremarkable) , EEG done - no seizure like activity found, UDS - positive for THC.  Pt was observed to be non communicative in ED, also appeared to be aggressive requiring chemical as well as physical restraints. Pt was placed on [facility] list for higher level of care - however was transferred to [facility] for psychiatric care .  Pt seen this AM. Pt has been showing progress since admission. Pt appears to be more interactive, seen as answering questions appropriately . Pt reports AH as lower, less distressing. Pt denies any vh/si/hi. Pt denies any sleep issues .Pt reports feeling tired in the AM - hence discussed changing the Risperidone dose in the AM. Pt as well as Probation officer spoke to mother on speaker phone - pt was able to communicate with his mother in a calm manner . Will take patient off of 1:1 - observe patient on the unit . Pt to be encouraged to attend groups .     Principal Problem: Schizoaffective disorder, bipolar type Diagnosis:   Patient Active Problem List   Diagnosis Date Noted  . Cannabis use disorder, severe, dependence [F12.20] 12/11/2014  . Schizoaffective disorder, bipolar type [F25.0]    Total Time spent with patient: 30 minutes   Past Medical History:  Past Medical History  Diagnosis Date  . Schizophrenia   . Anxiety    History reviewed. No pertinent past surgical history. Family History: patient did not express any family hx. Social History:  History  Alcohol Use No     History  Drug Use  . Yes  . Special: Marijuana    History   Social History  . Marital Status: Single   Spouse Name: N/A  . Number of Children: N/A  . Years of Education: N/A   Social History Main Topics  . Smoking status: Smoker, Current Status Unknown  . Smokeless tobacco: Never Used  . Alcohol Use: No  . Drug Use: Yes    Special: Marijuana  . Sexual Activity: Yes   Other Topics Concern  . None   Social History Narrative   Additional History:    Sleep: Fair  Appetite:  Fair    Musculoskeletal: Strength & Muscle Tone: within normal limits Gait & Station: normal Patient leans: N/A   Psychiatric Specialty Exam: Physical Exam  Review of Systems  Psychiatric/Behavioral: Positive for hallucinations. The patient is nervous/anxious.   All other systems reviewed and are negative.   Blood pressure 108/60, pulse 108, temperature 99.5 F (37.5 C), temperature source Oral, resp. rate 16, height 5' 7"  (1.702 m), weight 73.936 kg (163 lb), SpO2 100 %.Body mass index is 25.52 kg/(m^2).  General Appearance: Fairly Groomed  Engineer, water::  Fair  Speech:  Slow  Volume:  Decreased  Mood:  Anxious improving  Affect:  Congruent  Thought Process:  Goal Directed  Orientation:  Full (Time, Place, and Person)  Thought Content:  Hallucinations: Auditory and Paranoid Ideation improving  Suicidal Thoughts:  No  Homicidal Thoughts:  No  Memory:  Immediate;   Fair Recent;   Fair Remote;   Fair  Judgement:  Impaired  Insight:  Shallow  Psychomotor Activity:  Decreased  Concentration:  Fair  Recall:  [place]: Fair  Akathisia:  No  Handed:  Right  AIMS (if indicated):     Assets:  Physical Health Social Support  ADL's:  Intact  Cognition: WNL  Sleep:  Number of Hours: 6.75     Current Medications: Current Facility-Administered Medications  Medication Dose Route Frequency Provider Last Rate Last Dose  . acetaminophen (TYLENOL) tablet 650 mg  650 mg Oral Q6H PRN Niel Hummer, NP   650 mg at 12/14/14 1211  . alum & mag hydroxide-simeth  (MAALOX/MYLANTA) 200-200-20 MG/5ML suspension 30 mL  30 mL Oral Q4H PRN Niel Hummer, NP      . Derrill Memo ON 12/15/2014] benztropine (COGENTIN) tablet 0.5 mg  0.5 mg Oral Daily Rickayla Wieland, MD      . benztropine (COGENTIN) tablet 0.5 mg  0.5 mg Oral QHS Kamry Faraci, MD      . divalproex (DEPAKOTE) DR tablet 500 mg  500 mg Oral Q12H Krystalyn Kubota, MD   500 mg at 12/14/14 0917  . hydrOXYzine (ATARAX/VISTARIL) tablet 25 mg  25 mg Oral Q6H PRN Niel Hummer, NP   25 mg at 12/13/14 2118  . LORazepam (ATIVAN) tablet 0.5 mg  0.5 mg Oral Daily Ursula Alert, MD   0.5 mg at 12/14/14 0917  . magnesium hydroxide (MILK OF MAGNESIA) suspension 30 mL  30 mL Oral Daily PRN Niel Hummer, NP      . nicotine (NICODERM CQ - dosed in mg/24 hours) patch 21 mg  21 mg Transdermal Daily Ursula Alert, MD   21 mg at 12/13/14 0825  . OLANZapine zydis (ZYPREXA) disintegrating tablet 10 mg  10 mg Oral Q8H PRN Ursula Alert, MD   10 mg at 12/13/14 0216   Or  . OLANZapine (ZYPREXA) injection 10 mg  10 mg Intramuscular Q8H PRN Ursula Alert, MD      . Derrill Memo ON 12/15/2014] risperiDONE (RISPERDAL) tablet 2 mg  2 mg Oral Daily Shatisha Falter, MD      . risperiDONE (RISPERDAL) tablet 3 mg  3 mg Oral QHS Tyleigh Mahn, MD      . traZODone (DESYREL) tablet 50 mg  50 mg Oral QHS PRN Niel Hummer, NP   50 mg at 12/13/14 2118    Lab Results:  Results for orders placed or performed during the hospital encounter of 12/10/14 (from the past 48 hour(s))  Comprehensive metabolic panel     Status: Abnormal   Collection Time: 12/12/14  7:17 PM  Result Value Ref Range   Sodium 139 135 - 145 mmol/L   Potassium 3.9 3.5 - 5.1 mmol/L   Chloride 104 101 - 111 mmol/L   CO2 26 22 - 32 mmol/L   Glucose, Bld 118 (H) 65 - 99 mg/dL   BUN 12 6 - 20 mg/dL   Creatinine, Ser 0.99 0.61 - 1.24 mg/dL   Calcium 9.2 8.9 - 10.3 mg/dL   Total Protein 6.7 6.5 - 8.1 g/dL   Albumin 4.2 3.5 - 5.0 g/dL   AST 21 15 - 41 U/L   ALT 14 (L) 17 - 63 U/L    Alkaline Phosphatase 52 38 - 126 U/L   Total Bilirubin 0.8 0.3 - 1.2 mg/dL   GFR calc non Af Amer >60 >60 mL/min   GFR calc Af Amer >60 >60 mL/min    Comment: (NOTE) The eGFR  has been calculated using the CKD EPI equation. This calculation has not been validated in all clinical situations. eGFR's persistently <60 mL/min signify possible Chronic Kidney Disease.    Anion gap 9 5 - 15    Comment: Performed at [facility]  Folate     Status: None   Collection Time: 12/12/14  7:17 PM  Result Value Ref Range   Folate 8.5 >5.9 ng/mL    Comment: Performed at [facility]  Hemoglobin A1c     Status: None   Collection Time: 12/12/14  7:17 PM  Result Value Ref Range   Hgb A1c MFr Bld 4.9 4.8 - 5.6 %    Comment: (NOTE)         Pre-diabetes: 5.7 - 6.4         Diabetes: >6.4         Glycemic control for adults with diabetes: <7.0    Mean Plasma Glucose 94 mg/dL    Comment: (NOTE) Performed At: [facility] [place], [place] [id number] Lindon Romp MD [id number] Performed at [facility]   Lipid panel     Status: None   Collection Time: 12/12/14  7:17 PM  Result Value Ref Range   Cholesterol 164 0 - 200 mg/dL   Triglycerides 134 <150 mg/dL   HDL 48 >40 mg/dL   Total CHOL/HDL Ratio 3.4 RATIO   VLDL 27 0 - 40 mg/dL   LDL Cholesterol 89 0 - 99 mg/dL    Comment:        Total Cholesterol/HDL:CHD Risk Coronary Heart Disease Risk Table                     Men   Women  1/2 Average Risk   3.4   3.3  Average Risk       5.0   4.4  2 X Average Risk   9.6   7.1  3 X Average Risk  23.4   11.0        Use the calculated Patient Ratio above and the CHD Risk Table to determine the patient's CHD Risk.        ATP III CLASSIFICATION (LDL):  <100     mg/dL   Optimal  100-129  mg/dL   Near or Above                    Optimal  130-159  mg/dL   Borderline  160-189  mg/dL   High  >190     mg/dL   Very High Performed at  [facility]   Prolactin     Status: Abnormal   Collection Time: 12/12/14  7:17 PM  Result Value Ref Range   Prolactin 76.6 (H) 4.0 - 15.2 ng/mL    Comment: (NOTE) Performed At: [facility] [place], [place] [id number] Lindon Romp MD [id number] Performed at [facility]   RPR     Status: None   Collection Time: 12/12/14  7:17 PM  Result Value Ref Range   RPR Ser Ql Non Reactive Non Reactive    Comment: (NOTE) Performed At: [facility] [place], [place] [id number] Lindon Romp MD [id number] Performed at [facility]   TSH     Status: None   Collection Time: 12/12/14  7:17 PM  Result Value Ref Range   TSH 0.535 0.350 - 4.500 uIU/mL  Comment: Performed at [facility]  Vitamin B12     Status: None   Collection Time: 12/12/14  7:17 PM  Result Value Ref Range   Vitamin B-12 626 180 - 914 pg/mL    Comment: (NOTE) This assay is not validated for testing neonatal or myeloproliferative syndrome specimens for Vitamin B12 levels. Performed at [facility]     Physical Findings: AIMS: Facial and Oral Movements Muscles of Facial Expression: None, normal Lips and Perioral Area: None, normal Jaw: None, normal Tongue: None, normal,Extremity Movements Upper (arms, wrists, hands, fingers): None, normal Lower (legs, knees, ankles, toes): None, normal, Trunk Movements Neck, shoulders, hips: None, normal, Overall Severity Severity of abnormal movements (highest score from questions above): None, normal Incapacitation due to abnormal movements: None, normal Patient's awareness of abnormal movements (rate only patient's report): No Awareness, Dental Status Current problems with teeth and/or dentures?: No Does patient usually wear dentures?: No  CIWA:    COWS:    Collateral obtained from mother - (12/11/14) Writer was able to contact Verdene Lennert [phone] - mother - per mother pt was not prescribed Risperidone when he went to [facility] for his follow up appointment after getting discharged from [facility] in may 2016. Pt thus stopped taking Risperidone and this could have resulted in his decompensation. Per mother pt acts this way , when he is not on medications . Per mother she does not think he is abusing any substances. Per mother pt goes to [employer] [facility] during day time. Mom is very involved in patient's care.       Assessment: Pt is a 54 y old AAM with hx of schizoaffective do , presented unresponsive to ED as Jenny Reichmann Doe , later identified as patient , had decompensated after he had stopped taking risperidone . Pt was mute on admission- all work up done  in ED were unremarkable. Pt continues to make progress - is more interactive , will discontinue 1:1 precaution. will continue treatment.    Treatment Plan Summary: Daily contact with patient to assess and evaluate symptoms and progress in treatment and Medication management Will continue Risperidone , but change dose to 2 mg po qam and 3 mg po qhs to help with drowsiness in the AM.Plan to DC patient on LAI. Will continue Cogentin 0.5 mg po bid for EPS. Will continue Depakote DR 500 mg po bid for mood lability. Depakote level - 12/15/14. Will continue Ativan 0.5 mg po , Reduce to once daily dose - x3 doses. Will make available Zyprexa 10 mg po/im q8h prn for severe anxiety/agitation. Dc 1:1 precaution.  Will continue to monitor vitals ,medication compliance and treatment side effects while patient is here.  Will monitor for medical issues as well as call consult as needed.  Reviewed labs - tsh, lipid, folate vitamin b12- wnl. CSW will start working on disposition.  Patient to participate in therapeutic milieu .    Medical Decision Making:  Review of Psycho-Social Stressors (1), Review or order clinical lab tests (1), Review of Last Therapy Session (1), Review of Medication  Regimen & Side Effects (2) and Review of New Medication or Change in Dosage (2)     Takina Busser md 12/14/2014, 2:42 PM

## 2014-12-14 NOTE — Progress Notes (Signed)
[facility] Post 1:1 Observation Documentation  For the first (8) hours following discontinuation of 1:1 precautions, a progress note entry by nursing staff should be documented at least every 2 hours, reflecting the patient's behavior, condition, mood, and conversation.  Use the progress notes for additional entries.  Time 1:1 discontinued:  1009  Patient's Behavior:  Calm and cooperative. Continues to isolate in room.  Patient's Condition:  Pt safety maintained. Staff will continue to monitor pt q 15 minutes  Patient's Conversation:   Thought blocking and delayed responses.  Yeraldine Forney L 12/14/2014, 1:54 PM

## 2014-12-14 NOTE — Progress Notes (Signed)
Nursing 1:1 note D:Pt observed sleeping in bed with eyes closed. RR even and unlabored. No distress noted. A: 1:1 observation continues for safety  R: pt remains safe  

## 2014-12-14 NOTE — Progress Notes (Signed)
Psychoeducational Group Note  Date:  12/14/2014 Time:  2243  Group Topic/Focus:  Wrap-Up Group:   The focus of this group is to help patients review their daily goal of treatment and discuss progress on daily workbooks.  Participation Level: Did Not Attend  Participation Quality:  Not Applicable  Affect:  Not Applicable  Cognitive:  Not Applicable  Insight:  Not Applicable  Engagement in Group: Not Applicable  Additional Comments:  The patient did not attend group this evening and remained in his bedroom.  Hazle CocaGOODMAN, Nyema Hachey S 12/14/2014, 10:43 PM

## 2014-12-14 NOTE — Progress Notes (Signed)
[facility] Post 1:1 Observation Documentation  For the first (8) hours following discontinuation of 1:1 precautions, a progress note entry by nursing staff should be documented at least every 2 hours, reflecting the patient's behavior, condition, mood, and conversation.  Use the progress notes for additional entries.  Time 1:1 discontinued:  1009  Patient's Behavior:  Pt asleep. Resp even and unlabored. Pt do not appear to be in distress.  Patient's Condition:  Pt safety maintained. 15 minute checks performed for safety.  Patient's Conversation:  UTA. Pt asleep.  Cody [name] L 12/14/2014, 7:15 PM

## 2014-12-14 NOTE — Progress Notes (Signed)
[facility] Post 1:1 Observation Documentation  For the first (8) hours following discontinuation of 1:1 precautions, a progress note entry by nursing staff should be documented at least every 2 hours, reflecting the patient's behavior, condition, mood, and conversation.  Use the progress notes for additional entries.  Time 1:1 discontinued:  1009  Patient's Behavior:  Pt calm and cooperative at this time. Pt attended group outdoors as scheduled and was noted to be appropriate by MHT.  Patient's Condition:  Pt safety maintained. 15 minute checks performed for safety.  Patient's Conversation:  Delayed responses.  Cody [name] L 12/14/2014, 4:39 PM

## 2014-12-14 NOTE — [facility] Group Notes (Signed)
[facility] LCSW Group Therapy 12/14/2014 1:15 PM  Type of Therapy: Group Therapy  Participation Level: Invited, chose not to attend.  Modes of Intervention: Clarification, Confrontation, Discussion, Education, Exploration, Limit-setting, Orientation, Problem-solving, Rapport Building, Dance movement psychotherapisteality Testing, Socialization and Support  Summary of Progress/Problems: The topic for today was feelings about relapse. Pt discussed what relapse prevention is to them and identified triggers that they are on the path to relapse. Pt processed their feeling towards relapse and was able to relate to peers. Pt discussed coping skills that can be used for relapse prevention.   Santa GeneraAnne Leelynd Maldonado, LCSW Clinical Social Worker

## 2014-12-14 NOTE — Progress Notes (Signed)
Pt woke up paranoid speaking disorganized. "I need to sleep in the quiet room , because I was sleep then I was awake and was not supposed to be awake"

## 2014-12-14 NOTE — [facility] Group Notes (Signed)
[facility]   12/14/2014 11:27 AM  Participation Quality:  Invited.  Chose to not attend   Kiribatiorth, Thereasa Distanceodney B

## 2014-12-14 NOTE — Plan of Care (Signed)
Problem: Ineffective individual coping Goal: STG: Patient will remain free from self harm Outcome: Progressing Pt safe on the unit     

## 2014-12-14 NOTE — Tx Team (Signed)
Interdisciplinary Treatment Plan Update (Adult)  Date:  12/14/2014   Time Reviewed:  11:21 AM   Progress in Treatment: Attending groups: No Participating in groups:  No Taking medication as prescribed:  Yes. Tolerating medication:  Yes. Family/Significant othe contact made:  No Patient understands diagnosis:  Yes   Discussing patient identified problems/goals with staff:  Yes, see initial care plan. Medical problems stabilized or resolved:  Yes. Denies suicidal/homicidal ideation: Yes. Issues/concerns per patient self-inventory:  No. Other:  New problem(s) identified:  Discharge Plan or Barriers:  Return home, follow up outpt  Reason for Continuation of Hospitalization: Hallucinations Medication stabilization Other; describe Paranoia, disorganization  Comments: Pt seen this AM. Pt continues to be a poor historian , very minimal interaction. Pt answers questions in monosyllables . Pt denies any concerns today - reports AH as better. Pt denies any ADRS of medications. Per staff - pt endorses HI towards room mate - off note: pt has no room mate - unknown if he meant his sitter .   Is taking all meds as prescribed.  1:1 discontinued today   Estimated length of stay:  New goal(s):  Review of initial/current patient goals per problem list:     Attendees: Patient:  12/14/2014 11:21 AM   Family:   12/14/2014 11:21 AM   Physician:  Jomarie LongsSaramma Eappen, MD 12/14/2014 11:21 AM   Nursing:   Liborio NixonPatrice White, RN 12/14/2014 11:21 AM   CSW:    Daryel Geraldodney Anzleigh Slaven, LCSW   12/14/2014 11:21 AM   Other:  12/14/2014 11:21 AM   Other:   12/14/2014 11:21 AM   Other:  Onnie BoerJennifer Clark, Nurse CM 12/14/2014 11:21 AM   Other:  Leisa LenzValerie Enoch, [facility] 12/14/2014 11:21 AM   Other:  Tomasita Morrowelora Sutton, P4CC  12/14/2014 11:21 AM   Other:  12/14/2014 11:21 AM   Other:  12/14/2014 11:21 AM   Other:  12/14/2014 11:21 AM   Other:  12/14/2014 11:21 AM   Other:  12/14/2014 11:21 AM   Other:   12/14/2014 11:21 AM    Scribe for Treatment Team:    Ida RogueNorth, Tamyra Fojtik B, 12/14/2014 11:21 AM

## 2014-12-14 NOTE — Progress Notes (Signed)
[facility] Post 1:1 Observation Documentation  For the first (8) hours following discontinuation of 1:1 precautions, a progress note entry by nursing staff should be documented at least every 2 hours, reflecting the patient's behavior, condition, mood, and conversation.  Use the progress notes for additional entries.  Time 1:1 discontinued:  1009  Patient's Behavior:  Pt calm and cooperative this morning. No inappropriate behaviors this morning.  Patient's Condition:  Pt safety maintained. Pt will be monitored every 15 minutes by staff.   Patient's Conversation:  Pt responses are delayed. Thoughts are disorganized.    Cody [name] L 12/14/2014, 10:14 AM

## 2014-12-15 LAB — CK: Total CK: 116 U/L (ref 49–397)

## 2014-12-15 LAB — VALPROIC ACID LEVEL: Valproic Acid Lvl: 57 ug/mL (ref 50.0–100.0)

## 2014-12-15 MED ORDER — BENZTROPINE MESYLATE 1 MG PO TABS
1.0000 mg | ORAL_TABLET | Freq: Every day | ORAL | Status: DC
  Administered 2014-12-16 – 2014-12-19 (×4): 1 mg via ORAL
  Filled 2014-12-15 (×7): qty 1

## 2014-12-15 MED ORDER — RISPERIDONE 2 MG PO TABS
2.0000 mg | ORAL_TABLET | Freq: Every day | ORAL | Status: DC
  Administered 2014-12-15 – 2014-12-18 (×4): 2 mg via ORAL
  Filled 2014-12-15 (×6): qty 1

## 2014-12-15 NOTE — Progress Notes (Signed)
Psychoeducational Group Note  Date:  01/23/2012 Time: 1030  Group Topic/Focus:  Identifying Needs:   The focus of this group is to help patients identify their personal needs that have been historically problematic and identify healthy behaviors to address their needs.  Participation Level:  Patient did not attend.  Participation Quality: N/A  Affect: N/A  Cognitive:  N/A  Insight:  N/A  Engagement in Group: N/A   Additional Comments:

## 2014-12-15 NOTE — Progress Notes (Signed)
D: Pt denies SI/HI/AVH. Pt is pleasant and cooperative. Pt continues to be paranoid and guarded, but pt affect is brighter. Pt facial expressions are consistent with his mood (pt. Smiling, looking sad, etc...) pt not keeping flat blunted expression  A: Pt was offered support and encouragement. Pt was given scheduled medications. Pt was encourage to attend groups. Q 15 minute checks were done for safety.   R:Pt attends groups and interacts well with peers and staff. Pt is taking medication. Pt has no complaints at this time .Pt receptive to treatment and safety maintained on unit.

## 2014-12-15 NOTE — Progress Notes (Signed)
Patient ID: Cody [name], male   DOB: January 03, 1991, 24 y.o.   MRN: [id number] [facility] MD Progress Note  12/15/2014 11:18 AM Cody [name]  MRN:  [id number] Subjective: Patient states "I was hearing voices. The voices are still going on. They say things like get out of my face. There may be more than one but I am not sure."   Objective: Patient is a 24 y old AAM , who has a hx of schizoaffective disorder , who presented to [facility] on 12/03/14 - brought in as Cody [name] [facility] - after bystanders called them after finding patient unresponsive. Patient was medically cleared in ED - had CT scan head done (unremarkable) , EEG done - no seizure like activity found, UDS - positive for THC.  Pt was observed to be non communicative in ED, also appeared to be aggressive requiring chemical as well as physical restraints. Pt was placed on [facility] list for higher level of care - however was transferred to [facility] for psychiatric care .Pt has been showing progress since admission. Pt appears to be more interactive, seen as answering questions appropriately . Pt reports AH as lower, less distressing. Pt denies any vh/si/hi. Pt denies any sleep issues .Pt reported feeling tired in the morning and his Risperdal was adjusted. Pt to be encouraged to attend groups. Nursing reports that the pt continues to have some psychotic symptoms such as paranoia and has been observed to have disorganized thought processes. This morning the pt was noted to be under the covers and was slow to respond to prompts to make eye contact with Clinical research associate.   Principal Problem: Schizoaffective disorder, bipolar type Diagnosis:   Patient Active Problem List   Diagnosis Date Noted  . Cannabis use disorder, severe, dependence [F12.20] 12/11/2014  . Schizoaffective disorder, bipolar type [F25.0]    Total Time spent with patient: 20 minutes  Past Medical History:  Past Medical History  Diagnosis Date  . Schizophrenia   . Anxiety    History reviewed. No pertinent  past surgical history. Family History: patient did not express any family hx. Social History:  History  Alcohol Use No     History  Drug Use  . Yes  . Special: Marijuana    History   Social History  . Marital Status: Single    Spouse Name: N/A  . Number of Children: N/A  . Years of Education: N/A   Social History Main Topics  . Smoking status: Smoker, Current Status Unknown  . Smokeless tobacco: Never Used  . Alcohol Use: No  . Drug Use: Yes    Special: Marijuana  . Sexual Activity: Yes   Other Topics Concern  . None   Social History Narrative   Additional History:    Sleep: Fair  Appetite:  Fair  Musculoskeletal: Strength & Muscle Tone: within normal limits Gait & Station: normal Patient leans: N/A  Psychiatric Specialty Exam: Physical Exam  Review of Systems  Constitutional: Negative for fever, chills, weight loss and malaise/fatigue.  HENT: Negative for congestion, ear discharge, ear pain, hearing loss, nosebleeds and tinnitus.   Eyes: Negative for blurred vision, double vision, photophobia, pain and discharge.  Respiratory: Negative for cough, hemoptysis, sputum production and shortness of breath.   Cardiovascular: Negative for chest pain, palpitations, orthopnea, claudication and leg swelling.  Gastrointestinal: Negative for heartburn, nausea, vomiting, abdominal pain, diarrhea and constipation.  Genitourinary: Negative for dysuria, urgency, frequency and hematuria.  Musculoskeletal: Negative for myalgias and neck pain.  Skin: Negative for itching  and rash.  Neurological: Negative for dizziness, tingling, tremors, sensory change, focal weakness and headaches.  Endo/Heme/Allergies: Negative for environmental allergies. Does not bruise/bleed easily.  Psychiatric/Behavioral: Positive for depression, hallucinations and substance abuse (Positive for marijuana ). The patient is nervous/anxious.   All other systems reviewed and are negative.   Blood pressure  108/60, pulse 108, temperature 99.5 F (37.5 C), temperature source Oral, resp. rate 16, height 5\' 7"  (1.702 m), weight 73.936 kg (163 lb), SpO2 100 %.Body mass index is 25.52 kg/(m^2).  General Appearance: Fairly Groomed  Patent attorneyye Contact::  Fair  Speech:  Slow  Volume:  Decreased  Mood:  Anxious improving  Affect:  Congruent  Thought Process:  Goal Directed  Orientation:  Full (Time, Place, and Person)  Thought Content:  Hallucinations: Auditory and Paranoid Ideation improving  Suicidal Thoughts:  No  Homicidal Thoughts:  No  Memory:  Immediate;   Fair Recent;   Fair Remote;   Fair  Judgement:  Impaired  Insight:  Shallow  Psychomotor Activity:  Decreased  Concentration:  Fair  Recall:  [employer]  Fund of Knowledge:Fair  Language: Fair  Akathisia:  No  Handed:  Right  AIMS (if indicated):     Assets:  Physical Health Social Support  ADL's:  Intact  Cognition: WNL  Sleep:  Number of Hours: 6.25     Current Medications: Current Facility-Administered Medications  Medication Dose Route Frequency Provider Last Rate Last Dose  . acetaminophen (TYLENOL) tablet 650 mg  650 mg Oral Q6H PRN Thermon LeylandLaura A Norfleet Capers, NP   650 mg at 12/14/14 1211  . alum & mag hydroxide-simeth (MAALOX/MYLANTA) 200-200-20 MG/5ML suspension 30 mL  30 mL Oral Q4H PRN Thermon LeylandLaura A Shaquavia Whisonant, NP      . benztropine (COGENTIN) tablet 0.5 mg  0.5 mg Oral Daily Jomarie LongsSaramma Eappen, MD   0.5 mg at 12/15/14 0854  . benztropine (COGENTIN) tablet 0.5 mg  0.5 mg Oral QHS Jomarie LongsSaramma Eappen, MD   0.5 mg at 12/14/14 2015  . divalproex (DEPAKOTE) DR tablet 500 mg  500 mg Oral Q12H Jomarie LongsSaramma Eappen, MD   500 mg at 12/15/14 0854  . hydrOXYzine (ATARAX/VISTARIL) tablet 25 mg  25 mg Oral Q6H PRN Thermon LeylandLaura A Patte Winkel, NP   25 mg at 12/14/14 2354  . LORazepam (ATIVAN) tablet 0.5 mg  0.5 mg Oral Daily Jomarie LongsSaramma Eappen, MD   0.5 mg at 12/15/14 0903  . magnesium hydroxide (MILK OF MAGNESIA) suspension 30 mL  30 mL Oral Daily PRN Thermon LeylandLaura A Mitsy Owen, NP      . nicotine (NICODERM  CQ - dosed in mg/24 hours) patch 21 mg  21 mg Transdermal Daily Jomarie LongsSaramma Eappen, MD   21 mg at 12/13/14 0825  . OLANZapine zydis (ZYPREXA) disintegrating tablet 10 mg  10 mg Oral Q8H PRN Jomarie LongsSaramma Eappen, MD   10 mg at 12/14/14 2354   Or  . OLANZapine (ZYPREXA) injection 10 mg  10 mg Intramuscular Q8H PRN Saramma Eappen, MD      . risperiDONE (RISPERDAL) tablet 2 mg  2 mg Oral Daily Jomarie LongsSaramma Eappen, MD   2 mg at 12/15/14 0854  . risperiDONE (RISPERDAL) tablet 3 mg  3 mg Oral QHS Jomarie LongsSaramma Eappen, MD   3 mg at 12/14/14 2015  . traZODone (DESYREL) tablet 50 mg  50 mg Oral QHS PRN Thermon LeylandLaura A Nasean Zapf, NP   50 mg at 12/14/14 2354    Lab Results:  No results found for this or any previous visit (from the past 48 hour(s)).  Physical Findings: AIMS: Facial and Oral Movements Muscles of Facial Expression: None, normal Lips and Perioral Area: None, normal Jaw: None, normal Tongue: None, normal,Extremity Movements Upper (arms, wrists, hands, fingers): None, normal Lower (legs, knees, ankles, toes): None, normal, Trunk Movements Neck, shoulders, hips: None, normal, Overall Severity Severity of abnormal movements (highest score from questions above): None, normal Incapacitation due to abnormal movements: None, normal Patient's awareness of abnormal movements (rate only patient's report): No Awareness, Dental Status Current problems with teeth and/or dentures?: No Does patient usually wear dentures?: No  CIWA:    COWS:    Collateral obtained from mother - (12/11/14) Writer was able to contact Kelton Pillar [phone] - mother - per mother pt was not prescribed Risperidone when he went to [facility] for his follow up appointment after getting discharged from [facility] in may 2016. Pt thus stopped taking Risperidone and this could have resulted in his decompensation. Per mother pt acts this way , when he is not on medications . Per mother she does not think he is abusing any substances. Per mother pt goes to [employer] [facility] during day time. Mom is very involved in patient's care.  Assessment: Pt is a 80 y old AAM with hx of schizoaffective do , presented unresponsive to ED as Cody [name] , later identified as patient , had decompensated after he had stopped taking risperidone . Pt was mute on admission- all work up done  in ED were unremarkable. Pt continues to make progress - is more interactive. will continue treatment.  Treatment Plan Summary: Daily contact with patient to assess and evaluate symptoms and progress in treatment and Medication management Will continue Risperidone , but change dose to 2 mg po qam and 3 mg po qhs to help with drowsiness in the AM. Plan to DC patient on LAI to improve compliance.  Will continue Cogentin 0.5 mg po bid for EPS. Will continue Depakote DR 500 mg po bid for mood lability. Depakote level - 12/15/14. Will continue Ativan 0.5 mg po , Reduce to once daily dose - x3 doses. Will make available Zyprexa 10 mg po/im q8h prn for severe anxiety/agitation. Will continue to monitor vitals ,medication compliance and treatment side effects while patient is here.  Will monitor for medical issues as well as call consult as needed.  Reviewed labs - tsh, lipid, folate vitamin b12- wnl. CSW will start working on disposition.  Patient to participate in therapeutic milieu .   Medical Decision Making:  Review of Psycho-Social Stressors (1), Review or order clinical lab tests (1), Review of Last Therapy Session (1), Review of Medication Regimen & Side Effects (2) and Review of New Medication or Change in Dosage (2)  Taisia Fantini, NP-C 12/15/2014, 11:18 AM

## 2014-12-15 NOTE — Plan of Care (Signed)
Problem: Alteration in thought process Goal: LTG-Patient behavior demonstrates decreased signs psychosis Pt is off of 1:1 today. Still presents with paranoia, disorganization. Goal not met, but progressing. R North LCSW 12/14/2014 11:26 AM  Outcome: Progressing Pt appears not to be responding, but is believed to still hear voices. Pt is having less thought blocking with conversations.  Goal: STG-Patient is able to discuss thoughts with staff Outcome: Progressing Pt is talking more with staff, pt still has few words, but pt continues to improve with telling what is going on with him  Problem: Ineffective individual coping Goal: STG: Patient will remain free from self harm Outcome: Progressing Pt safe on the unit

## 2014-12-15 NOTE — Progress Notes (Signed)
Patient ID: Cody [name], male   DOB: Oct 31, 1990, 24 y.o.   MRN: [id number]  [facility] MD Progress Note  12/15/2014 5:11 PM Cody [name]  MRN:  [id number] Subjective: Patient states "I was hearing voices. The voices are still going on. They say things like get out of my face. There may be more than one but I am not sure."   Objective: Patient is a 24 y old AAM , who has a hx of schizoaffective disorder , who presented to [facility] on 12/03/14 - brought in as Cody [name] [facility] EMS - after bystanders called them after finding patient unresponsive. Patient was medically cleared in ED - had CT scan head done (unremarkable) , EEG done - no seizure like activity found, UDS - positive for THC.  Pt was observed to be non communicative in ED, also appeared to be aggressive requiring chemical as well as physical restraints. Pt was placed on [facility] list for higher level of care - however was transferred to [facility] for psychiatric care .Pt has been showing progress since admission. Pt appears to be more interactive, seen as answering questions appropriately . Pt reports AH as lower, less distressing. Pt denies any vh/si/hi. Pt denies any sleep issues .Pt reported feeling tired in the morning and his Risperdal was adjusted. Pt to be encouraged to attend groups. Nursing reports that the pt continues to have some psychotic symptoms such as paranoia and has been observed to have disorganized thought processes. This morning the pt was noted to be under the covers and was slow to respond to prompts to make eye contact with Clinical research associate. He later reported behaving this way because he was "afraid" of the voices that he was hearing. Pt was receptive to taking a prn dose of zyprexa zydis. Nursing staff report that the patient has been less active and interactive on the unit today. He is is noted to have a low grade fever and have some muscle rigidity in his arms. He is able to answer mental status questions and most recent set of vitals on 12/13/14 appear  stable.   Principal Problem: Schizoaffective disorder, bipolar type Diagnosis:   Patient Active Problem List   Diagnosis Date Noted  . Cannabis use disorder, severe, dependence [F12.20] 12/11/2014  . Schizoaffective disorder, bipolar type [F25.0]    Total Time spent with patient: 20 minutes  Past Medical History:  Past Medical History  Diagnosis Date  . Schizophrenia   . Anxiety    History reviewed. No pertinent past surgical history. Family History: patient did not express any family hx. Social History:  History  Alcohol Use No     History  Drug Use  . Yes  . Special: Marijuana    History   Social History  . Marital Status: Single    Spouse Name: N/A  . Number of Children: N/A  . Years of Education: N/A   Social History Main Topics  . Smoking status: Smoker, Current Status Unknown  . Smokeless tobacco: Never Used  . Alcohol Use: No  . Drug Use: Yes    Special: Marijuana  . Sexual Activity: Yes   Other Topics Concern  . None   Social History Narrative   Additional History:    Sleep: Fair  Appetite:  Fair  Musculoskeletal: Strength & Muscle Tone: within normal limits Gait & Station: normal Patient leans: N/A  Psychiatric Specialty Exam: Physical Exam  Review of Systems  Constitutional: Negative for fever, chills, weight loss and malaise/fatigue.  HENT: Negative for congestion,  ear discharge, ear pain, hearing loss, nosebleeds and tinnitus.   Eyes: Negative for blurred vision, double vision, photophobia, pain and discharge.  Respiratory: Negative for cough, hemoptysis, sputum production and shortness of breath.   Cardiovascular: Negative for chest pain, palpitations, orthopnea, claudication and leg swelling.  Gastrointestinal: Negative for heartburn, nausea, vomiting, abdominal pain, diarrhea and constipation.  Genitourinary: Negative for dysuria, urgency, frequency and hematuria.  Musculoskeletal: Negative for myalgias and neck pain.  Skin:  Negative for itching and rash.  Neurological: Negative for dizziness, tingling, tremors, sensory change, focal weakness and headaches.  Endo/Heme/Allergies: Negative for environmental allergies. Does not bruise/bleed easily.  Psychiatric/Behavioral: Positive for depression, hallucinations and substance abuse (Positive for marijuana ). The patient is nervous/anxious.   All other systems reviewed and are negative.   Blood pressure 108/60, pulse 108, temperature 99.5 F (37.5 C), temperature source Oral, resp. rate 16, height 5\' 7"  (1.702 m), weight 73.936 kg (163 lb), SpO2 100 %.Body mass index is 25.52 kg/(m^2).  General Appearance: Fairly Groomed  Patent attorneyye Contact::  Fair  Speech:  Slow  Volume:  Decreased  Mood:  Anxious improving  Affect:  Congruent  Thought Process:  Goal Directed  Orientation:  Full (Time, Place, and Person)  Thought Content:  Hallucinations: Auditory and Paranoid Ideation improving  Suicidal Thoughts:  No  Homicidal Thoughts:  No  Memory:  Immediate;   Fair Recent;   Fair Remote;   Fair  Judgement:  Impaired  Insight:  Shallow  Psychomotor Activity:  Decreased  Concentration:  Fair  Recall:  [employer]Fair  Fund of Knowledge:Fair  Language: Fair  Akathisia:  No  Handed:  Right  AIMS (if indicated):     Assets:  Physical Health Social Support  ADL's:  Intact  Cognition: WNL  Sleep:  Number of Hours: 6.25     Current Medications: Current Facility-Administered Medications  Medication Dose Route Frequency Provider Last Rate Last Dose  . acetaminophen (TYLENOL) tablet 650 mg  650 mg Oral Q6H PRN Thermon LeylandLaura A Davis, NP   650 mg at 12/15/14 1332  . alum & mag hydroxide-simeth (MAALOX/MYLANTA) 200-200-20 MG/5ML suspension 30 mL  30 mL Oral Q4H PRN Thermon LeylandLaura A Davis, NP      . benztropine (COGENTIN) tablet 0.5 mg  0.5 mg Oral QHS Jomarie LongsSaramma Eappen, MD   0.5 mg at 12/14/14 2015  . [START ON 12/16/2014] benztropine (COGENTIN) tablet 1 mg  1 mg Oral Daily Thermon LeylandLaura A Davis, NP      .  divalproex (DEPAKOTE) DR tablet 500 mg  500 mg Oral Q12H Jomarie LongsSaramma Eappen, MD   500 mg at 12/15/14 0854  . hydrOXYzine (ATARAX/VISTARIL) tablet 25 mg  25 mg Oral Q6H PRN Thermon LeylandLaura A Davis, NP   25 mg at 12/14/14 2354  . LORazepam (ATIVAN) tablet 0.5 mg  0.5 mg Oral Daily Jomarie LongsSaramma Eappen, MD   0.5 mg at 12/15/14 0903  . magnesium hydroxide (MILK OF MAGNESIA) suspension 30 mL  30 mL Oral Daily PRN Thermon LeylandLaura A Davis, NP      . nicotine (NICODERM CQ - dosed in mg/24 hours) patch 21 mg  21 mg Transdermal Daily Jomarie LongsSaramma Eappen, MD   21 mg at 12/13/14 0825  . OLANZapine zydis (ZYPREXA) disintegrating tablet 10 mg  10 mg Oral Q8H PRN Jomarie LongsSaramma Eappen, MD   10 mg at 12/14/14 2354   Or  . OLANZapine (ZYPREXA) injection 10 mg  10 mg Intramuscular Q8H PRN Saramma Eappen, MD      . risperiDONE (RISPERDAL) tablet 2 mg  2 mg Oral Daily Jomarie Longs, MD   2 mg at 12/15/14 0854  . risperiDONE (RISPERDAL) tablet 2 mg  2 mg Oral QHS Thermon Leyland, NP      . traZODone (DESYREL) tablet 50 mg  50 mg Oral QHS PRN Thermon Leyland, NP   50 mg at 12/14/14 2354    Lab Results:  No results found for this or any previous visit (from the past 48 hour(s)).  Physical Findings: AIMS: Facial and Oral Movements Muscles of Facial Expression: None, normal Lips and Perioral Area: None, normal Jaw: None, normal Tongue: None, normal,Extremity Movements Upper (arms, wrists, hands, fingers): None, normal Lower (legs, knees, ankles, toes): None, normal, Trunk Movements Neck, shoulders, hips: None, normal, Overall Severity Severity of abnormal movements (highest score from questions above): None, normal Incapacitation due to abnormal movements: None, normal Patient's awareness of abnormal movements (rate only patient's report): No Awareness, Dental Status Current problems with teeth and/or dentures?: No Does patient usually wear dentures?: No  CIWA:    COWS:    Collateral obtained from mother - (12/11/14) Writer was able to contact Kelton Pillar [phone] - mother - per mother pt was not prescribed Risperidone when he went to [facility] for his follow up appointment after getting discharged from [facility] in may 2016. Pt thus stopped taking Risperidone and this could have resulted in his decompensation. Per mother pt acts this way , when he is not on medications . Per mother she does not think he is abusing any substances. Per mother pt goes to [employer] [facility] during day time. Mom is very involved in patient's care.  Assessment: Pt is a 36 y old AAM with hx of schizoaffective do , presented unresponsive to ED as Cody [name] , later identified as patient , had decompensated after he had stopped taking risperidone . Pt was mute on admission- all work up done  in ED were unremarkable. Pt continues to make progress - is more interactive. will continue treatment.  Treatment Plan Summary: Daily contact with patient to assess and evaluate symptoms and progress in treatment and Medication management Will continue Risperidone , but decrease dose to 2 mg po qam and 2 mg po qhs due to concern about serious reactions to recent increase in dose to include hyperthermia and EPS. Plan to DC patient on LAI to improve compliance.  Will increase Cogentin to 1 mg in the morning and 0.5 mg in the evening for EPS. Will continue Depakote DR 500 mg po bid for mood lability. Depakote level - 12/15/14. Will continue Ativan 0.5 mg po , Reduce to once daily dose - x3 doses. Will make available Zyprexa 10 mg po/im q8h prn for severe anxiety/agitation. Will continue to monitor vitals ,medication compliance and treatment side effects while patient is here.  Will monitor for medical issues as well as call consult as needed.  Will obtain CK level to rule out Neuroleptic Malignant Syndrome and monitor temperature for further increases.  CSW will start working on disposition.  Patient to participate in therapeutic milieu .   Medical Decision Making:  Review of  Psycho-Social Stressors (1), Review or order clinical lab tests (1), Review of Last Therapy Session (1), Review of Medication Regimen & Side Effects (2) and Review of New Medication or Change in Dosage (2)  DAVIS, LAURA, NP-C 12/15/2014, 5:11 PM I agree with assessment and plan Madie Reno A. Dub Mikes, M.D.

## 2014-12-15 NOTE — Progress Notes (Signed)
D) Pt has been in his room and has been quiet and withdrawn. After lunch, Pt was lying in his bed and on the 15 min checks Pt was just lying there. He was extremely slow to respond to questions. Did nod his head yes when asked if hearing voices. Affect was fearful and Pt admitted to feeling afraid due to the voices. Pt filled out his inventory rating his depression at an 8, his hopelessness at a 9 and his anxiety at a 2. Denies SI and HI. A) Pt given tylenol and Risperdal at 1342 for pain in his left arm and for the voices and fears. Given lunch and provided with a 1:1. Encouragement given.  R) Pt  Took the medication without problem and said that it helped him. Wanted to lie back down and rest.

## 2014-12-15 NOTE — [facility] Group Notes (Signed)
[facility] LCSW Group Therapy Note  12/15/2014 1:15 PM  Type of Therapy and Topic:  Group Therapy: Avoiding Self-Sabotaging and Enabling Behaviors  Participation Level:  Did Not Attend although group announced and pt was encouraged to attend by both CSW and RN     Carney Bernatherine C Harrill, LCSW

## 2014-12-15 NOTE — Progress Notes (Signed)
Psychoeducational Group Note  Date:  12/15/2014 Time:  2127  Group Topic/Focus:  Wrap-Up Group:   The focus of this group is to help patients review their daily goal of treatment and discuss progress on daily workbooks.  Participation Level: Did Not Attend  Participation Quality:  Not Applicable  Affect:  Not Applicable  Cognitive:  Not Applicable  Insight:  Not Applicable  Engagement in Group: Not Applicable  Additional Comments:  The patient did not attend group and remained in his bedroom.   Hazle CocaGOODMAN, Jobany Montellano S 12/15/2014, 9:27 PM

## 2014-12-16 MED ORDER — DIVALPROEX SODIUM 500 MG PO DR TAB
500.0000 mg | DELAYED_RELEASE_TABLET | Freq: Two times a day (BID) | ORAL | Status: DC
  Administered 2014-12-16 – 2014-12-19 (×6): 500 mg via ORAL
  Filled 2014-12-16 (×5): qty 1
  Filled 2014-12-16: qty 28
  Filled 2014-12-16 (×3): qty 1
  Filled 2014-12-16: qty 28

## 2014-12-16 NOTE — Progress Notes (Signed)
D: Pt denies SI/HI/AVH. Pt is pleasant and cooperative. Pt affect appropriate. Pt interacting with staff on the unit. Pt stated he was ready to go home. Pt was observed out on the unit a little longer than prior days.   A: Pt was offered support and encouragement. Pt was given scheduled medications. Pt was encourage to attend groups. Q 15 minute checks were done for safety.   R:Pt attends groups and interacts well  peers and staff. Pt is taking medication. Pt has no complaints.Pt receptive to treatment and safety maintained on unit.

## 2014-12-16 NOTE — Plan of Care (Signed)
Problem: Ineffective individual coping Goal: STG: Patient will remain free from self harm Outcome: Progressing Pt safe on the unit     

## 2014-12-16 NOTE — Progress Notes (Signed)
D) Pt has been attending the groups, reluctantly, and comes out of his room a bit more than yesterday. Interacts with staff and has been in the dayroom a bit more today. Pt denies SI and HI. States that he wants to draw and requested paper so that he could draw portraits. Rates his depression at a 4, hopelessness at a 4 and his anxiety at a 3. Denies SI and HI. Called his mother this afternoon leaving a message and requested that his mother come and visit him this evening. Hoping to leave tomorrow A) Given support, reassurance and praise. Encouraged to read instead of spending his day sleeping. Provided with a 1:1. Given encouragement. R) Pt denies SI and HI. Coming out of room and socializing a bit more and eye contact improving.

## 2014-12-16 NOTE — [facility] Group Notes (Signed)
[facility] LCSW Group Therapy  12/16/2014  1:15 PM  Type of Therapy:  Group Therapy  Participation Level:  Did Not Attend  Summary of Progress/Problems: Topic for today was thoughts and feelings regarding discharge. We discussed fears of upcoming changes including judegements, expectations and stigma of mental health issues. We then discussed supports: what constitutes a supportive framework, identification of supports and what to do when others are not supportive. Patient's then identified a specific coping tool to use when others are not available.   Carney Bernatherine C Harrill, LCSW

## 2014-12-16 NOTE — Progress Notes (Addendum)
[facility] MD Progress Note  12/16/2014 3:25 PM Cody [name]  MRN:  [id number]   Subjective: Patient states, "I feel better today.  Honestly, Im ready to get up and outta here."  Cody [name] is denying hearing voices today.  He is wanting to find out when he will be going home.  Appears present and engaged.  Making good eye contact.  No signs of thought blocking or internal stimuli.    Objective: Patient is a 88 y old AAM , who has a hx of schizoaffective disorder , who presented to [facility] on 12/03/14 - brought in as Cody [name] [facility] - after bystanders called them after finding patient unresponsive. Patient was medically cleared in ED - had CT scan head done (unremarkable) , EEG done - no seizure like activity found, UDS - positive for THC.  Pt was observed to be non communicative in ED, also appeared to be aggressive requiring chemical as well as physical restraints. Pt was placed on [facility] list for higher level of care - however was transferred to [facility] for psychiatric care .Pt has been showing progress since admission. Pt appears to be more interactive, seen as answering questions appropriately . Pt reports AH as lower, less distressing.  Pt denies any AVH/SI/HI. Pt denies any sleep issues.  Pt to be encouraged to attend groups.  He is tolerating meds well.  Reporting no adverse side effects.  Depakote WNL at 57.    Principal Problem: Schizoaffective disorder, bipolar type Diagnosis:   Patient Active Problem List   Diagnosis Date Noted  . Cannabis use disorder, severe, dependence [F12.20] 12/11/2014  . Schizoaffective disorder, bipolar type [F25.0]    Total Time spent with patient: 20 minutes  Past Medical History:  Past Medical History  Diagnosis Date  . Schizophrenia   . Anxiety    History reviewed. No pertinent past surgical history. Family History: patient did not express any family hx. Social History:  History  Alcohol Use No     History  Drug Use  . Yes  . Special: Marijuana    History    Social History  . Marital Status: Single    Spouse Name: N/A  . Number of Children: N/A  . Years of Education: N/A   Social History Main Topics  . Smoking status: Smoker, Current Status Unknown  . Smokeless tobacco: Never Used  . Alcohol Use: No  . Drug Use: Yes    Special: Marijuana  . Sexual Activity: Yes   Other Topics Concern  . None   Social History Narrative   Additional History:    Sleep: Fair  Appetite:  Fair  Musculoskeletal: Strength & Muscle Tone: within normal limits Gait & Station: normal Patient leans: N/A  Psychiatric Specialty Exam: Physical Exam  Vitals reviewed.   Review of Systems  Constitutional: Negative for fever, chills, weight loss and malaise/fatigue.  HENT: Negative for congestion, ear discharge, ear pain, hearing loss, nosebleeds and tinnitus.   Eyes: Negative for blurred vision, double vision, photophobia, pain and discharge.  Respiratory: Negative for cough, hemoptysis, sputum production and shortness of breath.   Cardiovascular: Negative for chest pain, palpitations, orthopnea, claudication and leg swelling.  Gastrointestinal: Negative for heartburn, nausea, vomiting, abdominal pain, diarrhea and constipation.  Genitourinary: Negative for dysuria, urgency, frequency and hematuria.  Musculoskeletal: Negative for myalgias and neck pain.  Skin: Negative for itching and rash.  Neurological: Negative for dizziness, tingling, tremors, sensory change, focal weakness and headaches.  Endo/Heme/Allergies: Negative for environmental allergies. Does not bruise/bleed  easily.  Psychiatric/Behavioral: Positive for depression, hallucinations and substance abuse (Positive for marijuana ). The patient is nervous/anxious.   All other systems reviewed and are negative.   Blood pressure 96/58, pulse 99, temperature 98.6 F (37 C), temperature source Oral, resp. rate 16, height  (1.702 m), weight 73.936 kg (163 lb), SpO2 100 %.Body mass index is  25.52 kg/(m^2).  General Appearance: Fairly Groomed  Patent attorney::  Fair  Speech:  Slow  Volume:  Decreased  Mood:  Anxious improving  Affect:  Congruent  Thought Process:  Goal Directed  Orientation:  Full (Time, Place, and Person)  Thought Content:  Hallucinations: Auditory and Paranoid Ideation improving  Suicidal Thoughts:  No  Homicidal Thoughts:  No  Memory:  Immediate;   Fair Recent;   Fair Remote;   Fair  Judgement:  Impaired  Insight:  Shallow  Psychomotor Activity:  Decreased  Concentration:  Fair  Recall:  [employer] of Knowledge:Fair  Language: Fair  Akathisia:  No  Handed:  Right  AIMS (if indicated):     Assets:  Physical Health Social Support  ADL's:  Intact  Cognition: WNL  Sleep:  Number of Hours: 6.25     Current Medications: Current Facility-Administered Medications  Medication Dose Route Frequency Provider Last Rate Last Dose  . acetaminophen (TYLENOL) tablet 650 mg  650 mg Oral Q6H PRN Thermon Leyland, NP   650 mg at 12/15/14 1332  . alum & mag hydroxide-simeth (MAALOX/MYLANTA) 200-200-20 MG/5ML suspension 30 mL  30 mL Oral Q4H PRN Thermon Leyland, NP      . benztropine (COGENTIN) tablet 0.5 mg  0.5 mg Oral QHS Jomarie Longs, MD   0.5 mg at 12/15/14 1955  . benztropine (COGENTIN) tablet 1 mg  1 mg Oral Daily Thermon Leyland, NP   1 mg at 12/16/14 0746  . divalproex (DEPAKOTE) DR tablet 500 mg  500 mg Oral Q12H Adonis Brook, NP      . hydrOXYzine (ATARAX/VISTARIL) tablet 25 mg  25 mg Oral Q6H PRN Thermon Leyland, NP   25 mg at 12/14/14 2354  . magnesium hydroxide (MILK OF MAGNESIA) suspension 30 mL  30 mL Oral Daily PRN Thermon Leyland, NP      . nicotine (NICODERM CQ - dosed in mg/24 hours) patch 21 mg  21 mg Transdermal Daily Jomarie Longs, MD   21 mg at 12/16/14 0746  . OLANZapine zydis (ZYPREXA) disintegrating tablet 10 mg  10 mg Oral Q8H PRN Jomarie Longs, MD   10 mg at 12/14/14 2354   Or  . OLANZapine (ZYPREXA) injection 10 mg  10 mg Intramuscular  Q8H PRN Saramma Eappen, MD      . risperiDONE (RISPERDAL) tablet 2 mg  2 mg Oral Daily Jomarie Longs, MD   2 mg at 12/16/14 0746  . risperiDONE (RISPERDAL) tablet 2 mg  2 mg Oral QHS Thermon Leyland, NP   2 mg at 12/15/14 1955  . traZODone (DESYREL) tablet 50 mg  50 mg Oral QHS PRN Thermon Leyland, NP   50 mg at 12/15/14 1955    Lab Results:  Results for orders placed or performed during the hospital encounter of 12/10/14 (from the past 48 hour(s))  Valproic acid level     Status: None   Collection Time: 12/15/14  7:32 PM  Result Value Ref Range   Valproic Acid Lvl 57 50.0 - 100.0 ug/mL    Comment: Performed at [facility]  CK  Status: None   Collection Time: 12/15/14  7:32 PM  Result Value Ref Range   Total CK 116 49 - 397 U/L    Comment: Performed at [facility]    Physical Findings: AIMS: Facial and Oral Movements Muscles of Facial Expression: None, normal Lips and Perioral Area: None, normal Jaw: None, normal Tongue: None, normal,Extremity Movements Upper (arms, wrists, hands, fingers): None, normal Lower (legs, knees, ankles, toes): None, normal, Trunk Movements Neck, shoulders, hips: None, normal, Overall Severity Severity of abnormal movements (highest score from questions above): None, normal Incapacitation due to abnormal movements: None, normal Patient's awareness of abnormal movements (rate only patient's report): No Awareness, Dental Status Current problems with teeth and/or dentures?: No Does patient usually wear dentures?: No  CIWA:    COWS:    Collateral obtained from mother - (12/11/14) Writer was able to contact Kelton Pillarracey Holt [phone]- [phone] - mother - per mother pt was not prescribed Risperidone when [facility]. Pt thus stopped taking Risperidone and this could have resulted in his decompensation. Per mother pt acts this way , when he is not on  medications . Per mother she does not think he is abusing any substances. Per mother [facility] during day time. Mom is very involved in patient's care.  Assessment: Pt is a [age] y old AAM with hx of schizoaffective do , presented unresponsive to ED as Cody RuizJohn [name] , later identified as patient , had decompensated after he had stopped taking risperidone . Pt was mute on admission- all work up done  in ED were unremarkable. Pt continues to make progress - is more interactive. will continue treatment.  Treatment Plan Summary: Daily contact with patient to assess and evaluate symptoms and progress in treatment and Medication management Will continue Risperidone , but decrease dose to 2 mg po qam and 2 mg po qhs due to concern about serious reactions to recent increase in dose to include hyperthermia and EPS. Plan to DC patient on LAI to improve compliance.  Will increase Cogentin to 1 mg in the morning and 0.5 mg in the evening for EPS. Will continue Depakote DR 500 mg po bid for mood lability. Depakote level results 57. Will continue Ativan 0.5 mg po , Reduce to once daily dose - x3 doses. Will make available Zyprexa 10 mg po/im q8h prn for severe anxiety/agitation. Will continue to monitor vitals ,medication compliance and treatment side effects while patient is here.  Will monitor for medical issues as well as call consult as needed.  Will obtain CK level to rule out Neuroleptic Malignant Syndrome and monitor temperature for further increases.  CSW will start working on disposition.  Patient to participate in therapeutic milieu .   Medical Decision Making:  Review of Psycho-Social Stressors (1), Review or order clinical lab tests (1), Review of Last Therapy Session (1), Review of Medication Regimen & Side Effects (2) and Review of New Medication or Change in Dosage (2)  Velna HatchetSheila May Agustin, AGNP-BC 12/16/2014, 3:25 PM I agree with assessment and plan Madie RenoIrving A. Dub MikesLugo, M.D.

## 2014-12-16 NOTE — Progress Notes (Signed)
[facility] Group Notes:  (Nursing/MHT/Case Management/Adjunct)  Date:  12/16/2014  Time:  8:39 PM  Type of Therapy:  Psychoeducational Skills  Participation Level:  Active  Participation Quality:  Appropriate  Affect:  Appropriate  Cognitive:  Appropriate  Insight:  Improving  Engagement in Group:  Improving  Modes of Intervention:  Education  Summary of Progress/Problems: Patient states that he had a pretty good day overall since he spent more time "listening" to both his peers as well as in group. As a theme for the day, his support system will be comprised of his parents.   Hazle CocaGOODMAN, Diamonte Stavely S 12/16/2014, 8:39 PM

## 2014-12-16 NOTE — [facility] Group Notes (Signed)
[facility] Group Notes:  (Nursing/MHT/Case Management/Adjunct)  Date:  12/16/2014  Time:  12:24 PM  Type of Therapy:  Psychoeducational Skills  Participation Level:  Active  Participation Quality:  Appropriate  Affect:  Appropriate  Cognitive:  Alert  Insight:  Appropriate  Engagement in Group:  Engaged  Modes of Intervention:  Education  Summary of Progress/Problems:  Rich BraveDuke, Madelyn Tlatelpa Lynn 12/16/2014, 12:24 PM

## 2014-12-16 NOTE — [facility] Group Notes (Signed)
[facility] Group Notes:  (Nursing/MHT/Case Management/Adjunct)  Date:  12/16/2014  Time:  10:49 AM  Type of Therapy:  Nurse Education  Participation Level:  Did Not Attend  Participation Quality:  N/A   Affect:  N/A  Cognitive:  N/A  Insight:  N/A  Engagement in Group: N/A   Modes of Intervention:  N/A  Summary of Progress/Problems:  Cody [name], Cody [name] 12/16/2014, 10:49 AM

## 2014-12-17 MED ORDER — TRAZODONE HCL 100 MG PO TABS
100.0000 mg | ORAL_TABLET | Freq: Every day | ORAL | Status: DC
  Administered 2014-12-17 – 2014-12-18 (×2): 100 mg via ORAL
  Filled 2014-12-17 (×2): qty 1
  Filled 2014-12-17: qty 14
  Filled 2014-12-17: qty 1
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
## 2014-12-17 NOTE — Progress Notes (Signed)
Adult Psychoeducational Group Note  Date:  12/17/2014 Time:  8:38 PM  Group Topic/Focus:  Wrap-Up Group:   The focus of this group is to help patients review their daily goal of treatment and discuss progress on daily workbooks.  Participation Level:  Active  Participation Quality:  Appropriate  Affect:  Appropriate  Cognitive:  Appropriate  Insight: Appropriate  Engagement in Group:  Engaged  Modes of Intervention:  Discussion  Additional Comments: The patient expressed that he attended group.The patient also said that his day went very well.  Octavio Mannshigpen, Pankaj Haack Lee 12/17/2014, 8:38 PM

## 2014-12-17 NOTE — [facility] Group Notes (Signed)
[facility] L[facility] Dis[facility] Planning Group Note   12/17/2014 4:15 PM  Participation Quality:  Engaged  Mood/Affect:  Flat  Depression Rating:  denies  Anxiety Rating:  denies  Thoughts of Suicide:  No Will you contract for safety?   NA  Current AVH:  denies  Plan for Discharge/Comments:  Stayed the entire time.  Reported he is doing well and is hoping to leave soon.  York SpanielSaid he would call his mother to see if she was coming to visit today so I could talk to her about his progress and the follow up plan.  Transportation Means: mother  Supports:  mother  Ida Rogueorth, Sondos Wolfman B

## 2014-12-17 NOTE — Progress Notes (Addendum)
Patient ID: Cody [name], male   DOB: [birth year], 24 y.o.   MRN: [id number]   Pt currently presents with a blunted affect and anxious behavior. Per self inventory, pt rates depression at a 8, hopelessness 4 and anxiety 5. Pt's daily goal is "working on skills" and they intend to do so by "learn." Pt reports poor sleep, good concentration, low energy and a fair appetite.    Pt provided with medications per providers orders. Pt's labs and vitals were monitored throughout the day. Pt supported emotionally and encouraged to express concerns and questions. Pt educated on medications. Pt attended lunch today, minor anxiety expressed to staff. Pt given a 1:1 about social anxiety, reinforced education given by reporting nurse.     Pt's safety ensured with 15 minute and environmental checks. Pt currently denies SI/HI and Visual hallucinations. Pt endorses auditory hallucinations and states "They aren't as loud, sleeping helps." Pt verbally agrees to seek staff if SI/HI or A/VH occurs and to consult with staff before acting on these thoughts. Will continue POC. Pt has been in the milieu today, interacting with peers. Pt has been cooperative and pleasant.

## 2014-12-17 NOTE — Progress Notes (Signed)
Patient ID: Cody [name], male   DOB: 01/19/1991, 24 y.o.   MRN: [id number]  12/17/2014 1640  Pt lying in bed asleep. Writer approached and awoke pt to complete the scheduled malnutrition screening. Pt sat on the edge of the bed, did not respond to any of the writer's questions. Pt had visible sweat on his face and his shirt was drenched in the back and front. Pt's vitals taken, BP 137/70 P 112 R 18 Temp 97.6 02 100%. Pt denies headache, dizziness or chest pain. Pt states "I'm just tired, it's different nightmares all the time." Pt walked back to room and writer attempted to complete screening. Pt states in a loud voice with clenched teeth, "Can you stop all the clicking, it's all I can hear" to Clinical research associatewriter. Pt wide eyed and carefully watching people walk through the hallway. Pt given prn medication, see MAR.    1655 - Pt currently talking with tech and smiling, will continue to monitor.

## 2014-12-17 NOTE — Progress Notes (Signed)
D:Patient in his room on approach.  Patient states he had a good day.  Patient states he has been visible on the unit.  Patient pleasant and cooperative with Clinical research associatewriter during assessment.  Patient speaks about his daughter and smiles.  Patient appears minimally preoccupied.  Patient denies SI/HI and denies AVH. A: Staff to monitor Q 15 mins for safety.  Encouragement and support offered.  Scheduled medications administered per orders. R: Patient remains safe on the unit.  Patient attended group tonight.  Patient visible on the unit.  Patient taking administered medications.

## 2014-12-18 MED ORDER — NICOTINE 21 MG/24HR TD PT24
21.0000 mg | MEDICATED_PATCH | Freq: Every day | TRANSDERMAL | Status: DC
  Filled 2014-12-18 (×2): qty 1
  Filled 2014-12-18: qty 14

## 2014-12-18 MED ORDER — NICOTINE 21 MG/24HR TD PT24
MEDICATED_PATCH | TRANSDERMAL | Status: AC
  Administered 2014-12-18: 15:00:00
  Filled 2014-12-18: qty 1

## 2014-12-18 NOTE — [facility] Group Notes (Signed)
[facility] Group Notes:  (Counselor/Nursing/MHT/Case Management/Adjunct)  12/18/2014 1:15PM  Type of Therapy:  Group Therapy  Participation Level:  Active  Participation Quality:  Appropriate  Affect:  Flat  Cognitive:  Oriented  Insight:  Improving  Engagement in Group:  Limited  Engagement in Therapy:  Limited  Modes of Intervention:  Discussion, Exploration and Socialization  Summary of Progress/Problems: The topic for group was balance in life.  Pt participated in the discussion about when their life was in balance and out of balance and how this feels.  Pt discussed ways to get back in balance and short term goals they can work on to get where they want to be. Left a couple of times, but returned each time.  Talked about being part of a fraternity where they were learning "steps" and went to "stomp competitions."  Talked about combined motivators of encouragement by team members and getting yelled at by coach.  He gave his a demonstration of rhythm.  Was animated while talking about this. He is goal directed and grounded in reality.   Daryel Geraldorth, Shanece Cochrane B 12/18/2014 1:05 PM

## 2014-12-18 NOTE — Progress Notes (Signed)
[facility] MD Progress Note  12/18/2014 6:50 PM Cody [name]  MRN:  [id number] Subjective:  Cody [name] states he is feeling better. He denies active symptomatology. Once he gets out he is going to reassess the status of his relationship with a girlfriend. Says he proposed but that she lost the ring. He is going home with his mother and step father. He states he is committed not to smoke marijuana Principal Problem: Schizoaffective disorder, bipolar type Diagnosis:   Patient Active Problem List   Diagnosis Date Noted  . Cannabis use disorder, severe, dependence [F12.20] 12/11/2014  . Schizoaffective disorder, bipolar type [F25.0]    Total Time spent with patient: 30 minutes   Past Medical History:  Past Medical History  Diagnosis Date  . Schizophrenia   . Anxiety    History reviewed. No pertinent past surgical history. Family History: History reviewed. No pertinent family history. Social History:  History  Alcohol Use No     History  Drug Use  . Yes  . Special: Marijuana    History   Social History  . Marital Status: Single    Spouse Name: N/A  . Number of Children: N/A  . Years of Education: N/A   Social History Main Topics  . Smoking status: Smoker, Current Status Unknown  . Smokeless tobacco: Never Used  . Alcohol Use: No  . Drug Use: Yes    Special: Marijuana  . Sexual Activity: Yes   Other Topics Concern  . None   Social History Narrative   Additional History:    Sleep: Fair  Appetite:  Fair   Assessment:   Musculoskeletal: Strength & Muscle Tone: within normal limits Gait & Station: normal Patient leans: normal   Psychiatric Specialty Exam: Physical Exam  Review of Systems  Constitutional: Negative.   HENT: Negative.   Eyes: Negative.   Respiratory: Negative.   Cardiovascular: Negative.   Gastrointestinal: Negative.   Genitourinary: Negative.   Musculoskeletal: Negative.   Skin: Negative.   Endo/Heme/Allergies: Negative.    Psychiatric/Behavioral: Positive for substance abuse. The patient is nervous/anxious.     Blood pressure 116/68, pulse 106, temperature 98.7 F (37.1 C), temperature source Oral, resp. rate 20, height  (1.702 m), weight 73.936 kg (163 lb), SpO2 100 %.Body mass index is 25.52 kg/(m^2).  General Appearance: Fairly Groomed  Patent attorney::  Fair  Speech:  Clear and Coherent  Volume:  Normal  Mood:  Euthymic  Affect:  Appropriate  Thought Process:  Coherent and Goal Directed  Orientation:  Full (Time, Place, and Person)  Thought Content:  plans as when he goes home  Suicidal Thoughts:  No  Homicidal Thoughts:  No  Memory:  Immediate;   Fair Recent;   Fair Remote;   Fair  Judgement:  Fair  Insight:  Present  Psychomotor Activity:  Normal  Concentration:  Fair  Recall:  [employer] of Knowledge:Fair  Language: Fair  Akathisia:  No  Handed:  Right  AIMS (if indicated):     Assets:  Desire for Improvement Housing Social Support  ADL's:  Intact  Cognition: WNL  Sleep:  Number of Hours: 6.25     Current Medications: Current Facility-Administered Medications  Medication Dose Route Frequency Provider Last Rate Last Dose  . acetaminophen (TYLENOL) tablet 650 mg  650 mg Oral Q6H PRN Thermon Leyland, NP   650 mg at 12/15/14 1332  . alum & mag hydroxide-simeth (MAALOX/MYLANTA) 200-200-20 MG/5ML suspension 30 mL  30 mL Oral Q4H PRN Gerome Apley  Earlene Plateravis, NP      . benztropine (COGENTIN) tablet 0.5 mg  0.5 mg Oral QHS Jomarie LongsSaramma Eappen, MD   0.5 mg at 12/17/14 2109  . benztropine (COGENTIN) tablet 1 mg  1 mg Oral Daily Thermon LeylandLaura A Davis, NP   1 mg at 12/18/14 16100814  . divalproex (DEPAKOTE) DR tablet 500 mg  500 mg Oral Q12H Adonis BrookSheila Agustin, NP   500 mg at 12/18/14 0814  . hydrOXYzine (ATARAX/VISTARIL) tablet 25 mg  25 mg Oral Q6H PRN Thermon LeylandLaura A Davis, NP   25 mg at 12/17/14 1211  . magnesium hydroxide (MILK OF MAGNESIA) suspension 30 mL  30 mL Oral Daily PRN Thermon LeylandLaura A Davis, NP      . Melene Muller[START ON 12/19/2014]  nicotine (NICODERM CQ - dosed in mg/24 hours) patch 21 mg  21 mg Transdermal Daily Saramma Eappen, MD      . OLANZapine zydis (ZYPREXA) disintegrating tablet 10 mg  10 mg Oral Q8H PRN Jomarie LongsSaramma Eappen, MD   10 mg at 12/14/14 2354   Or  . OLANZapine (ZYPREXA) injection 10 mg  10 mg Intramuscular Q8H PRN Jomarie LongsSaramma Eappen, MD      . risperiDONE (RISPERDAL) tablet 2 mg  2 mg Oral Daily Jomarie LongsSaramma Eappen, MD   2 mg at 12/18/14 0814  . risperiDONE (RISPERDAL) tablet 2 mg  2 mg Oral QHS Thermon LeylandLaura A Davis, NP   2 mg at 12/17/14 2109  . traZODone (DESYREL) tablet 100 mg  100 mg Oral QHS Thermon LeylandLaura A Davis, NP   100 mg at 12/17/14 2108    Lab Results: No results found for this or any previous visit (from the past 48 hour(s)).  Physical Findings: AIMS: Facial and Oral Movements Muscles of Facial Expression: None, normal Lips and Perioral Area: None, normal Jaw: None, normal Tongue: None, normal,Extremity Movements Upper (arms, wrists, hands, fingers): None, normal Lower (legs, knees, ankles, toes): None, normal, Trunk Movements Neck, shoulders, hips: None, normal, Overall Severity Severity of abnormal movements (highest score from questions above): None, normal Incapacitation due to abnormal movements: None, normal Patient's awareness of abnormal movements (rate only patient's report): No Awareness, Dental Status Current problems with teeth and/or dentures?: No Does patient usually wear dentures?: No  CIWA:    COWS:     Treatment Plan Summary: Daily contact with patient to assess and evaluate symptoms and progress in treatment and Medication management Supportive approach/coping skills Schizoaffective disorder; continue the Depakote and the Zyprexa and the Risperdal  He is responding well to this regime without side effects Work a relapse prevention plan  Medical Decision Making:  Review of Psycho-Social Stressors (1) and Review of Medication Regimen & Side Effects (2)     Sina Lucchesi A 12/18/2014, 6:50  PM

## 2014-12-18 NOTE — [facility] Group Notes (Signed)
[facility] Group Notes:  (Nursing/MHT/Case Management/Adjunct)  Date:  12/18/2014  Time:  0915am  Type of Therapy:  Nurse Education  Participation Level:  Did Not Attend  Participation Quality:  Did not attend  Affect:  Did not attend  Cognitive:  Did not attend  Insight:  None  Engagement in Group:  Did not attend  Modes of Intervention:  Discussion, Education and Support  Summary of Progress/Problems: Patient was invited to group. Pt did not attend and remained in bed resting.  Lendell CapriceGuthrie, Edvin Albus A 12/18/2014, 10:12 AM

## 2014-12-18 NOTE — Tx Team (Signed)
Interdisciplinary Treatment Plan Update (Adult)  Date:  12/18/2014   Time Reviewed:  3:08 PM   Progress in Treatment: Attending groups: Yes. Participating in groups:  Yes. Taking medication as prescribed:  Yes. Tolerating medication:  Yes. Family/Significant othe contact made:  Yes Patient understands diagnosis:  Yes   Discussing patient identified problems/goals with staff:  Yes, see initial care plan. Medical problems stabilized or resolved:  Yes. Denies suicidal/homicidal ideation: Yes. Issues/concerns per patient self-inventory:  No. Other:  New problem(s) identified:  Discharge Plan or Barriers: return home, follow up outpt  Reason for Continuation of Hospitalization:   Comments:  Estimated length of stay: Likely d/c tomorrow  New goal(s):  Review of initial/current patient goals per problem list:     Attendees: Patient:  12/18/2014 3:08 PM   Family:   12/18/2014 3:08 PM   Physician:  Jomarie LongsSaramma Eappen, MD 12/18/2014 3:08 PM   Nursing:  Merian CapronMarian Friedman, RN 12/18/2014 3:08 PM   CSW:    Daryel Geraldodney Corynn Solberg, LCSW   12/18/2014 3:08 PM   Other:  12/18/2014 3:08 PM   Other:   12/18/2014 3:08 PM   Other:  Onnie BoerJennifer Clark, Nurse CM 12/18/2014 3:08 PM   Other:  Leisa LenzValerie Enoch, [facility] 12/18/2014 3:08 PM   Other:  Tomasita Morrowelora Sutton, P4CC  12/18/2014 3:08 PM   Other:  12/18/2014 3:08 PM   Other:  12/18/2014 3:08 PM   Other:  12/18/2014 3:08 PM   Other:  12/18/2014 3:08 PM   Other:  12/18/2014 3:08 PM   Other:   12/18/2014 3:08 PM    Scribe for Treatment Team:   Ida RogueNorth, Armon Orvis B, 12/18/2014 3:08 PM

## 2014-12-18 NOTE — Progress Notes (Signed)
The focus of this group is to help patients review their daily goal of treatment and discuss progress on daily workbooks. Pt attended the evening group session and responded to all discussion prompts from the Writer. Pt shared that today was a good day on the unit. Cody [name] was hopeful that he may discharge from the hospital soon, possibly tomorrow. "I'm ready to do what I need to in order to stay well, so I can see my family again." Pt was praised for the progress made since first being admitted here. Cody [name] reported having no additional needs from Nursing Staff this evening. Pt's affect was appropriate.

## 2014-12-18 NOTE — Progress Notes (Signed)
D: Patient is alert and oriented. Pt's mood and affect is pleasant and flat. Pt denies SI/HI and AVH. Pt appears preoccupied. Pt remains isolative in bed, pt observed covering head with blanket. Pt rates depression 4/10, hopelessness 5/10, and anxiety 6/10. Pt reports his goal for the day is "helping others." Pt is attending some unit groups today. Pt minimally engages with RN throughout the day. A: Active listening by RN. Encouragement/Support provided to pt. Medication education reviewed with pt. Scheduled medications administered per providers orders (See MAR). 15 minute checks continued per protocol for patient safety.  R: Patient cooperative and receptive to nursing interventions. Pt remains safe.

## 2014-12-18 NOTE — Clinical Social Work Note (Signed)
VM from [name], [place] [facility] [phone]) wanting to assist w dc planning.  CSW [place] notified.  [name], LCSW Clinical Social Worker

## 2014-12-18 NOTE — Plan of Care (Signed)
Problem: Ineffective individual coping Goal: STG: Patient will remain free from self harm Outcome: Progressing Patient remains free from self harm. 15 minute checks continued per protocol for patient safety.   Problem: Diagnosis: Increased Risk For Suicide Attempt Goal: LTG-Patient Will Show Positive Response to Medication LTG (by discharge) : Patient will show positive response to medication and will participate in the development of the discharge plan.  Outcome: Progressing Patient appears to have made positive response to medication and has improved since admission. Goal: STG-Patient Will Comply With Medication Regime Outcome: Progressing Patient has adhered to medication regimen today with ease.

## 2014-12-19 ENCOUNTER — Encounter (HOSPITAL_COMMUNITY): Payer: Self-pay | Admitting: Registered Nurse

## 2014-12-19 MED ORDER — HYDROXYZINE HCL 25 MG PO TABS
25.0000 mg | ORAL_TABLET | Freq: Three times a day (TID) | ORAL | Status: DC | PRN
  Filled 2014-12-19 (×2): qty 42

## 2014-12-19 MED ORDER — DIVALPROEX SODIUM 500 MG PO DR TAB: 500.0000 mg | DELAYED_RELEASE_TABLET | Freq: Two times a day (BID) | ORAL | Status: DC

## 2014-12-19 MED ORDER — RISPERIDONE 2 MG PO TABS: ORAL_TABLET | ORAL | Status: DC

## 2014-12-19 MED ORDER — BENZTROPINE MESYLATE 0.5 MG PO TABS: ORAL_TABLET | ORAL | Status: DC

## 2014-12-19 MED ORDER — RISPERIDONE 2 MG PO TABS: 2.0000 mg | ORAL_TABLET | Freq: Two times a day (BID) | ORAL | Status: DC

## 2014-12-19 MED ORDER — NICOTINE 21 MG/24HR TD PT24: 21.0000 mg | MEDICATED_PATCH | Freq: Every day | TRANSDERMAL | Status: DC

## 2014-12-19 MED ORDER — TRAZODONE HCL 100 MG PO TABS: 100.0000 mg | ORAL_TABLET | Freq: Every evening | ORAL | Status: DC | PRN

## 2014-12-19 MED ORDER — RISPERIDONE 2 MG PO TABS
2.0000 mg | ORAL_TABLET | Freq: Two times a day (BID) | ORAL | Status: DC
  Filled 2014-12-19 (×5): qty 42

## 2014-12-19 MED ORDER — HYDROXYZINE HCL 25 MG PO TABS: 25.0000 mg | ORAL_TABLET | Freq: Three times a day (TID) | ORAL | Status: DC | PRN

## 2014-12-19 NOTE — Progress Notes (Signed)
D. Pt had been up and active in milieu this evening, engaged with staff and peers in various conversation. Pt had been pleasant and cooperative with care this evening and has not appeared preoccupied or anxious in the milieu. Pt did receive all medications without incident and spoke about hopefully being discharged sometime tomorrow. A. Support and encouragement provided. R. Safety maintained, will continue to monitor.

## 2014-12-19 NOTE — Progress Notes (Addendum)
Patient ID: Cody [name], male   DOB: [birth year], 23 y.o.   MRN: [id number]   Pt discharged home with his father. Pt was stable and smiling at the time of discharge. All papers and prescriptions were given and valuables returned. Verbal understanding expressed. Denies SI/HI and A/VH. Pt given opportunity to express concerns and ask questions.

## 2014-12-19 NOTE — [facility] Suicide Risk Assessment (Signed)
[facility] INPATIENT:  Family/Significant Other Suicide Prevention Education  Suicide Prevention Education:  Education Completed; No one has been identified by the patient as the family member/significant other with whom the patient will be residing, and identified as the person(s) who will aid the patient in the event of a mental health crisis (suicidal ideations/suicide attempt).  With written consent from the patient, the family member/significant other has been provided the following suicide prevention education, prior to the and/or following the discharge of the patient.  The suicide prevention education provided includes the following:  Suicide risk factors  Suicide prevention and interventions  National Suicide Hotline telephone number  [facility] assessment telephone number  [facility] Emergency Assistance 911  [facility] and/or Residential Mobile Crisis Unit telephone number  Request made of family/significant other to:  Remove weapons (e.g., guns, rifles, knives), all items previously/currently identified as safety concern.    Remove drugs/medications (over-the-counter, prescriptions, illicit drugs), all items previously/currently identified as a safety concern.  The family member/significant other verbalizes understanding of the suicide prevention education information provided.  The family member/significant other agrees to remove the items of safety concern listed above.The patient did not endorse SI at the time of admission, nor did the patient c/o SI during the stay here.  SPE not required.  However, I did talk to mother Helaine Chessracy Holt about a crises plan.  Daryel Geraldorth, Esbeydi Manago B 12/19/2014, 11:10 AM

## 2014-12-19 NOTE — Progress Notes (Signed)
  [facility] :  Will you be returning to the same living situation after discharge:  Yes,  home At discharge, do you have transportation home?: Yes,  mother Do you have the ability to pay for your medications: Yes,  mental health  Release of information consent forms completed and in the chart;  Patient's signature needed at discharge.  Patient to Follow up at: Follow-up Information    Follow up with [facility].   Why:  The Transitional Care Team will contact you with [facility]  They would be glad to help with transportation as well.  If you do not hear from them by the end of the day tomorrow, call Leisa LenzValerie Enoch at [phone]   Contact information:   [address]  [place]  [phone]      [facility].   Why:  Resume your usual schedule after d/c from the hospital.      Patient denies SI/HI: Yes,  yes    Safety Planning and Suicide Prevention discussed: Yes,  yes  Have you used any form of tobacco in the last 30 days? (Cigarettes, Smokeless Tobacco, Cigars, and/or Pipes): No  Has patient been referred to the Quitline?: Yes, faxed on 12/19/14  Ida Rogueorth, Lovenia Debruler B 12/19/2014, 10:38 AM

## 2014-12-19 NOTE — [facility] Suicide Risk Assessment (Signed)
[facility]   Demographic Factors:  Male  Total Time spent with patient: 30 minutes  Musculoskeletal: Strength & Muscle Tone: within normal limits Gait & Station: normal Patient leans: normally  Psychiatric Specialty Exam: Physical Exam  Review of Systems  Constitutional: Negative.   HENT: Negative.   Eyes: Negative.   Respiratory: Negative.   Cardiovascular: Negative.   Gastrointestinal: Negative.   Genitourinary: Negative.   Musculoskeletal: Negative.   Skin: Negative.   Neurological: Negative.   Endo/Heme/Allergies: Negative.   Psychiatric/Behavioral: Positive for substance abuse.    Blood pressure 120/82, pulse 100, temperature 98.9 F (37.2 C), temperature source Oral, resp. rate 18, height 5\' 7"  (1.702 m), weight 73.936 kg (163 lb), SpO2 100 %.Body mass index is 25.52 kg/(m^2).  General Appearance: Fairly Groomed  Patent attorneyye Contact::  Fair  Speech:  Clear and Coherent409  Volume:  Decreased  Mood:  Euthymic  Affect:  Restricted  Thought Process:  Coherent and Goal Directed  Orientation:  Full (Time, Place, and Person)  Thought Content:  plans as he moves on, relapse prevention plan  Suicidal Thoughts:  No  Homicidal Thoughts:  No  Memory:  Immediate;   Fair Recent;   Fair Remote;   Fair  Judgement:  Fair  Insight:  shallow  Psychomotor Activity:  Normal  Concentration:  Fair  Recall:  [employer]Fair  Fund of Knowledge:Fair  Language: Fair  Akathisia:  No  Handed:  Right  AIMS (if indicated):     Assets:  Desire for Improvement  Sleep:  Number of Hours: 6.5  Cognition: WNL  ADL's:  Intact   Have you used any form of tobacco in the last 30 days? (Cigarettes, Smokeless Tobacco, Cigars, and/or Pipes): No  Has this patient used any form of tobacco in the last 30 days? (Cigarettes, Smokeless Tobacco, Cigars, and/or Pipes) No  Mental Status Per Nursing Assessment::   On Admission:  NA  Current Mental Status by Physician: In full contact with  reality. Denies hallucinations. There are no active SI plans or intent. He states he feels ready to go home. He will be home with his mother step father and brother. He states he is going to continue to take his medications. He is also planning to abstain from smoking marijuana.    Loss Factors: NA  Historical Factors: NA  Risk Reduction Factors:   Living with another person, especially a relative and Positive social support  Continued Clinical Symptoms:  Alcohol/Substance Abuse/Dependencies/Schizoaffective Disorder  Cognitive Features That Contribute To Risk:  Closed-mindedness, Polarized thinking and Thought constriction (tunnel vision)    Suicide Risk:  Minimal: No identifiable suicidal ideation.  Patients presenting with no risk factors but with morbid ruminations; may be classified as minimal risk based on the severity of the depressive symptoms  Principal Problem: Schizoaffective disorder, bipolar type Discharge Diagnoses:  Patient Active Problem List   Diagnosis Date Noted  . Cannabis use disorder, severe, dependence [F12.20] 12/11/2014  . Schizoaffective disorder, bipolar type [F25.0]     Follow-up Information    Follow up with [facility].   Why:  The Transitional Care Team will contact [facility]  They would be glad to help with transportation as well.  If you do not hear from them by the end of the day tomorrow, call Leisa LenzValerie Enoch at [phone]   Contact information:   [address]  [place]  [336] [phone]      Follow up  with [facility].   Why:  Resume your usual schedule after d/c from the hospital.      Plan Of Care/Follow-up recommendations:  Activity:  as tolerated Diet:  regular Follow up [facility] as above Is patient on multiple antipsychotic therapies at discharge:  No   Has Patient had three or more failed trials of antipsychotic monotherapy by history:  No  Recommended Plan for Multiple Antipsychotic  Therapies: NA    Shakai Dolley A 12/19/2014, 12:07 PM
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
## 2014-12-19 NOTE — Progress Notes (Signed)
Patient ID: Jeni Sallesnthony Marlett, male   DOB: 05/31/1991, 24 y.o.   MRN: [id number]  Pt currently presents with a flat affect and pleasant behavior. Per self inventory, pt rates depression at a 0, hopelessness 2 and anxiety 3. Pt's daily goal is to "explain how I feel" and they intend to do so by "I will understand more of what's being asked." Pt reports good sleep, good concentration, normal energy and a good appetite. Pt is a positive part of the milieu and interacts with other pts. Pt affect brightens upon approach and he smiles at Clinical research associatewriter when talking about possible discharge today.  Pt provided with medications per providers orders. Pt's labs and vitals were monitored throughout the day. Pt supported emotionally and encouraged to express concerns and questions. Pt educated on medications and suicide risks/prevention. Pt consulted with MD and SW today about discharge plans.   Pt's safety ensured with 15 minute and environmental checks. Pt currently denies SI/HI and A/V hallucinations. Pt verbally agrees to seek staff if SI/HI or A/VH occurs and to consult with staff before acting on these thoughts. Will continue POC. Pt to be discharged today.

## 2014-12-21 ENCOUNTER — Encounter (HOSPITAL_COMMUNITY): Payer: Self-pay

## 2015-09-23 ENCOUNTER — Emergency Department (HOSPITAL_COMMUNITY)
Admission: EM | Admit: 2015-09-23 | Discharge: 2015-09-23 | Disposition: A | Discharge status: Home / Self Care | Payer: Self-pay | Attending: Emergency Medicine | Admitting: Emergency Medicine

## 2015-09-23 ENCOUNTER — Encounter (HOSPITAL_COMMUNITY): Payer: Self-pay | Admitting: Emergency Medicine

## 2015-09-23 DIAGNOSIS — R112 Nausea with vomiting, unspecified: Secondary | ICD-10-CM | POA: Insufficient documentation

## 2015-09-23 DIAGNOSIS — R079 Chest pain, unspecified: Secondary | ICD-10-CM | POA: Insufficient documentation

## 2015-09-23 DIAGNOSIS — R300 Dysuria: Secondary | ICD-10-CM | POA: Insufficient documentation

## 2015-09-23 DIAGNOSIS — Z202 Contact with and (suspected) exposure to infections with a predominantly sexual mode of transmission: Secondary | ICD-10-CM | POA: Insufficient documentation

## 2015-09-23 DIAGNOSIS — Z880 Allergy status to penicillin: Secondary | ICD-10-CM | POA: Insufficient documentation

## 2015-09-23 DIAGNOSIS — R1084 Generalized abdominal pain: Secondary | ICD-10-CM

## 2015-09-23 DIAGNOSIS — R1033 Periumbilical pain: Secondary | ICD-10-CM | POA: Insufficient documentation

## 2015-09-23 DIAGNOSIS — N50819 Testicular pain, unspecified: Secondary | ICD-10-CM | POA: Insufficient documentation

## 2015-09-23 DIAGNOSIS — Z79899 Other long term (current) drug therapy: Secondary | ICD-10-CM | POA: Insufficient documentation

## 2015-09-23 LAB — URINALYSIS, ROUTINE W REFLEX MICROSCOPIC
BILIRUBIN URINE: NEGATIVE
Glucose, UA: NEGATIVE mg/dL
Hgb urine dipstick: NEGATIVE
KETONES UR: NEGATIVE mg/dL
LEUKOCYTES UA: NEGATIVE
Nitrite: NEGATIVE
PH: 7.5 (ref 5.0–8.0)
PROTEIN: NEGATIVE mg/dL
Specific Gravity, Urine: 1.007 (ref 1.005–1.030)

## 2015-09-23 MED ORDER — METRONIDAZOLE 500 MG PO TABS: 2000.0000 mg | ORAL_TABLET | Freq: Once | ORAL | Status: DC

## 2015-09-23 MED ORDER — ONDANSETRON 4 MG PO TBDP
8.0000 mg | ORAL_TABLET | Freq: Once | ORAL | Status: AC
  Administered 2015-09-23: 8 mg via ORAL
  Filled 2015-09-23: qty 2

## 2015-09-23 NOTE — ED Provider Notes (Signed)
CSN: [id number]     Arrival date & time 09/23/15  [id number] History   First MD Initiated Contact with Patient 09/23/15 1116     Chief Complaint  Patient presents with  . Abdominal Pain  . SEXUALLY TRANSMITTED DISEASE    HPI Comments: 25 year old male presents with chest pain, abdominal pain, STD exposure. He states his chest pain and abdominal pain been going on for 2 weeks. Reports associated nausea with vomiting. Denies fever, chills, SOB, diarrhea, constipation. He is also here for STD screen. He states he was exposed to Trichomonas by "not a new partner, but an unreliable one". He did not use protection. Denies discharge however has some testicular tenderness with dysuria.   The history is provided by the patient.    Past Medical History  Diagnosis Date  . Mood disorder (HCC)   . Schizophrenia (HCC)   . Anxiety    History reviewed. No pertinent past surgical history. History reviewed. No pertinent family history. Social History  Substance Use Topics  . Smoking status: Smoker, Current Status Unknown  . Smokeless tobacco: Never Used  . Alcohol Use: Yes    Review of Systems  Constitutional: Negative for fever and chills.  Respiratory: Negative for shortness of breath.   Cardiovascular: Positive for chest pain.  Gastrointestinal: Positive for nausea, vomiting and abdominal pain. Negative for diarrhea and constipation.  Genitourinary: Positive for dysuria and testicular pain. Negative for urgency, hematuria, flank pain, discharge, penile swelling, scrotal swelling, genital sores and penile pain.  Skin: Negative for rash.    Allergies  Penicillins  Home Medications   Prior to Admission medications   Medication Sig Start Date End Date Taking? Authorizing Provider  benztropine (COGENTIN) 0.5 MG tablet Take one tab (0.5 mg) in the morning and one in the evening. 11/10/14   Adonis BrookSheila Agustin, NP  benztropine (COGENTIN) 0.5 MG tablet Take 1 mg (2 tablets) in the morning and take 0.5 mg  (1 tablet) at bed time 12/19/14   Shuvon B Rankin, NP  divalproex (DEPAKOTE) 500 MG DR tablet Take 1 tablet (500 mg total) by mouth every 12 (twelve) hours. 12/19/14   Shuvon B Rankin, NP  gabapentin (NEURONTIN) 100 MG capsule Take 2 capsules (200 mg total) by mouth 3 (three) times daily. 11/10/14   Adonis BrookSheila Agustin, NP  hydrOXYzine (ATARAX/VISTARIL) 25 MG tablet Take 1 tablet (25 mg total) by mouth 3 (three) times daily as needed for anxiety. 12/19/14   Shuvon B Rankin, NP  hydrOXYzine (ATARAX/VISTARIL) 50 MG tablet Take 1 tablet (50 mg total) by mouth every 4 (four) hours as needed for anxiety (sleep). 11/10/14   Adonis BrookSheila Agustin, NP  lithium carbonate 300 MG capsule Take 1 capsule (300 mg total) by mouth 2 (two) times daily with a meal. 11/10/14   Adonis BrookSheila Agustin, NP  nicotine (NICODERM CQ - DOSED IN MG/24 HOURS) 21 mg/24hr patch Place 1 patch (21 mg total) onto the skin daily. 11/10/14   Adonis BrookSheila Agustin, NP  nicotine (NICODERM CQ - DOSED IN MG/24 HOURS) 21 mg/24hr patch Place 1 patch (21 mg total) onto the skin daily. 12/19/14   Shuvon B Rankin, NP  risperiDONE (RISPERDAL M-TABS) 0.5 MG disintegrating tablet Take 7 tablets (3.5 mg total) by mouth at bedtime. 11/10/14   Adonis BrookSheila Agustin, NP  risperiDONE (RISPERDAL M-TABS) 1 MG disintegrating tablet Take 1 tablet (1 mg total) by mouth daily at 2 PM daily at 2 PM. 11/10/14   Adonis BrookSheila Agustin, NP  risperiDONE (RISPERDAL M-TABS) 2 MG disintegrating tablet Take  1 tablet (2 mg total) by mouth daily. 11/10/14   Adonis Brook, NP  risperiDONE (RISPERDAL) 2 MG tablet Take 2 mg (one tablet) Twice daily in the morning and at bed time 12/19/14   Shuvon B Rankin, NP  risperiDONE (RISPERDAL) 2 MG tablet Take 1 tablet (2 mg total) by mouth 2 (two) times daily. 12/19/14   Shuvon B Rankin, NP  traZODone (DESYREL) 100 MG tablet Take 1 tablet (100 mg total) by mouth at bedtime as needed for sleep. 12/19/14   Shuvon B Rankin, NP   BP 120/75 mmHg  Pulse 70  Temp(Src) 99 F (37.2 C) (Oral)  Resp  18  SpO2 100%   Physical Exam  Constitutional: He is oriented to person, place, and time. He appears well-developed and well-nourished. No distress.  HENT:  Head: Normocephalic and atraumatic.  Eyes: Conjunctivae are normal. Pupils are equal, round, and reactive to light. Right eye exhibits no discharge. Left eye exhibits no discharge. No scleral icterus.  Neck: Normal range of motion.  Cardiovascular: Normal rate and regular rhythm.  Exam reveals no gallop and no friction rub.   No murmur heard. Pulmonary/Chest: Effort normal. No respiratory distress. He has no wheezes. He has no rales. He exhibits no tenderness.  Abdominal: Soft. Bowel sounds are normal. He exhibits no distension and no mass. There is tenderness. There is no rebound and no guarding.  Periumbilical tenderness  Genitourinary:  Chaperone present during exam. No inguinal lymphadenopathy or inguinal hernia noted. Normal circumcised penis free of lesion or rash. Testicles are slightly tender bilaterally with normal lie. +Cremasteric reflex. Normal scrotal appearance. No obvious discharge noted   Neurological: He is alert and oriented to person, place, and time.  Skin: Skin is warm and dry.  Psychiatric: He has a normal mood and affect.    ED Course  Procedures (including critical care time) Labs Review Labs Reviewed  URINALYSIS, ROUTINE W REFLEX MICROSCOPIC (NOT AT [facility])  GC/CHLAMYDIA PROBE AMP ([place]) NOT AT [facility]    Imaging Review No results found. I have personally reviewed and evaluated these images and lab results as part of my medical decision-making.   EKG Interpretation None      Meds given in ED:  Medications  ondansetron (ZOFRAN-ODT) disintegrating tablet 8 mg (8 mg Oral Given 09/23/15 1209)    New Prescriptions   No medications on file   MDM   Final diagnoses:  Generalized abdominal pain  STD exposure   25 year old male presents with chest pain, abdominal pain, STD exposure. Chest pain  is unlikely cardiac. EKG is reassuring. Pt states he has a history of asthma with allergies and sometimes his chest gets tight. Zofran given for nausea.   Blood, G&C swab, as well as UA was collected from patient. RN was notified by lab that the tube system is down and all labs that were collected and sent were not received. Patient notified and was offered to have blood redrawn. Pt declined however agreed to give another urine sample. Will send G&C with the urine as well as treat him prophylactically with Metronidazole 2g 1QD. Advised he will be updated with results if culture is positive.  He is non-toxic, NAD, with stable VS. Stable for d/c. Return precautions given. Patient informed of clinical course, understand medical decision-making process, and agree with plan.  Bethel Born, PA-C 09/23/15 1513  Loren Racer, MD 09/24/15 1432

## 2015-09-23 NOTE — ED Notes (Signed)
Pt is in stable condition upon d/c and ambulates from ED. 

## 2015-09-23 NOTE — Discharge Instructions (Signed)

## 2015-09-23 NOTE — ED Notes (Signed)
Pt sts abd pain but denies N/V/D and requests STD check

## 2015-09-24 LAB — GC/CHLAMYDIA PROBE AMP (~~LOC~~) NOT AT ARMC
Chlamydia: NEGATIVE
NEISSERIA GONORRHEA: NEGATIVE

## 2015-10-07 ENCOUNTER — Ambulatory Visit (HOSPITAL_COMMUNITY)
Admission: RE | Admit: 2015-10-07 | Discharge: 2015-10-07 | Disposition: A | Discharge status: Home / Self Care | Payer: Federal, State, Local not specified - Other | Attending: Psychiatry | Admitting: Psychiatry

## 2015-10-07 DIAGNOSIS — F39 Unspecified mood [affective] disorder: Secondary | ICD-10-CM | POA: Insufficient documentation

## 2015-10-07 DIAGNOSIS — F17200 Nicotine dependence, unspecified, uncomplicated: Secondary | ICD-10-CM | POA: Insufficient documentation

## 2015-10-07 DIAGNOSIS — F209 Schizophrenia, unspecified: Secondary | ICD-10-CM | POA: Insufficient documentation

## 2015-10-07 DIAGNOSIS — F1220 Cannabis dependence, uncomplicated: Secondary | ICD-10-CM | POA: Insufficient documentation

## 2015-10-07 DIAGNOSIS — F1010 Alcohol abuse, uncomplicated: Secondary | ICD-10-CM | POA: Insufficient documentation

## 2015-10-07 DIAGNOSIS — F259 Schizoaffective disorder, unspecified: Secondary | ICD-10-CM | POA: Insufficient documentation

## 2015-10-07 DIAGNOSIS — F419 Anxiety disorder, unspecified: Secondary | ICD-10-CM | POA: Insufficient documentation

## 2015-10-07 NOTE — BH Assessment (Addendum)
Assessment Note  Cody [name] is an 25 y.o. male who [facility] as a [facility] for medication stabilization. He was accompanied by his mother, Helaine Chessracy Holt, who pt wanted to participate in the assessment. [facility]. Once d/c, he f/u with [facility]. Two months ago, [facility] took away 2 of his prescribed medications. Pt indicated that he started not to feel like himself and decided to stop him medications altogether a week ago. Neither pt nor mom were able to demonstrate how pt's medication changes have affected his daily functioning, but just reiterated that pt was not himself. Pt denied SI/HI/AVH. They were open to getting a listing of OP providers that accept non-insured.   Diagnosis: Schizoaffective d/o  Past Medical History:  Past Medical History  Diagnosis Date  . Mood disorder (HCC)   . Schizophrenia (HCC)   . Anxiety     No past surgical history on file.  Family History: No family history on file.  Social History:  reports that he has been smoking.  He has never used smokeless tobacco. He reports that he drinks alcohol. He reports that he uses illicit drugs (Marijuana).  Additional Social History:  Alcohol / Drug Use Pain Medications: none reported Prescriptions: Risperdal; Depakote (hasn't taken in a week) Over the Counter: none reported History of alcohol / drug use?: No history of alcohol / drug abuse (pt denied)  CIWA:   COWS:    Allergies:  Allergies  Allergen Reactions  . Penicillins Hives    Has patient had a PCN reaction causing immediate rash, facial/tongue/throat swelling, SOB or lightheadedness with hypotension: Unknown, childhood allergy Has patient had a PCN reaction causing severe rash involving mucus membranes or skin necrosis: No Has patient had a PCN reaction that required hospitalization No Has patient had a PCN reaction occurring within the last 10 years: No If all of the above answers are "NO", then may  proceed with Cephalosporin use.     Home Medications:  (Not in a hospital admission)  OB/GYN Status:  No LMP for male patient.  General Assessment Data [facility] Assessment: In system Is this a Tele or Face-to-Face Assessment?: Face-to-Face Is this an Initial Assessment or a Re-assessment for this encounter?: Initial Assessment Marital status: Single Is patient pregnant?: No Pregnancy Status: No Living Arrangements: Parent, Other relatives Can pt return to current living arrangement?: Yes Admission Status: Voluntary Is patient capable of signing voluntary admission?: Yes Referral Source: Self/Family/Friend Insurance type: none  Medical Screening Exam [facility] Walk[facility] ONLY) Medical Exam completed: No Reason for MSE not completed: Patient Refused  Crisis Care Plan Living Arrangements: Parent, Other relatives Name of Psychiatrist: [facility] Name of Therapist: none  Education Status Is patient currently in school?: No  Risk to self with the past 6 months Suicidal Ideation: No Has patient been a risk to self within the past 6 months prior to admission? : No Suicidal Intent: No Has patient had any suicidal intent within the past 6 months prior to admission? : No Is patient at risk for suicide?: No Suicidal Plan?: No Has patient had any suicidal plan within the past 6 months prior to admission? : No Access to Means: No What has been your use of drugs/alcohol within the last 12 months?: pt denies Previous Attempts/Gestures: No How many times?: 0 Other Self Harm Risks: none Triggers for Past Attempts: Other (Comment) (no reported past attempts) Intentional Self Injurious Behavior: None Family Suicide  History: No Recent stressful life event(s): Other (Comment) (none reported) Persecutory voices/beliefs?: No Depression: No Substance abuse history and/or treatment for substance abuse?: No Suicide prevention information given to  non-admitted patients: Yes  Risk to Others within the past 6 months Homicidal Ideation: No Does patient have any lifetime risk of violence toward others beyond the six months prior to admission? : No Thoughts of Harm to Others: No Current Homicidal Intent: No Current Homicidal Plan: No Access to Homicidal Means: No History of harm to others?: No Assessment of Violence: None Noted Violent Behavior Description: none noted Does patient have access to weapons?: No Criminal Charges Pending?: No Does patient have a court date: No Is patient on probation?: No  Psychosis Hallucinations: None noted Delusions: None noted  Mental Status Report Appearance/Hygiene: Unremarkable Eye Contact: Good Motor Activity: Unremarkable Speech: Logical/coherent Level of Consciousness: Alert Mood: Apprehensive Affect: Appropriate to circumstance Anxiety Level: Minimal Thought Processes: Coherent, Relevant Judgement: Unimpaired Orientation: Person, Place, Situation, Time, Appropriate for developmental age Obsessive Compulsive Thoughts/Behaviors: None  Cognitive Functioning Concentration: Normal Memory: Recent Intact, Remote Intact IQ: Average Insight: Good Impulse Control: Good Appetite: Good Sleep: Decreased Vegetative Symptoms: None  ADLScreening [place] [facility]) Patient's cognitive ability adequate to safely complete daily activities?: Yes Patient able to express need for assistance with ADLs?: Yes Independently performs ADLs?: Yes (appropriate for developmental age)  Prior Inpatient Therapy Prior Inpatient Therapy: Yes Prior Therapy Dates: 2011; 2016 Prior Therapy Facilty/Provider(s): [place]; [facility] Reason for Treatment: schizoaffective d/o  Prior Outpatient Therapy Prior Outpatient Therapy: No Does patient have an ACCT team?: No Does patient have Intensive In-House Services?  : No Does patient have [facility] services? : Yes Does patient have [facility] services?: No  ADL  Screening (condition at time of admission) Patient's cognitive ability adequate to safely complete daily activities?: Yes Is the patient deaf or have difficulty hearing?: No Does the patient have difficulty seeing, even when wearing glasses/contacts?: No Does the patient have difficulty concentrating, remembering, or making decisions?: No Patient able to express need for assistance with ADLs?: Yes Does the patient have difficulty dressing or bathing?: No Independently performs ADLs?: Yes (appropriate for developmental age) Does the patient have difficulty walking or climbing stairs?: No Weakness of Legs: None Weakness of Arms/Hands: None  Home Assistive Devices/Equipment Home Assistive Devices/Equipment: None  Therapy Consults (therapy consults require a physician order) PT Evaluation Needed: No OT Evalulation Needed: No SLP Evaluation Needed: No Abuse/Neglect Assessment (Assessment to be complete while patient is alone) Physical Abuse: Denies Verbal Abuse: Denies Sexual Abuse: Denies Exploitation of patient/patient's resources: Denies Self-Neglect: Denies Values / Beliefs Cultural Requests During Hospitalization: None Spiritual Requests During Hospitalization: None Consults Spiritual Care Consult Needed: No Social Work Consult Needed: No Merchant navy officer (For Healthcare) Does patient have an advance directive?: No Would patient like information on creating an advanced directive?: No - patient declined information    Additional Information 1:1 In Past 12 Months?: No CIRT Risk: No Elopement Risk: No Does patient have medical clearance?: No     Disposition:  Disposition Initial Assessment Completed for this Encounter: Yes Disposition of Patient: Outpatient treatment (consulted with Fransisca Kaufmann, NP) Type of outpatient treatment: Adult (pt given referrals for psychiatry who accepts non-insured)  On Site Evaluation by:   Reviewed with Physician:    Laddie Aquas 10/07/2015 10:18 AM

## 2016-04-16 DIAGNOSIS — F17200 Nicotine dependence, unspecified, uncomplicated: Secondary | ICD-10-CM | POA: Insufficient documentation

## 2016-04-16 DIAGNOSIS — Z79899 Other long term (current) drug therapy: Secondary | ICD-10-CM | POA: Insufficient documentation

## 2016-04-16 DIAGNOSIS — F29 Unspecified psychosis not due to a substance or known physiological condition: Secondary | ICD-10-CM | POA: Insufficient documentation

## 2016-04-17 ENCOUNTER — Encounter (HOSPITAL_COMMUNITY): Payer: Self-pay | Admitting: Emergency Medicine

## 2016-04-17 ENCOUNTER — Emergency Department (HOSPITAL_COMMUNITY)
Admission: EM | Admit: 2016-04-17 | Discharge: 2016-04-17 | Disposition: A | Discharge status: Other Acute Inpatient Hospital | Payer: Self-pay

## 2016-04-17 DIAGNOSIS — F250 Schizoaffective disorder, bipolar type: Secondary | ICD-10-CM

## 2016-04-17 DIAGNOSIS — F1290 Cannabis use, unspecified, uncomplicated: Secondary | ICD-10-CM | POA: Diagnosis present

## 2016-04-17 DIAGNOSIS — F1220 Cannabis dependence, uncomplicated: Secondary | ICD-10-CM

## 2016-04-17 DIAGNOSIS — F259 Schizoaffective disorder, unspecified: Secondary | ICD-10-CM | POA: Diagnosis present

## 2016-04-17 DIAGNOSIS — F1221 Cannabis dependence, in remission: Secondary | ICD-10-CM | POA: Diagnosis present

## 2016-04-17 LAB — CBC WITH DIFFERENTIAL/PLATELET
Basophils Absolute: 0 10*3/uL (ref 0.0–0.1)
Basophils Relative: 0 %
EOS ABS: 0.1 10*3/uL (ref 0.0–0.7)
EOS PCT: 1 %
HCT: 42.1 % (ref 39.0–52.0)
HEMOGLOBIN: 14 g/dL (ref 13.0–17.0)
LYMPHS PCT: 34 %
Lymphs Abs: 3.4 10*3/uL (ref 0.7–4.0)
MCH: 29.2 pg (ref 26.0–34.0)
MCHC: 33.3 g/dL (ref 30.0–36.0)
MCV: 87.9 fL (ref 78.0–100.0)
MONOS PCT: 8 %
Monocytes Absolute: 0.8 10*3/uL (ref 0.1–1.0)
Neutro Abs: 5.7 10*3/uL (ref 1.7–7.7)
Neutrophils Relative %: 57 %
PLATELETS: 235 10*3/uL (ref 150–400)
RBC: 4.79 MIL/uL (ref 4.22–5.81)
RDW: 12.9 % (ref 11.5–15.5)
WBC: 10.1 10*3/uL (ref 4.0–10.5)

## 2016-04-17 LAB — COMPREHENSIVE METABOLIC PANEL
ALBUMIN: 4.5 g/dL (ref 3.5–5.0)
ALK PHOS: 65 U/L (ref 38–126)
ALT: 16 U/L — AB (ref 17–63)
AST: 27 U/L (ref 15–41)
Anion gap: 8 (ref 5–15)
BUN: 18 mg/dL (ref 6–20)
CALCIUM: 9.5 mg/dL (ref 8.9–10.3)
CHLORIDE: 106 mmol/L (ref 101–111)
CO2: 26 mmol/L (ref 22–32)
CREATININE: 0.89 mg/dL (ref 0.61–1.24)
GFR calc Af Amer: >60 mL/min (ref >60)
GFR calc non Af Amer: >60 mL/min (ref >60)
GLUCOSE: 92 mg/dL (ref 65–99)
Potassium: 4.4 mmol/L (ref 3.5–5.1)
SODIUM: 140 mmol/L (ref 135–145)
Total Bilirubin: 1.4 mg/dL — ABNORMAL HIGH (ref 0.3–1.2)
Total Protein: 6.9 g/dL (ref 6.5–8.1)

## 2016-04-17 LAB — ETHANOL: Alcohol, Ethyl (B): <5 mg/dL (ref <5)

## 2016-04-17 LAB — RAPID URINE DRUG SCREEN, HOSP PERFORMED
AMPHETAMINES: NOT DETECTED
Barbiturates: NOT DETECTED
Benzodiazepines: NOT DETECTED
Cocaine: NOT DETECTED
OPIATES: NOT DETECTED
TETRAHYDROCANNABINOL: POSITIVE — AB

## 2016-04-17 LAB — SALICYLATE LEVEL

## 2016-04-17 LAB — ACETAMINOPHEN LEVEL: Acetaminophen (Tylenol), Serum: <10 ug/mL — ABNORMAL LOW (ref 10–30)

## 2016-04-17 MED ORDER — LORAZEPAM 2 MG/ML IJ SOLN
2.0000 mg | Freq: Once | INTRAMUSCULAR | Status: AC
  Administered 2016-04-17: 2 mg via INTRAMUSCULAR
  Filled 2016-04-17: qty 1

## 2016-04-17 MED ORDER — ZIPRASIDONE MESYLATE 20 MG IM SOLR
20.0000 mg | Freq: Once | INTRAMUSCULAR | Status: AC
  Administered 2016-04-17: 20 mg via INTRAMUSCULAR
  Filled 2016-04-17: qty 20

## 2016-04-17 MED ORDER — DIPHENHYDRAMINE HCL 50 MG/ML IJ SOLN
50.0000 mg | Freq: Once | INTRAMUSCULAR | Status: AC
  Administered 2016-04-17: 50 mg via INTRAMUSCULAR
  Filled 2016-04-17: qty 1

## 2016-04-17 MED ORDER — OXCARBAZEPINE 300 MG PO TABS: 300.0000 mg | ORAL_TABLET | Freq: Two times a day (BID) | ORAL | Status: DC

## 2016-04-17 MED ORDER — TRAZODONE HCL 100 MG PO TABS: 100.0000 mg | ORAL_TABLET | Freq: Every evening | ORAL | Status: DC | PRN

## 2016-04-17 MED ORDER — STERILE WATER FOR INJECTION IJ SOLN
INTRAMUSCULAR | Status: AC
  Administered 2016-04-17: 12:00:00
  Filled 2016-04-17: qty 10

## 2016-04-17 MED ORDER — PALIPERIDONE ER 6 MG PO TB24
6.0000 mg | ORAL_TABLET | Freq: Every day | ORAL | Status: DC
  Filled 2016-04-17: qty 1

## 2016-04-17 MED ORDER — AMANTADINE HCL 100 MG PO CAPS: 100.0000 mg | ORAL_CAPSULE | Freq: Two times a day (BID) | ORAL | Status: DC

## 2016-04-17 NOTE — ED Notes (Signed)
Patient stated he was speaking with the president earlier this evening [place].

## 2016-04-17 NOTE — ED Notes (Signed)
Pt discharged IVC and escorted to [facility] via [employer]. Alert, ambulatory and cooperative. Personal belongings given to Emergency planning/management officerolice Officer.

## 2016-04-17 NOTE — ED Notes (Signed)
Bed: [facility] Expected date:  Expected time:  Means of arrival:  Comments: 

## 2016-04-17 NOTE — BH Assessment (Signed)
[facility] Assessment Progress Note  Per Thedore MinsMojeed Akintayo, MD, this pt requires psychiatric hospitalization.  Pt presents under IVC [place] by [facility] [facility], and upheld by Dr Jannifer FranklinAkintayo. At 15:24 Gala Murdochanisha calls from [facility].  Pt has been accepted to their facility by Dr Jeannine KittenFarah; they will be ready to receive pt at 17:30.  Nanine MeansJamison Lord, DNP concurs with this decision.  Pt's nurse, Morrie Sheldonshley, has been notified, and agrees to call report to [phone].  Pt is to be transported via Patent examinerlaw enforcement.   Doylene Canninghomas Minka Knight, MA Triage Specialist [phone]

## 2016-04-17 NOTE — Progress Notes (Signed)
Pt listed as Cody [name] resident without a pcp  CM reviewed pt chart [facility] now in [facility] shower Spoke with [facility] RN who reports pt remains confused ED CM left pt uninsured [name] in his pt belonging bag with [facility] RN    CM provided written information to assist pt with determining choice for uninsured accepting pcps, discussed the importance of pcp vs EDP services for f/u care, www.needymeds.org, www.goodrx.com, discounted pharmacies and other Cody [name] such as [facility] , [facility], affordable care act, financial assistance, uninsured dental services, [place] med assist, DSS and  health department  Provided [name] for [place] uninsured accepting pcps like Cody KussmaulEvans [name], family medicine at [address], [facility], [facility], local urgent care centers, [facility], [facility] family practice, general medical clinics, family services of the [place], [facility] urgent care plus others, medication [name], [facility] out patient pharmacies and housing Provided [facility] contact information

## 2016-04-17 NOTE — BH Assessment (Signed)
[facility] Assessment Progress Note   Case was staffed with Akintayo who recommended an inpatient admission as appropriate bed placement is investigated.

## 2016-04-17 NOTE — Progress Notes (Signed)
Entered in d/c instructions Please use the resources provided to you in emergency room by case manager to assist you're your choice of doctor for follow up  These [place] uninsured resources provide possible primary care providers, resources for discounted medications, housing, dental resources, affordable care act information, plus other resources for [facility] [place]   Please make an appointment

## 2016-04-17 NOTE — ED Triage Notes (Signed)
Patient BIB [employer], [facility] [employer] reports patient walked into hotel with pants around his ankles. When they arrived patient was coming out of restroom, he told [employer] he was Corporate treasurerHercules and [place] [employer] producer. Patient was unaware of month and year. Patient told [employer] that he was at the hotel to meet with Susa LofflerBarack Obama and Adline PotterHilary Clinton to discuss how president Trump is mind controling [place] through [facility]. Patient was able to tell this nurse his name, location, month and year. Patient told Clinical research associatewriter he went to the hotel to watch the movie he produced on the computer. Patient lying in bed, and continues to touch himself.

## 2016-04-17 NOTE — BH Assessment (Addendum)
Assessment Note  Cody [name] is an 25 y.o. male that presents this date under IVC after [employer] [facility] [address] [facility] down. Per IVC: "Respondent does not know what day it is and was yelling something is trying to eat his brains. Respondent stated he had been meeting Dorinda Hillonald trump and he was [employer] CorporationHercules." Patient gave limited information during the assessment and answered "no you don't know me" and "I have a card to get out of here." Patient was oriented to place but gave conflicting information in reference to his history. Per record review, [facility] for psychosis although patient [place] this [place] or [place]. [place] and was just "passing through this area to meet a record producer." Patient denies any S/I, SA use, H/I but admits to AVH but was vague in reference to symptoms. Patient was positive for Cannibis on admission. Patient denies ever being on any medications for MH issues. Patient became agitated at this writer for "asking to many questions that were private." Per admission note: "Patient [facility]IB [employer] [employer] reports patient walked into hotel with pants around his ankles. When they arrived patient was coming out of restroom, he told [employer] he was Corporate treasurerHercules and HarvardLil' [employer] BlockWayne's producer. Patient was unaware of month and year. Patient told [employer] that he was at the hotel to meet with Susa LofflerBarack Obama and Adline PotterHilary Clinton to discuss how president Trump is mind controlling [place] through [facility]. Patient was able to tell this nurse his name, location, month and year. Patient told Clinical research associatewriter he went to the hotel to watch the movie he produced on the computer. Patient lying in bed, and continues to touch himself." Case was staffed with Akintayo MD who recommended an inpatient admission as appropriate bed placement is investigated.  Diagnosis: Schizophrenia (per notes)  Past Medical  History:  Past Medical History:  Diagnosis Date  . Anxiety   . Mood disorder (HCC)   . Schizophrenia (HCC)     History reviewed. No pertinent surgical history.  Family History: History reviewed. No pertinent family history.  Social History:  reports that he has been smoking.  He has never used smokeless tobacco. He reports that he drinks alcohol. He reports that he uses drugs, including Marijuana.  Additional Social History:  Alcohol / Drug Use Pain Medications: none reported Prescriptions: No medications since 2005 Over the Counter: none reported History of alcohol / drug use?: Yes Longest period of sobriety (when/how long): Unknown Withdrawal Symptoms:  (pt denies) Substance #1 Name of Substance 1: Cannabis 1 - Age of First Use: 20 1 - Amount (size/oz): 2 grams 1 - Frequency: daily 1 - Duration: Last year 1 - Last Use / Amount: 03/16/16 1 gram  CIWA: CIWA-Ar BP: 112/76 Pulse Rate: 84 COWS:    Allergies:  Allergies  Allergen Reactions  . Penicillins Hives    Has patient had a PCN reaction causing immediate rash, facial/tongue/throat swelling, SOB or lightheadedness with hypotension: Unknown, childhood allergy Has patient had a PCN reaction causing severe rash involving mucus membranes or skin necrosis: No Has patient had a PCN reaction that required hospitalization No Has patient had a PCN reaction occurring within the last 10 years: No If all of the above answers are "NO", then may proceed with Cephalosporin use.     Home Medications:  (Not in a hospital admission)  OB/GYN Status:  No  LMP for male patient.  General Assessment Data [facility] TTS Assessment: In system Is this a Tele or Face-to-Face Assessment?: Face-to-Face Is this an Initial Assessment or a Re-assessment for this encounter?: Initial Assessment Marital status: Single Maiden name: na Is patient pregnant?: No Pregnancy Status: No Living Arrangements: Alone Can pt return to  current living arrangement?: Yes Admission Status: Involuntary Is patient capable of signing voluntary admission?: No Referral Source: Other ([employer]) Insurance type: None  Medical Screening Exam [facility] Walk[facility]) Medical Exam completed: Yes  Crisis Care Plan Living Arrangements: Alone Legal Guardian:  (na) Name of Psychiatrist: None Name of Therapist: None  Education Status Is patient currently in school?: No Current Grade: na Highest grade of school patient has completed: 12 Name of school: na Contact person: na  Risk to self with the past 6 months Suicidal Ideation: No Has patient been a risk to self within the past 6 months prior to admission? : No Suicidal Intent: No Has patient had any suicidal intent within the past 6 months prior to admission? : No Is patient at risk for suicide?: No Suicidal Plan?: No Has patient had any suicidal plan within the past 6 months prior to admission? : No Access to Means: No What has been your use of drugs/alcohol within the last 12 months?: Current use Previous Attempts/Gestures: No (pt denies) How many times?: 0 (per patient) Other Self Harm Risks: none Triggers for Past Attempts: Unknown Intentional Self Injurious Behavior: None Family Suicide History: No Recent stressful life event(s):  (not known) Persecutory voices/beliefs?: No Depression: No Depression Symptoms:  (denies) Substance abuse history and/or treatment for substance abuse?: No Suicide prevention information given to non-admitted patients: Not applicable  Risk to Others within the past 6 months Homicidal Ideation: No Does patient have any lifetime risk of violence toward others beyond the six months prior to admission? : No Thoughts of Harm to Others: No Current Homicidal Intent: No Current Homicidal Plan: No Access to Homicidal Means: No Identified Victim:  (na) History of harm to others?: No (per patient) Assessment of Violence: None Noted Violent Behavior  Description: na Does patient have access to weapons?: No Criminal Charges Pending?: No Does patient have a court date: No Is patient on probation?: No  Psychosis Hallucinations: Auditory, Visual Delusions: None noted  Mental Status Report Appearance/Hygiene: In scrubs Eye Contact: Fair Motor Activity: Agitation Speech: Loud Level of Consciousness: Alert Mood: Anxious Affect: Anxious Anxiety Level: Moderate Thought Processes: Tangential Judgement: Partial Orientation: Place Obsessive Compulsive Thoughts/Behaviors: None  Cognitive Functioning Concentration: Decreased Memory: Recent Intact IQ: Average Insight: Poor Impulse Control: Fair Appetite: Fair Weight Loss: 0 Weight Gain: 0 Sleep: Decreased Total Hours of Sleep: 4 Vegetative Symptoms: None  ADLScreening [facility] Assessment Services) Patient's cognitive ability adequate to safely complete daily activities?: Yes Patient able to express need for assistance with ADLs?: Yes Independently performs ADLs?: Yes (appropriate for developmental age)  Prior Inpatient Therapy Prior Inpatient Therapy: Yes Prior Therapy Dates: 2016 [facility] Reason for Treatment: Psychosis  Prior Outpatient Therapy Prior Outpatient Therapy: No (pt denies) Prior Therapy Dates: na Prior Therapy Facilty/Provider(s): na Reason for Treatment: na Does patient have an ACCT team?: No Does patient have Intensive In-House Services?  : No Does patient have [facility] services? : No Does patient have P4CC services?: No  ADL Screening (condition at time of admission) Patient's cognitive ability adequate to safely complete daily activities?: Yes Is the patient deaf or have difficulty hearing?: No Does the  patient have difficulty seeing, even when wearing glasses/contacts?: No Does the patient have difficulty concentrating, remembering, or making decisions?: Yes Patient able to express need for assistance with ADLs?:  Yes Does the patient have difficulty dressing or bathing?: No Independently performs ADLs?: Yes (appropriate for developmental age) Does the patient have difficulty walking or climbing stairs?: No Weakness of Legs: None Weakness of Arms/Hands: None  Home Assistive Devices/Equipment Home Assistive Devices/Equipment: None  Therapy Consults (therapy consults require a physician order) PT Evaluation Needed: No OT Evalulation Needed: No SLP Evaluation Needed: No Abuse/Neglect Assessment (Assessment to be complete while patient is alone) Physical Abuse: Denies Verbal Abuse: Denies Sexual Abuse: Denies Exploitation of patient/patient's resources: Denies Self-Neglect: Denies Values / Beliefs Cultural Requests During Hospitalization: None Spiritual Requests During Hospitalization: None Consults Spiritual Care Consult Needed: No Social Work Consult Needed: No Merchant navy officer (For Healthcare) Does patient have an advance directive?: No Would patient like information on creating an advanced directive?: No - patient declined information    Additional Information 1:1 In Past 12 Months?: No CIRT Risk: No Elopement Risk: No Does patient have medical clearance?: Yes     Disposition: Case was staffed with Akintayo MD who recommended an inpatient admission as appropriate bed placement is investigated. Disposition Initial Assessment Completed for this Encounter: Yes Disposition of Patient: Inpatient treatment program Type of inpatient treatment program: Adult  On Site Evaluation by:   Reviewed with Physician:    Alfredia Ferguson 04/17/2016 12:00 PM

## 2016-04-17 NOTE — ED Provider Notes (Signed)
[facility]-EMERGENCY DEPT Provider Note   CSN: [id number] Arrival date & time: 04/16/16  2355     History   Chief Complaint Chief Complaint  Patient presents with  . Altered Mental Status  . Medical Clearance    HPI Cody [name] is a 25 y.o. male.  25 yo M with a chief complaint of hallucinations. Per please see was found at a hotel with his pants around his ankles. He was hallucinating and talking about how he was working with the Democrats to try and overcome [name]. He came to the ED about 7 hours ago. By my exam patient denies any such activity. Denies illegal drug use. Denies hallucinations. Denies prior mental illness.   The history is provided by the patient, medical records and the EMS personnel.  Altered Mental Status   This is a new problem. The current episode started 6 to 12 hours ago. The problem has not changed since onset.Associated symptoms include agitation and hallucinations. Pertinent negatives include no confusion. Risk factors include illicit drug use. His past medical history is significant for psychotropic medication treatment.  Illness  This is a recurrent problem. The current episode started 6 to 12 hours ago. The problem occurs constantly. The problem has not changed since onset.Pertinent negatives include no chest pain, no abdominal pain, no headaches and no shortness of breath.    Past Medical History:  Diagnosis Date  . Anxiety   . Mood disorder (HCC)   . Schizophrenia [facility](HCC)     Patient Active Problem List   Diagnosis Date Noted  . Cannabis use disorder, severe, dependence (HCC) 12/11/2014  . Schizoaffective disorder, bipolar type (HCC)   . Schizoaffective disorder, bipolar type (HCC) 11/05/2014    History reviewed. No pertinent surgical history.     Home Medications    Prior to Admission medications   Medication Sig Start Date End Date Taking? Authorizing Provider  divalproex (DEPAKOTE) 500 MG DR tablet Take 1 tablet (500 mg total)  by mouth every 12 (twelve) hours. Patient not taking: Reported on 04/17/2016 12/19/14   Shuvon B Rankin, NP  metroNIDAZOLE (FLAGYL) 500 MG tablet Take 4 tablets (2,000 mg total) by mouth once. Patient not taking: Reported on 04/17/2016 09/23/15   Bethel BornKelly Marie Gekas, PA-C  traZODone (DESYREL) 100 MG tablet Take 1 tablet (100 mg total) by mouth at bedtime as needed for sleep. Patient not taking: Reported on 04/17/2016 12/19/14   Talmage NapShuvon B Rankin, NP    Family History History reviewed. No pertinent family history.  Social History Social History  Substance Use Topics  . Smoking status: Smoker, Current Status Unknown  . Smokeless tobacco: Never Used  . Alcohol use Yes     Allergies   Penicillins   Review of Systems Review of Systems  Constitutional: Negative for chills and fever.  HENT: Negative for congestion and facial swelling.   Eyes: Negative for discharge and visual disturbance.  Respiratory: Negative for shortness of breath.   Cardiovascular: Negative for chest pain and palpitations.  Gastrointestinal: Negative for abdominal pain, diarrhea and vomiting.  Musculoskeletal: Negative for arthralgias and myalgias.  Skin: Negative for color change and rash.  Neurological: Negative for tremors, syncope and headaches.  Psychiatric/Behavioral: Positive for agitation, behavioral problems and hallucinations. Negative for confusion and dysphoric mood.     Physical Exam Updated Vital Signs BP 110/65   Pulse 69   Temp 98 F (36.7 C)   Resp 18   Ht 5\' 10"  (1.778 m)   Wt 160 lb (  72.6 kg)   SpO2 100%   BMI 22.96 kg/m   Physical Exam  Constitutional: He is oriented to person, place, and time. He appears well-developed and well-nourished.  HENT:  Head: Normocephalic and atraumatic.  Eyes: EOM are normal. Pupils are equal, round, and reactive to light.  Neck: Normal range of motion. Neck supple. No JVD present.  Cardiovascular: Normal rate and regular rhythm.  Exam reveals no gallop and  no friction rub.   No murmur heard. Pulmonary/Chest: No respiratory distress. He has no wheezes.  Abdominal: He exhibits no distension and no mass. There is no tenderness. There is no rebound and no guarding.  Musculoskeletal: Normal range of motion.  Neurological: He is alert and oriented to person, place, and time.  Skin: No rash noted. No pallor.  Psychiatric: He has a normal mood and affect. His behavior is normal.  Nursing note and vitals reviewed.    ED Treatments / Results  Labs (all labs ordered are listed, but only abnormal results are displayed) Labs Reviewed  COMPREHENSIVE METABOLIC PANEL - Abnormal; Notable for the following:       Result Value   ALT 16 (*)    Total Bilirubin 1.4 (*)    All other components within normal limits  RAPID URINE DRUG SCREEN, HOSP PERFORMED - Abnormal; Notable for the following:    Tetrahydrocannabinol POSITIVE (*)    All other components within normal limits  ACETAMINOPHEN LEVEL - Abnormal; Notable for the following:    Acetaminophen (Tylenol), Serum <10 (*)    All other components within normal limits  ETHANOL  CBC WITH DIFFERENTIAL/PLATELET  SALICYLATE LEVEL    EKG  EKG Interpretation None       Radiology No results found.  Procedures Procedures (including critical care time)  Medications Ordered in ED Medications  traZODone (DESYREL) tablet 100 mg (not administered)  Oxcarbazepine (TRILEPTAL) tablet 300 mg (300 mg Oral Refused 04/17/16 1222)  paliperidone (INVEGA) 24 hr tablet 6 mg (0 mg Oral Hold 04/17/16 1508)  amantadine (SYMMETREL) capsule 100 mg (100 mg Oral Refused 04/17/16 1221)  ziprasidone (GEODON) injection 20 mg (20 mg Intramuscular Given 04/17/16 1219)  LORazepam (ATIVAN) injection 2 mg (2 mg Intramuscular Given 04/17/16 1220)  diphenhydrAMINE (BENADRYL) injection 50 mg (50 mg Intramuscular Given 04/17/16 1219)  sterile water (preservative free) injection (  Given 04/17/16 1220)     Initial Impression /  Assessment and Plan / ED Course  I have reviewed the triage vital signs and the nursing notes.  Pertinent labs & imaging results that were available during my care of the patient were reviewed by me and considered in my medical decision making (see chart for details).  Clinical Course    25 yo M With a chief complaint of hallucinations. He was brought him with police and there are multiple people who noted that the patient was acting bizarrely. This may be drug-induced the patient denies drug use. I feel that he is medically clear for TTS evaluation. Will admit.   . The patients results and plan were reviewed and discussed.   Any x-rays performed were independently reviewed by myself.   Differential diagnosis were considered with the presenting HPI.  Medications  traZODone (DESYREL) tablet 100 mg (not administered)  Oxcarbazepine (TRILEPTAL) tablet 300 mg (300 mg Oral Refused 04/17/16 1222)  paliperidone (INVEGA) 24 hr tablet 6 mg (0 mg Oral Hold 04/17/16 1508)  amantadine (SYMMETREL) capsule 100 mg (100 mg Oral Refused 04/17/16 1221)  ziprasidone (GEODON) injection 20 mg (  20 mg Intramuscular Given 04/17/16 1219)  LORazepam (ATIVAN) injection 2 mg (2 mg Intramuscular Given 04/17/16 1220)  diphenhydrAMINE (BENADRYL) injection 50 mg (50 mg Intramuscular Given 04/17/16 1219)  sterile water (preservative free) injection (  Given 04/17/16 1220)    Vitals:   04/17/16 0011 04/17/16 0037 04/17/16 0559 04/17/16 1743  BP: (!) 129/103  112/76 110/65  Pulse: 90  84 69  Resp: 18  18 18   Temp: 98.1 F (36.7 C)  97.8 F (36.6 C) 98 F (36.7 C)  TempSrc: Oral  Oral   SpO2: 98%  96% 100%  Weight:  160 lb (72.6 kg)    Height:  5\' 10"  (1.778 m)      Final diagnoses:  Schizoaffective disorder, bipolar type (HCC)  Cannabis use disorder, severe, dependence (HCC)    Admission/ observation were discussed with the admitting physician, patient and/or family and they are comfortable with the plan.     Final Clinical Impressions(s) / ED Diagnoses   Final diagnoses:  Schizoaffective disorder, bipolar type (HCC)  Cannabis use disorder, severe, dependence [facility])    New Prescriptions Discharge Medication List as of 04/17/2016  6:04 PM       Melene Plan, DO 04/17/16 2008

## 2016-04-17 NOTE — Progress Notes (Signed)
04/17/16 1334:  Pt was sleep and LRT was asked not to wake him up by nurse.  Caroll RancherMarjette Hala Narula, LRT/CTRS

## 2016-04-17 NOTE — ED Notes (Addendum)
Pt. In burgundy scrubs. Pt. And belongings searched and wanded by security. Pt. Has 2 belongings bags. Pt. Has 1 pr. Black boots, 1 jean and 1 black t-shirt. Pt. Has no wallet, no money and no cell phone. pt. Belongings locke up in TCU locker #29.

## 2016-04-17 NOTE — ED Notes (Signed)
Pt admitted to room 43 IVC. Limited assessment was performed due to pt's agitation and non-compliance. Pt noted to be pacing halls hyper-verbal and increasingly aggressive. 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 I've sued hospitals before. None of ya''ll will have a job. I'm a millionaire! There are spirits in this room and all over this hospital." Pt also dry spitting at the nurse's station window and attempted to pull signature pad from the nurse's station computer. When asked pt for the underwear that he was holding in his hand so that it can be placed in a locker with his belongings, pt threw wet underwear into the face of this nurse. NP and Security was notified and IM meds were ordered for agitation. At 1222 Geodon 20mg , Benadryl 50mg  and Ativan 2mg  was administered via IM. Security present for support during injection but pt was cooperative. Did verbally say that he does not take medications but still allowed injections to be given without further incident. At 1240 pt in his room laying down and in no distress.

## 2016-08-06 ENCOUNTER — Emergency Department (HOSPITAL_COMMUNITY)
Admission: EM | Admit: 2016-08-06 | Discharge: 2016-08-07 | Disposition: A | Discharge status: Home / Self Care | Payer: Federal, State, Local not specified - Other | Attending: Emergency Medicine | Admitting: Emergency Medicine

## 2016-08-06 ENCOUNTER — Ambulatory Visit (HOSPITAL_COMMUNITY)
Admission: RE | Admit: 2016-08-06 | Discharge: 2016-08-06 | Disposition: A | Discharge status: Home / Self Care | Payer: Federal, State, Local not specified - Other | Attending: Psychiatry | Admitting: Psychiatry

## 2016-08-06 ENCOUNTER — Encounter (HOSPITAL_COMMUNITY): Payer: Self-pay | Admitting: Emergency Medicine

## 2016-08-06 DIAGNOSIS — F209 Schizophrenia, unspecified: Secondary | ICD-10-CM | POA: Insufficient documentation

## 2016-08-06 DIAGNOSIS — F259 Schizoaffective disorder, unspecified: Secondary | ICD-10-CM | POA: Diagnosis present

## 2016-08-06 DIAGNOSIS — F250 Schizoaffective disorder, bipolar type: Secondary | ICD-10-CM | POA: Insufficient documentation

## 2016-08-06 DIAGNOSIS — F17200 Nicotine dependence, unspecified, uncomplicated: Secondary | ICD-10-CM | POA: Insufficient documentation

## 2016-08-06 DIAGNOSIS — F29 Unspecified psychosis not due to a substance or known physiological condition: Secondary | ICD-10-CM | POA: Insufficient documentation

## 2016-08-06 DIAGNOSIS — Z5181 Encounter for therapeutic drug level monitoring: Secondary | ICD-10-CM | POA: Insufficient documentation

## 2016-08-06 LAB — CBC WITH DIFFERENTIAL/PLATELET
Basophils Absolute: 0 10*3/uL (ref 0.0–0.1)
Basophils Relative: 0 %
EOS ABS: 0 10*3/uL (ref 0.0–0.7)
Eosinophils Relative: 0 %
HEMATOCRIT: 37.1 % — AB (ref 39.0–52.0)
HEMOGLOBIN: 12.8 g/dL — AB (ref 13.0–17.0)
LYMPHS ABS: 2.5 10*3/uL (ref 0.7–4.0)
LYMPHS PCT: 24 %
MCH: 29.1 pg (ref 26.0–34.0)
MCHC: 34.5 g/dL (ref 30.0–36.0)
MCV: 84.3 fL (ref 78.0–100.0)
MONOS PCT: 14 %
Monocytes Absolute: 1.5 10*3/uL — ABNORMAL HIGH (ref 0.1–1.0)
NEUTROS ABS: 6.5 10*3/uL (ref 1.7–7.7)
NEUTROS PCT: 62 %
Platelets: 181 10*3/uL (ref 150–400)
RBC: 4.4 MIL/uL (ref 4.22–5.81)
RDW: 12.8 % (ref 11.5–15.5)
WBC: 10.5 10*3/uL (ref 4.0–10.5)

## 2016-08-06 LAB — COMPREHENSIVE METABOLIC PANEL
ALBUMIN: 4.6 g/dL (ref 3.5–5.0)
ALK PHOS: 47 U/L (ref 38–126)
ALT: 16 U/L — AB (ref 17–63)
ANION GAP: 9 (ref 5–15)
AST: 35 U/L (ref 15–41)
BUN: 15 mg/dL (ref 6–20)
CALCIUM: 9.1 mg/dL (ref 8.9–10.3)
CHLORIDE: 102 mmol/L (ref 101–111)
CO2: 26 mmol/L (ref 22–32)
Creatinine, Ser: 0.97 mg/dL (ref 0.61–1.24)
GFR calc non Af Amer: >60 mL/min (ref >60)
GLUCOSE: 82 mg/dL (ref 65–99)
Potassium: 3.2 mmol/L — ABNORMAL LOW (ref 3.5–5.1)
SODIUM: 137 mmol/L (ref 135–145)
Total Bilirubin: 1.4 mg/dL — ABNORMAL HIGH (ref 0.3–1.2)
Total Protein: 7.4 g/dL (ref 6.5–8.1)

## 2016-08-06 LAB — RAPID URINE DRUG SCREEN, HOSP PERFORMED
AMPHETAMINES: NOT DETECTED
BARBITURATES: NOT DETECTED
Benzodiazepines: NOT DETECTED
Cocaine: NOT DETECTED
OPIATES: NOT DETECTED
TETRAHYDROCANNABINOL: POSITIVE — AB

## 2016-08-06 LAB — SALICYLATE LEVEL

## 2016-08-06 LAB — ACETAMINOPHEN LEVEL

## 2016-08-06 LAB — ETHANOL: Alcohol, Ethyl (B): <5 mg/dL (ref <5)

## 2016-08-06 MED ORDER — TRAZODONE HCL 100 MG PO TABS: 100.0000 mg | ORAL_TABLET | Freq: Every evening | ORAL | Status: DC | PRN

## 2016-08-06 MED ORDER — DIVALPROEX SODIUM 500 MG PO DR TAB
500.0000 mg | DELAYED_RELEASE_TABLET | Freq: Two times a day (BID) | ORAL | Status: DC
  Filled 2016-08-06: qty 1

## 2016-08-06 NOTE — ED Triage Notes (Signed)
Patient taken to Behavioral Health by his mother, then transferred here for medical clearance.  Patient denies SI or HI and any ETOH or drug use.

## 2016-08-06 NOTE — [facility] Counselor (Signed)
Clinican expressed to Darel HongJudy, RN pt was [facility] and his disposition was inpatient treatment. TTS to seek placement.   Gwinda Passereylese D Bennett, MS, [facility], [facility] Triage Specialist [phone]

## 2016-08-06 NOTE — ED Notes (Signed)
Pt stated "I don't need no medicine.  I haven't taken it in a while.  I just got out of jail and my mother brought me to the mental hospital."

## 2016-08-06 NOTE — H&P (Signed)
Behavioral Health Medical Screening Exam  Cody [name] is an 26 y.o. male who [facility] in a manic/psychotic state.  Total Time spent with patient: 15 minutes  Psychiatric Specialty Exam: Physical Exam  Constitutional: He is oriented to person, place, and time. He appears well-developed and well-nourished.  HENT:  Head: Normocephalic.  Right Ear: External ear normal.  Left Ear: External ear normal.  Neck: Normal range of motion.  Cardiovascular: Normal rate and normal heart sounds.   Respiratory: Effort normal and breath sounds normal.  GI: Soft. Bowel sounds are normal.  Musculoskeletal: Normal range of motion.  Neurological: He is alert and oriented to person, place, and time.  Skin: Skin is warm and dry.    Review of Systems  Psychiatric/Behavioral: Positive for hallucinations (psychosis) and substance abuse. Negative for depression, memory loss and suicidal ideas. The patient is not nervous/anxious and does not have insomnia.   All other systems reviewed and are negative.   Blood pressure 121/78, pulse 76, temperature 99 F (37.2 C), temperature source Oral, resp. rate 18, SpO2 98 %.There is no height or weight on file to calculate BMI.  General Appearance: Disheveled  Eye Contact:  Good  Speech:  Pressured  Volume:  Normal  Mood:  Euphoric  Affect:  Congruent, Labile and laughing inappropriately to questions  Thought Process:  Disorganized  Orientation:  Other:  unable to assess: psychotic  Thought Content:  Tangential  Suicidal Thoughts:  unable to assess: psychotic  Homicidal Thoughts:  unsble to assess:psychotic/mania  Memory:  Immediate;   unable to assess: psychotic/manic Recent;   unable to assess:psychotic/manic Remote;   unable to assess:psychotic  Judgement:  Other:  unable to assess: psychotic  Insight:  unable to assess: psychotic  Psychomotor Activity:  Increased  Concentration: Concentration: unable to assess: psychotic and  Attention Span: unable to assess: psychotic  Recall:  unable to assess: psychotic  Fund of Knowledge:unable to assess:psychotic  Language: Good  Akathisia:  No  Handed:  Right  AIMS (if indicated):     Assets:  Financial Resources/Insurance Physical Health Resilience  Sleep:       Musculoskeletal: Strength & Muscle Tone: within normal limits Gait & Station: normal Patient leans: N/A  Blood pressure 121/78, pulse 76, temperature 99 F (37.2 C), temperature source Oral, resp. rate 18, SpO2 98 %.  Recommendations:  Based on my evaluation the patient does not appear to have an emergency medical condition.  Laveda AbbeLaurie Britton Erickson Yamashiro, NP 08/06/2016, 3:39 PM

## 2016-08-06 NOTE — ED Notes (Signed)
EDP called due to pt requesting to leave Md here to sees pt now.

## 2016-08-06 NOTE — ED Notes (Signed)
Patient was in need of some personal hygiene, so sitter gave patient a shower.

## 2016-08-06 NOTE — ED Notes (Signed)
Md to see pt, IVC papers initiated.

## 2016-08-06 NOTE — ED Notes (Signed)
MD at bedside.  Patient not offering much information to MD's questions.  Continues to laugh inappropriately.

## 2016-08-06 NOTE — ED Provider Notes (Signed)
Emergency Department Provider Note   I have reviewed the triage vital signs and the nursing notes.   HISTORY  Chief Complaint Medical Clearance   HPI Cody [name] is a 26 y.o. male with PMH of anxiety and schizophrenia presents to the emergency department from behavioral health after he was dropped her by family. Patient appears to be responding to internal stimulus and is unable to provide a detailed history. Denies SI. No family or friends at bedside for additional information.   Level 5 caveat: Patient with acute psychosis and unable to provide complete history.   Past Medical History:  Diagnosis Date  . Anxiety   . Mood disorder (HCC)   . Schizophrenia [facility](HCC)     Patient Active Problem List   Diagnosis Date Noted  . Cannabis use disorder, severe, dependence (HCC) 12/11/2014  . Schizoaffective disorder, bipolar type (HCC)   . Schizoaffective disorder, bipolar type (HCC) 11/05/2014    History reviewed. No pertinent surgical history.  Current Outpatient Rx  . Order #: [id number] Class: Normal  . Order #: [id number] Class: Print  . Order #: [id number] Class: Normal    Allergies Penicillins  No family history on file.  Social History Social History  Substance Use Topics  . Smoking status: Smoker, Current Status Unknown  . Smokeless tobacco: Never Used  . Alcohol use Yes    Review of Systems  Level 5 caveat: psychosis.   ____________________________________________   PHYSICAL EXAM:  VITAL SIGNS: ED Triage Vitals [08/06/16 1657]  Enc Vitals Group     BP 135/88     Pulse Rate 100     Resp 24     Temp 98.9 F (37.2 C)     Temp Source Oral     SpO2 99 %     Weight 159 lb 4 oz (72.2 kg)     Height 5\' 6"  (1.676 m)   Constitutional: Alert and intermittently laughing.  Eyes: Conjunctivae are normal. PERRL. EOMI. Head: Atraumatic. Nose: No congestion/rhinnorhea. Mouth/Throat: Mucous membranes are moist.  Oropharynx non-erythematous. Neck: No  stridor. Cardiovascular: Tachycardia. Good peripheral circulation. Grossly normal heart sounds.   Respiratory: Normal respiratory effort.  No retractions. Lungs CTAB. Gastrointestinal: Soft and nontender. No distention.  Musculoskeletal: No lower extremity tenderness nor edema. No gross deformities of extremities. Neurologic:  Normal speech and language. No gross focal neurologic deficits are appreciated.  Skin:  Skin is warm, dry and intact. No rash noted. Psychiatric: Mood and affect are slightly agitated. Speech and behavior are bizarre and he appears to be responding to internal stimulus during my interview.   ____________________________________________   LABS (all labs ordered are listed, but only abnormal results are displayed)  Labs Reviewed  COMPREHENSIVE METABOLIC PANEL - Abnormal; Notable for the following:       Result Value   Potassium 3.2 (*)    ALT 16 (*)    Total Bilirubin 1.4 (*)    All other components within normal limits  CBC WITH DIFFERENTIAL/PLATELET - Abnormal; Notable for the following:    Hemoglobin 12.8 (*)    HCT 37.1 (*)    Monocytes Absolute 1.5 (*)    All other components within normal limits  RAPID URINE DRUG SCREEN, HOSP PERFORMED - Abnormal; Notable for the following:    Tetrahydrocannabinol POSITIVE (*)    All other components within normal limits  ACETAMINOPHEN LEVEL - Abnormal; Notable for the following:    Acetaminophen (Tylenol), Serum <10 (*)    All other components within normal limits  ETHANOL  SALICYLATE LEVEL   _______________________________________  RADIOLOGY  None ____________________________________________   PROCEDURES  Procedure(s) performed:   Procedures  None ____________________________________________   INITIAL IMPRESSION / ASSESSMENT AND PLAN / ED COURSE  Pertinent labs & imaging results that were available during my care of the patient were reviewed by me and considered in my medical decision making (see  chart for details).  Patient presents to the emergency department for evaluation of psychiatry complaint. He is laughing inappropriately and appears to be responding to internal stimulus. Cannot completely rule out intoxication but patient has a history of schizophrenia and medication noncompliance so this diagnosis is higher in my differential. Plan for psychiatry screening labs and ultimately TTS evaluation and admission.   Labs reviewed. No acute abnormality to explain the patient's apparent psychosis. TTS evaluation placed. Restarted home medication. Patient is medically clear for psychiatry evaluation.  ____________________________________________  FINAL CLINICAL IMPRESSION(S) / ED DIAGNOSES  Final diagnoses:  Psychosis, unspecified psychosis type     MEDICATIONS GIVEN DURING THIS VISIT:  Medications  divalproex (DEPAKOTE) DR tablet 500 mg (500 mg Oral Refused 08/06/16 2116)  traZODone (DESYREL) tablet 100 mg (not administered)     NEW OUTPATIENT MEDICATIONS STARTED DURING THIS VISIT:  None   Note:  This document was prepared using Dragon voice recognition software and may include unintentional dictation errors.  Alona Bene, MD Emergency Medicine   Maia Plan, MD 08/06/16 [phone]

## 2016-08-06 NOTE — BH Assessment (Signed)
Tele Assessment Note   Cody [name] is an 26 y.o. male. Pt is actively psychotic. Pt is not oriented. Pt had a difficult time completing the assessment. Pt could not comprehend the questions. Pt was poor historian and laughed throughout the assessment. Pt was dropped by a family member. There was no one present with the Pt to provided collateral information.   Collateral information obtained from previous assessments: Previous assessments stated the Pt has been diagnosed with Schizophrenia and has a history of non-compliance with medication. When non-compliance with medication occurs the Pt displays psychotic behaviors. [facility] was prescribing his medication.   Writer consulted with Jacki ConesLaurie, NP. Per Jacki ConesLaurie the Pt meets inpatient criteria. TTS to seek placement.   Diagnosis:  F20.9 Schizophrenia  Past Medical History:  Past Medical History:  Diagnosis Date  . Anxiety   . Mood disorder (HCC)   . Schizophrenia (HCC)     No past surgical history on file.  Family History: No family history on file.  Social History:  reports that he has been smoking.  He has never used smokeless tobacco. He reports that he drinks alcohol. He reports that he uses drugs, including Marijuana.  Additional Social History:  Alcohol / Drug Use Pain Medications: Pt denies Prescriptions: unknown Over the Counter: Pt denies  History of alcohol / drug use?: No history of alcohol / drug abuse Longest period of sobriety (when/how long): unknown  CIWA: CIWA-Ar BP: 121/78 Pulse Rate: 76 COWS:    PATIENT STRENGTHS: (choose at least two) Communication skills Supportive family/friends  Allergies:  Allergies  Allergen Reactions  . Penicillins Hives    Has patient had a PCN reaction causing immediate rash, facial/tongue/throat swelling, SOB or lightheadedness with hypotension: Unknown, childhood allergy Has patient had a PCN reaction causing severe rash involving mucus membranes or skin necrosis: No Has  patient had a PCN reaction that required hospitalization No Has patient had a PCN reaction occurring within the last 10 years: No If all of the above answers are "NO", then may proceed with Cephalosporin use.     Home Medications:  (Not in a hospital admission)  OB/GYN Status:  No LMP for male patient.  General Assessment Data [facility] Assessment: In system Is this a Tele or Face-to-Face Assessment?: Face-to-Face Is this an Initial Assessment or a Re-assessment for this encounter?: Initial Assessment Marital status: Single Maiden name: NA Is patient pregnant?: No Pregnancy Status: No Living Arrangements: Spouse/significant other Can pt return to current living arrangement?: Yes Admission Status: Voluntary Is patient capable of signing voluntary admission?: Yes Referral Source: Self/Family/Friend Insurance type: [employer], [place]  Medical Screening Exam [facility] Walk-in ONLY) Medical Exam completed: Yes  Crisis Care Plan Living Arrangements: Spouse/significant other Legal Guardian: Other: (self) Name of Psychiatrist: [facility] Name of Therapist: NA  Education Status Is patient currently in school?: No Current Grade: NA Highest grade of school patient has completed: unknown Name of school: NA Contact person: NA  Risk to self with the past 6 months Suicidal Ideation: No Has patient been a risk to self within the past 6 months prior to admission? : No Suicidal Intent: No Has patient had any suicidal intent within the past 6 months prior to admission? : No Is patient at risk for suicide?: No Suicidal Plan?: No Has patient had any suicidal plan within the past 6 months prior to admission? : No Access to Means: No What has been your use of drugs/alcohol within the last 12 months?: unknown Previous  Attempts/Gestures: No How many times?: 0 Other Self Harm Risks: Na Triggers for Past Attempts: None known Intentional Self Injurious Behavior:  None Family Suicide History: No Recent stressful life event(s): Other (Comment) (unknown) Persecutory voices/beliefs?: Yes (reported in the past) Depression: No Depression Symptoms:  (unknown) Substance abuse history and/or treatment for substance abuse?: Yes Suicide prevention information given to non-admitted patients: Not applicable  Risk to Others within the past 6 months Homicidal Ideation: Yes-Currently Present (reported on intake form but Pt denies) Does patient have any lifetime risk of violence toward others beyond the six months prior to admission? : Unknown Thoughts of Harm to Others: No Current Homicidal Intent: No Current Homicidal Plan: No Access to Homicidal Means: No Identified Victim: NA History of harm to others?: No Assessment of Violence: None Noted Violent Behavior Description: NA Does patient have access to weapons?: No Criminal Charges Pending?: No Does patient have a court date: No Is patient on probation?: Unknown  Psychosis Hallucinations: Auditory, Visual (In the past but unknown) Delusions: None noted  Mental Status Report Appearance/Hygiene: Disheveled, Body odor, Bizarre Eye Contact: Poor Motor Activity: Freedom of movement Speech: Incoherent, Tangential Level of Consciousness: Alert Mood: Suspicious Affect: Inconsistent with thought content Anxiety Level: Minimal Thought Processes: Circumstantial, Flight of Ideas, Tangential Judgement: Impaired Orientation: Not oriented Obsessive Compulsive Thoughts/Behaviors: None  Cognitive Functioning Concentration: Decreased Memory: Unable to Assess IQ: Average Insight: Unable to Assess Impulse Control: Unable to Assess Appetite: Fair Weight Loss: 0 Weight Gain: 0 Sleep: Unable to Assess Total Hours of Sleep: 8 Vegetative Symptoms: None  ADLScreening [facility]) Patient's cognitive ability adequate to safely complete daily activities?: No Patient able to express need for  assistance with ADLs?: Yes Independently performs ADLs?: Yes (appropriate for developmental age)  Prior Inpatient Therapy Prior Inpatient Therapy: Yes Prior Therapy Dates: multiple Prior Therapy Facilty/Provider(s): [facility] Reason for Treatment: schizophrenia  Prior Outpatient Therapy Prior Outpatient Therapy: Yes Prior Therapy Dates: current Prior Therapy Facilty/Provider(s): [facility] Reason for Treatment: schizophrenia Does patient have an ACCT team?: No Does patient have Intensive In-House Services?  : No Does patient have [facility] services? : Yes Does patient have P4CC services?: No  ADL Screening (condition at time of admission) Patient's cognitive ability adequate to safely complete daily activities?: No Is the patient deaf or have difficulty hearing?: No Does the patient have difficulty seeing, even when wearing glasses/contacts?: No Does the patient have difficulty concentrating, remembering, or making decisions?: No Patient able to express need for assistance with ADLs?: Yes Does the patient have difficulty dressing or bathing?: No Independently performs ADLs?: Yes (appropriate for developmental age) Does the patient have difficulty walking or climbing stairs?: No Weakness of Legs: None Weakness of Arms/Hands: None       Abuse/Neglect Assessment (Assessment to be complete while patient is alone) Physical Abuse: Denies Verbal Abuse: Denies Sexual Abuse: Denies Exploitation of patient/patient's resources: Denies Self-Neglect: Denies     Merchant navy officer (For Healthcare) Does Patient Have a Medical Advance Directive?: No    Additional Information 1:1 In Past 12 Months?: No CIRT Risk: No Elopement Risk: No Does patient have medical clearance?: Yes     Disposition:  Disposition Initial Assessment Completed for this Encounter: Yes Disposition of Patient: Inpatient treatment program Type of inpatient treatment program: Adult  Emmit Pomfret 08/06/2016 4:27 PM

## 2016-08-07 DIAGNOSIS — Z79899 Other long term (current) drug therapy: Secondary | ICD-10-CM | POA: Diagnosis not present

## 2016-08-07 DIAGNOSIS — F1290 Cannabis use, unspecified, uncomplicated: Secondary | ICD-10-CM | POA: Diagnosis not present

## 2016-08-07 DIAGNOSIS — F250 Schizoaffective disorder, bipolar type: Secondary | ICD-10-CM | POA: Diagnosis not present

## 2016-08-07 NOTE — BHH Suicide Risk Assessment (Signed)
Suicide Risk Assessment  Discharge Assessment   [facility]   Principal Problem: Schizoaffective disorder, bipolar type [facility](HCC) Discharge Diagnoses:  Patient Active Problem List   Diagnosis Date Noted  . Schizoaffective disorder, bipolar type (HCC) [F25.0] 11/05/2014    Priority: High  . Cannabis use disorder, severe, dependence (HCC) [F12.20] 12/11/2014    Total Time spent with patient: 45 minutes  Musculoskeletal: Strength & Muscle Tone: within normal limits Gait & Station: normal Patient leans: N/A  Psychiatric Specialty Exam: Physical Exam  Constitutional: He is oriented to person, place, and time. He appears well-developed and well-nourished.  HENT:  Head: Normocephalic.  Neck: Normal range of motion.  Respiratory: Effort normal.  Musculoskeletal: Normal range of motion.  Neurological: He is alert and oriented to person, place, and time.  Psychiatric: He has a normal mood and affect. His speech is normal and behavior is normal. Judgment and thought content normal. Cognition and memory are normal.    Review of Systems  Psychiatric/Behavioral: Positive for substance abuse.  All other systems reviewed and are negative.   Blood pressure 112/71, pulse 83, temperature 99 F (37.2 C), temperature source Oral, resp. rate 16, height 5\' 6"  (1.676 m), weight 72.2 kg (159 lb 4 oz), SpO2 100 %.Body mass index is 25.7 kg/m.  General Appearance: Casual  Eye Contact:  Good  Speech:  Normal Rate  Volume:  Normal  Mood:  Euthymic  Affect:  Congruent  Thought Process:  Coherent and Descriptions of Associations: Intact  Orientation:  Full (Time, Place, and Person)  Thought Content:  WDL and Logical  Suicidal Thoughts:  No  Homicidal Thoughts:  No  Memory:  Immediate;   Good Recent;   Good Remote;   Good  Judgement:  Fair  Insight:  Fair  Psychomotor Activity:  Normal  Concentration:  Concentration: Good and Attention Span: Good  Recall:  Good  Fund  of Knowledge:  Fair  Language:  Good  Akathisia:  No  Handed:  Right  AIMS (if indicated):     Assets:  Housing Leisure Time Physical Health Resilience Social Support  ADL's:  Intact  Cognition:  WNL  Sleep:       Mental Status Per Nursing Assessment::   On Admission:   psychosis  Demographic Factors:  Male and Adolescent or young adult  Loss Factors: NA  Historical Factors: NA  Risk Reduction Factors:   Sense of responsibility to family, Living with another person, especially a relative, Positive social support and Positive therapeutic relationship  Continued Clinical Symptoms:  None  Cognitive Features That Contribute To Risk:  None    Suicide Risk:  Minimal: No identifiable suicidal ideation.  Patients presenting with no risk factors but with morbid ruminations; may be classified as minimal risk based on the severity of the depressive symptoms    Plan Of Care/Follow-up recommendations:  Activity:  as tolerated Diet:  heart healthy diet  Bradly Sangiovanni, NP 08/07/2016, 10:57 AM

## 2016-08-07 NOTE — BH Assessment (Signed)
B[facility] Assessment Progress Note  Per Thedore MinsMojeed Akintayo, MD, this pt does not require psychiatric hospitalization at this time.  Pt presents under IVC initiated by EDP Alona BeneJoshua Long, MD, which Dr Jannifer FranklinAkintayo has rescinded.  [facility]  Pt is also to be provided with resources for the homeless.  These have been included in pt's discharge instructions.  Pt's nurse, Kendal Hymendie, has been notified.  Doylene Canninghomas Mackinzee Roszak, MA Triage Specialist [phone]

## 2016-08-07 NOTE — Consult Note (Signed)
[facility] [place] [facility] Consult   Reason for Consult:  "Came for rest" Referring Physician:  EDP Patient Identification: Cody [name] MRN:  [id number] Principal Diagnosis: Schizoaffective disorder, bipolar type [facility]) Diagnosis:   Patient Active Problem List   Diagnosis Date Noted  . Schizoaffective disorder, bipolar type ([place]) [F25.0] 11/05/2014    Priority: High  . Cannabis use disorder, severe, dependence ([place]) [F12.20] 12/11/2014    Total Time spent with patient: 45 minutes  Subjective:   Cody [name] is a 26 y.o. male patient does not warrant admission.  HPI:  25 you male who presented to the ED with disorientation.  Today, he is clear and coherent, does not want medications.  "I just came for rest."  He uses cannabis and does not want anything else.  No suicidal/homicidal ideations, hallucinations, or withdrawal symptoms.  Stable for discharge.  Past Psychiatric History: schizoaffective disorder  Risk to Self: Is patient at risk for suicide?: No, but patient needs Medical Clearance Risk to Others:  No Prior Inpatient Therapy:  Denies Prior Outpatient Therapy:  Yes  Past Medical History:  Past Medical History:  Diagnosis Date  . Anxiety   . Mood disorder ([place])   . Schizophrenia ([place])    History reviewed. No pertinent surgical history. Family History: No family history on file. Family Psychiatric  History: unknown Social History:  History  Alcohol Use  . Yes     History  Drug Use  . Types: Marijuana    Social History   Social History  . Marital status: Single    Spouse name: N/A  . Number of children: N/A  . Years of education: N/A   Social History Main Topics  . Smoking status: Smoker, Current Status Unknown  . Smokeless tobacco: Never Used  . Alcohol use Yes  . Drug use: Yes    Types: Marijuana  . Sexual activity: Yes   Other Topics Concern  . None   Social History Narrative   ** Merged History Encounter **       Additional Social  History:    Allergies:   Allergies  Allergen Reactions  . Penicillins Hives    Has patient had a PCN reaction causing immediate rash, facial/tongue/throat swelling, SOB or lightheadedness with hypotension: Unknown, childhood allergy Has patient had a PCN reaction causing severe rash involving mucus membranes or skin necrosis: No Has patient had a PCN reaction that required hospitalization No Has patient had a PCN reaction occurring within the last 10 years: No If all of the above answers are "NO", then may proceed with Cephalosporin use.     Labs:  Results for orders placed or performed during the hospital encounter of 08/06/16 (from the past 48 hour(s))  Urine rapid drug screen (hosp performed)not at [facility]     Status: Abnormal   Collection Time: 08/06/16  5:24 PM  Result Value Ref Range   Opiates NONE DETECTED NONE DETECTED   Cocaine NONE DETECTED NONE DETECTED   Benzodiazepines NONE DETECTED NONE DETECTED   Amphetamines NONE DETECTED NONE DETECTED   Tetrahydrocannabinol POSITIVE (A) NONE DETECTED   Barbiturates NONE DETECTED NONE DETECTED    Comment:        DRUG SCREEN FOR MEDICAL PURPOSES ONLY.  IF CONFIRMATION IS NEEDED FOR ANY PURPOSE, NOTIFY LAB WITHIN 5 DAYS.        LOWEST DETECTABLE LIMITS FOR URINE DRUG SCREEN Drug Class       Cutoff (ng/mL) Amphetamine      1000 Barbiturate  200 Benzodiazepine   322 Tricyclics       025 Opiates          300 Cocaine          300 THC              50   Comprehensive metabolic panel     Status: Abnormal   Collection Time: 08/06/16  6:03 PM  Result Value Ref Range   Sodium 137 135 - 145 mmol/L   Potassium 3.2 (L) 3.5 - 5.1 mmol/L   Chloride 102 101 - 111 mmol/L   CO2 26 22 - 32 mmol/L   Glucose, Bld 82 65 - 99 mg/dL   BUN 15 6 - 20 mg/dL   Creatinine, Ser 0.97 0.61 - 1.24 mg/dL   Calcium 9.1 8.9 - 10.3 mg/dL   Total Protein 7.4 6.5 - 8.1 g/dL   Albumin 4.6 3.5 - 5.0 g/dL   AST 35 15 - 41 U/L   ALT 16 (L) 17 - 63 U/L    Alkaline Phosphatase 47 38 - 126 U/L   Total Bilirubin 1.4 (H) 0.3 - 1.2 mg/dL   GFR calc non Af Amer >60 >60 mL/min   GFR calc Af Amer >60 >60 mL/min    Comment: (NOTE) The eGFR has been calculated using the CKD EPI equation. This calculation has not been validated in all clinical situations. eGFR's persistently <60 mL/min signify possible Chronic Kidney Disease.    Anion gap 9 5 - 15  Ethanol     Status: None   Collection Time: 08/06/16  6:03 PM  Result Value Ref Range   Alcohol, Ethyl (B) <5 <5 mg/dL    Comment:        LOWEST DETECTABLE LIMIT FOR SERUM ALCOHOL IS 5 mg/dL FOR MEDICAL PURPOSES ONLY   CBC with Diff     Status: Abnormal   Collection Time: 08/06/16  6:03 PM  Result Value Ref Range   WBC 10.5 4.0 - 10.5 K/uL   RBC 4.40 4.22 - 5.81 MIL/uL   Hemoglobin 12.8 (L) 13.0 - 17.0 g/dL   HCT 37.1 (L) 39.0 - 52.0 %   MCV 84.3 78.0 - 100.0 fL   MCH 29.1 26.0 - 34.0 pg   MCHC 34.5 30.0 - 36.0 g/dL   RDW 12.8 11.5 - 15.5 %   Platelets 181 150 - 400 K/uL   Neutrophils Relative % 62 %   Neutro Abs 6.5 1.7 - 7.7 K/uL   Lymphocytes Relative 24 %   Lymphs Abs 2.5 0.7 - 4.0 K/uL   Monocytes Relative 14 %   Monocytes Absolute 1.5 (H) 0.1 - 1.0 K/uL   Eosinophils Relative 0 %   Eosinophils Absolute 0.0 0.0 - 0.7 K/uL   Basophils Relative 0 %   Basophils Absolute 0.0 0.0 - 0.1 K/uL  Acetaminophen level     Status: Abnormal   Collection Time: 08/06/16  6:03 PM  Result Value Ref Range   Acetaminophen (Tylenol), Serum <10 (L) 10 - 30 ug/mL    Comment:        THERAPEUTIC CONCENTRATIONS VARY SIGNIFICANTLY. A RANGE OF 10-30 ug/mL MAY BE AN EFFECTIVE CONCENTRATION FOR MANY PATIENTS. HOWEVER, SOME ARE BEST TREATED AT CONCENTRATIONS OUTSIDE THIS RANGE. ACETAMINOPHEN CONCENTRATIONS >150 ug/mL AT 4 HOURS AFTER INGESTION AND >50 ug/mL AT 12 HOURS AFTER INGESTION ARE OFTEN ASSOCIATED WITH TOXIC REACTIONS.   Salicylate level     Status: None   Collection Time: 08/06/16  6:03 PM   Result  Value Ref Range   Salicylate Lvl <3.7 2.8 - 30.0 mg/dL    Current Facility-Administered Medications  Medication Dose Route Frequency Provider Last Rate Last Dose  . divalproex (DEPAKOTE) DR tablet 500 mg  500 mg Oral Q12H Margette Fast, MD      . traZODone (DESYREL) tablet 100 mg  100 mg Oral QHS PRN Margette Fast, MD       Current Outpatient Prescriptions  Medication Sig Dispense Refill  . divalproex (DEPAKOTE) 500 MG DR tablet Take 1 tablet (500 mg total) by mouth every 12 (twelve) hours. (Patient not taking: Reported on 04/17/2016) 60 tablet 0  . metroNIDAZOLE (FLAGYL) 500 MG tablet Take 4 tablets (2,000 mg total) by mouth once. (Patient not taking: Reported on 04/17/2016) 4 tablet 0  . traZODone (DESYREL) 100 MG tablet Take 1 tablet (100 mg total) by mouth at bedtime as needed for sleep. (Patient not taking: Reported on 04/17/2016) 30 tablet 0    Musculoskeletal: Strength & Muscle Tone: within normal limits Gait & Station: normal Patient leans: N/A  Psychiatric Specialty Exam: Physical Exam  Constitutional: He is oriented to person, place, and time. He appears well-developed and well-nourished.  HENT:  Head: Normocephalic.  Neck: Normal range of motion.  Respiratory: Effort normal.  Musculoskeletal: Normal range of motion.  Neurological: He is alert and oriented to person, place, and time.  Psychiatric: He has a normal mood and affect. His speech is normal and behavior is normal. Judgment and thought content normal. Cognition and memory are normal.    Review of Systems  Psychiatric/Behavioral: Positive for substance abuse.  All other systems reviewed and are negative.   Blood pressure 112/71, pulse 83, temperature 99 F (37.2 C), temperature source Oral, resp. rate 16, height 5' 6"  (1.676 m), weight 72.2 kg (159 lb 4 oz), SpO2 100 %.Body mass index is 25.7 kg/m.  General Appearance: Casual  Eye Contact:  Good  Speech:  Normal Rate  Volume:  Normal  Mood:  Euthymic   Affect:  Congruent  Thought Process:  Coherent and Descriptions of Associations: Intact  Orientation:  Full (Time, Place, and Person)  Thought Content:  WDL and Logical  Suicidal Thoughts:  No  Homicidal Thoughts:  No  Memory:  Immediate;   Good Recent;   Good Remote;   Good  Judgement:  Fair  Insight:  Fair  Psychomotor Activity:  Normal  Concentration:  Concentration: Good and Attention Span: Good  Recall:  Good  Fund of Knowledge:  Fair  Language:  Good  Akathisia:  No  Handed:  Right  AIMS (if indicated):     Assets:  Housing Leisure Time Physical Health Resilience Social Support  ADL's:  Intact  Cognition:  WNL  Sleep:        Treatment Plan Summary: Daily contact with patient to assess and evaluate symptoms and progress in treatment, Medication management and Plan schizoaffective disorder, bipolar type: -Crisis stabilization -Medication management:  Restarted Depakote 500 mg BID for mood and Trazodone 100 mg at bedtime for sleep, patient does not want it -Individual and substance abuse counseling  Disposition: No evidence of imminent risk to self or others at present.    Waylan Boga, NP 08/07/2016 10:52 AM  Patient seen face-to-face for psychiatric evaluation, chart reviewed and case discussed with the physician extender and developed treatment plan. Reviewed the information documented and agree with the treatment plan. Corena Pilgrim, MD

## 2016-08-07 NOTE — ED Notes (Signed)
Pt discharged ambulatory.  Pt appeared to be responding to internal stimuli but denied this.  Pt denied H/I and S/I.  All belongings were returned to pt.  Discharge instructions and resources were reviewed.

## 2016-08-07 NOTE — Discharge Instructions (Signed)
For your ongoing mental health needs, you are advised to follow up with [facility].  New and returning patients are seen at their walk-in clinic.  Walk-in hours are Monday - Friday from 8:00 am - 3:00 pm.  Walk-in patients are seen on a first come, first served basis.  Try to arrive as early as possible for he best chance of being seen the same day: ° °     [facility] °     [address] °     [place], [place] [address] °     [phone] ° °For your shelter needs, contact the following service providers: ° °     [facility] (operated by [facility]) °     [address] °     [place], [place] [address] °     [phone] ° °     [facility] °     [address] °     [place], [place] [address] °     [phone] ° °For day shelter and other supportive services for the homeless, contact the [facility] ([facility]): ° °     [facility] °     [address] °     [place], [place] [address] °     [phone] ° °For transitional housing, contact one of the following agencies.  They provide longer term housing than a shelter, but there is an application process: ° °     [facility] °     [address] °     [place], [place] [address] °     [phone] ° °     [facility] °     [facility] °     [address] °     [place],  [address] °     [phone] °

## 2016-09-28 IMAGING — CT CT HEAD W/O CM
2 series · 17 of 30 positions shown, 20 images · non-contrast
Comparison: 09/18/2011 head CT

CLINICAL DATA: 23-year-old male with acute altered mental status.
History of schizophrenia.

EXAM:
CT HEAD WITHOUT CONTRAST
TECHNIQUE: Contiguous axial images were obtained from the base of the skull
through the vertex without intravenous contrast.

[Series 2: head w/o · axial · non-contrast · 0.45mm/px · z∈[-116,-6]mm · 9 of 28 slices shown, 12 images]
[im 3/28  brain]
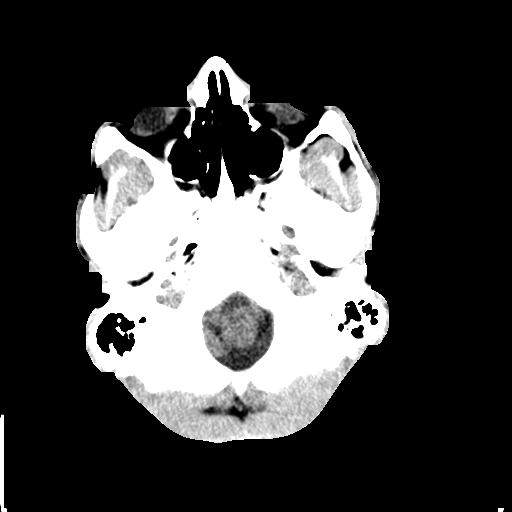
[im 3/28  bone]
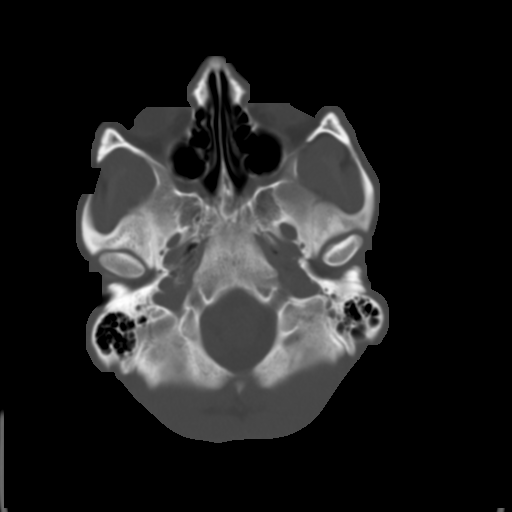
[im 6/28  brain]
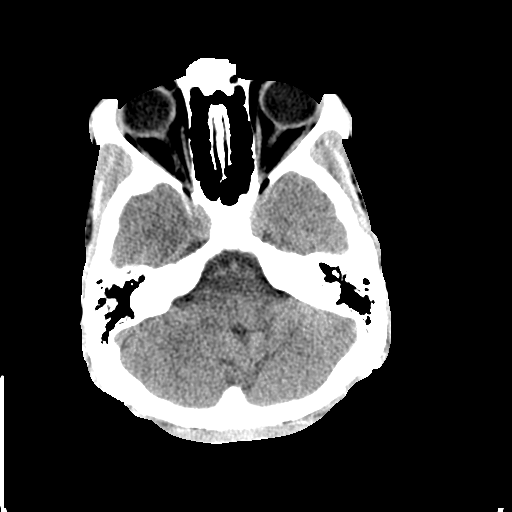
[im 9/28  brain]
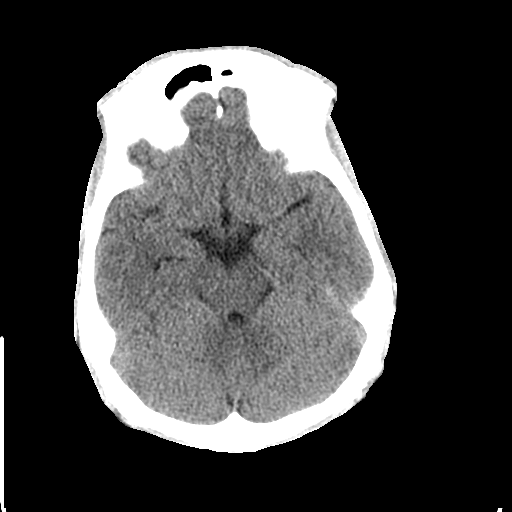
[im 11/28  brain]
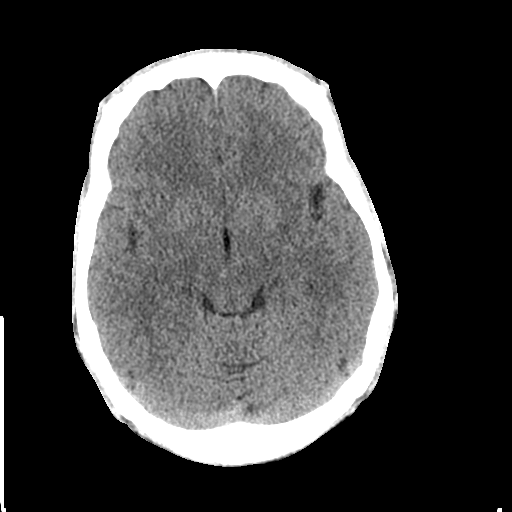
[im 14/28  brain]
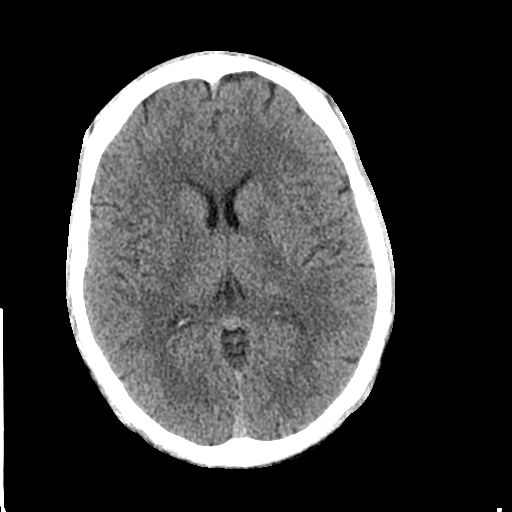
[im 14/28  bone]
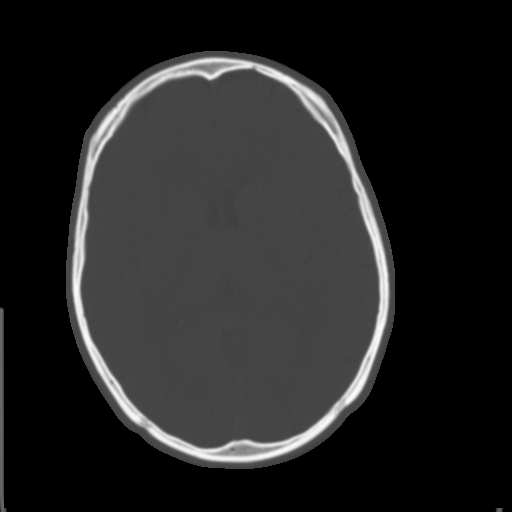
[im 17/28  brain]
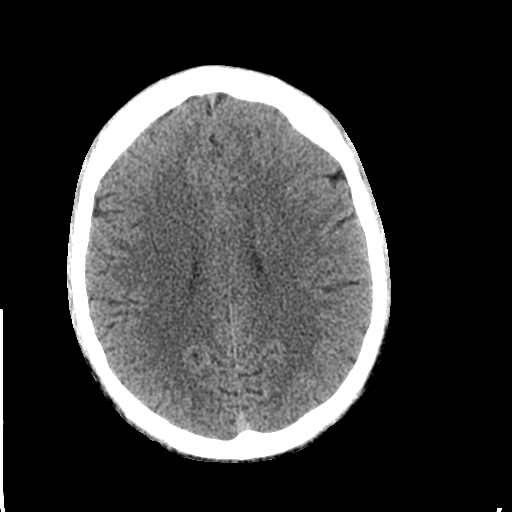
[im 19/28  brain]
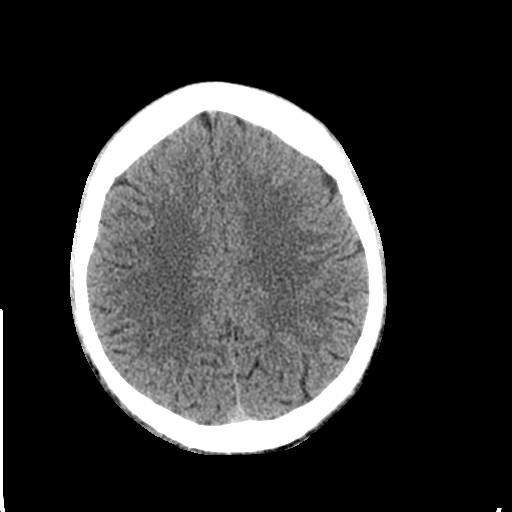
[im 22/28  brain]
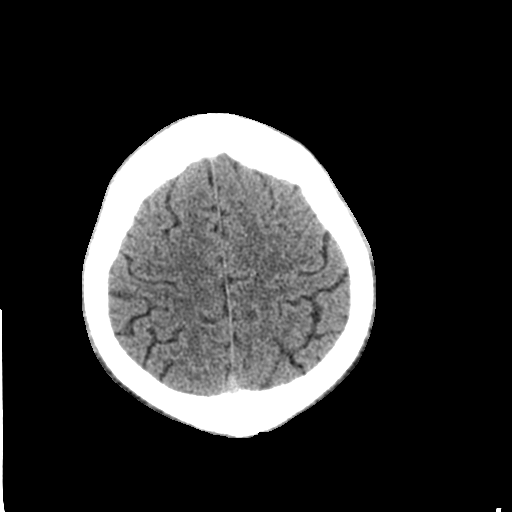
[im 25/28  brain]
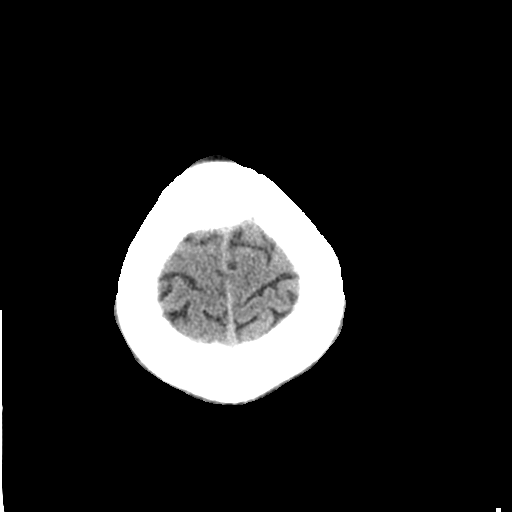
[im 25/28  bone]
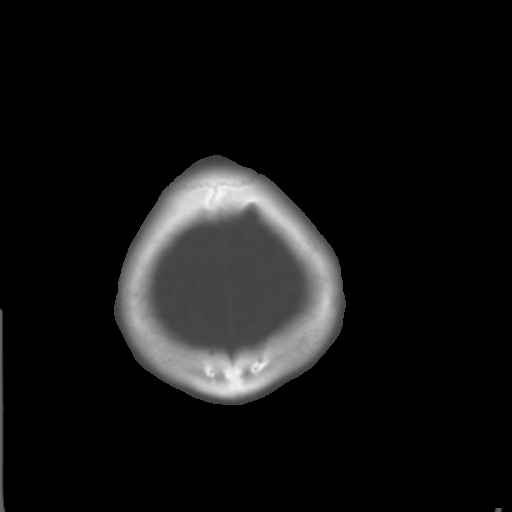

[Series 3: bone windows · axial · 0.45mm/px · z∈[-111,-6]mm · 8 of 47 slices shown]
[im 6/47  bone]
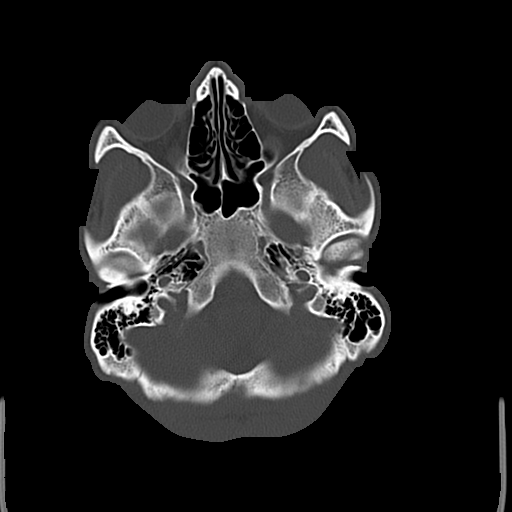
[im 11/47  bone]
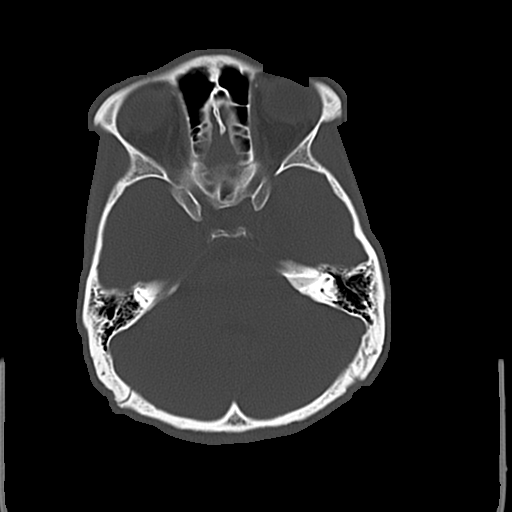
[im 16/47  bone]
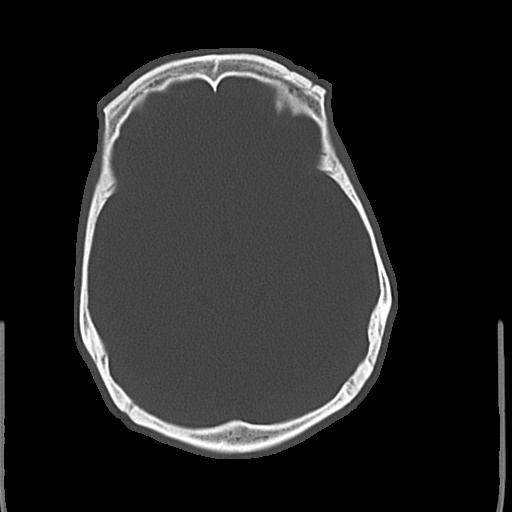
[im 21/47  bone]
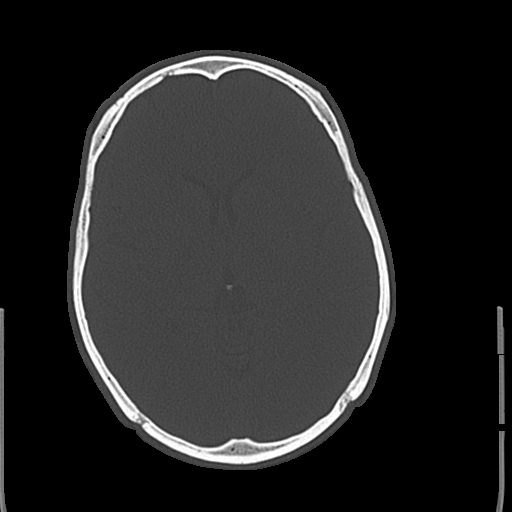
[im 26/47  bone]
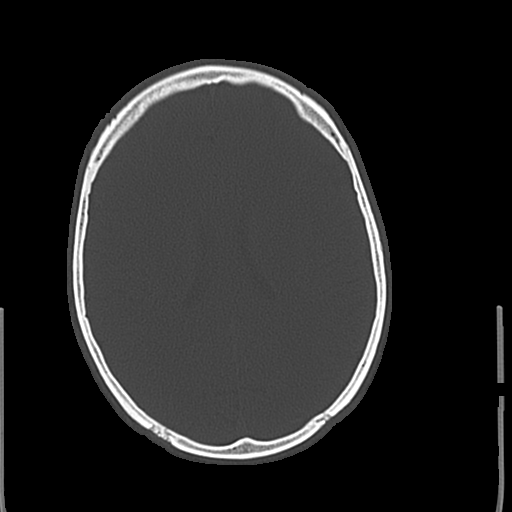
[im 31/47  bone]
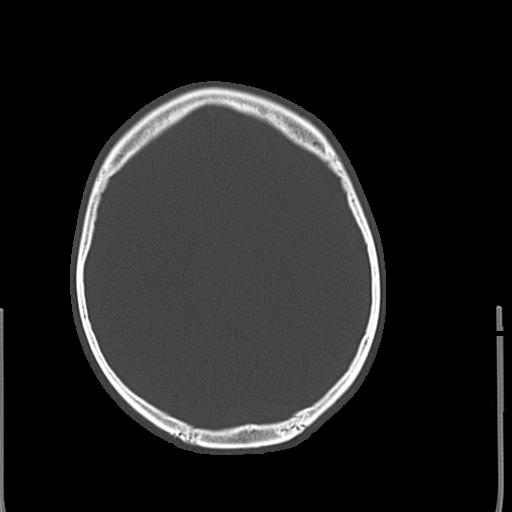
[im 36/47  bone]
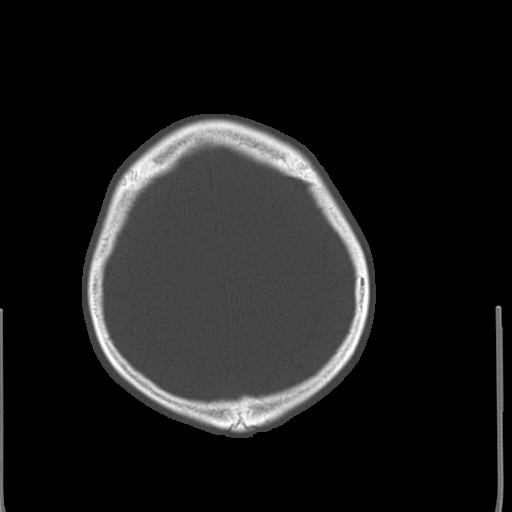
[im 41/47  bone]
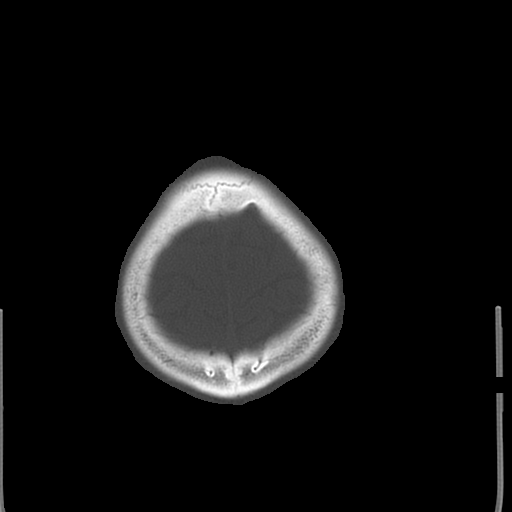

[17 of 30 positions shown; findings below may reference images not displayed]

FINDINGS: No intracranial abnormalities are identified, including mass lesion
or mass effect, hydrocephalus, extra-axial fluid collection, midline
shift, hemorrhage, or acute infarction.

The visualized bony calvarium is unremarkable.
IMPRESSION: Unremarkable noncontrast head CT.

## 2016-10-08 ENCOUNTER — Encounter (HOSPITAL_COMMUNITY): Payer: Self-pay | Admitting: Emergency Medicine

## 2016-10-08 ENCOUNTER — Emergency Department (HOSPITAL_COMMUNITY)
Admission: EM | Admit: 2016-10-08 | Discharge: 2016-10-09 | Disposition: A | Discharge status: Home / Self Care | Payer: Self-pay

## 2016-10-08 DIAGNOSIS — F250 Schizoaffective disorder, bipolar type: Secondary | ICD-10-CM | POA: Insufficient documentation

## 2016-10-08 DIAGNOSIS — Z79899 Other long term (current) drug therapy: Secondary | ICD-10-CM | POA: Insufficient documentation

## 2016-10-08 DIAGNOSIS — F259 Schizoaffective disorder, unspecified: Secondary | ICD-10-CM | POA: Diagnosis present

## 2016-10-08 DIAGNOSIS — F17200 Nicotine dependence, unspecified, uncomplicated: Secondary | ICD-10-CM | POA: Insufficient documentation

## 2016-10-08 LAB — CBC WITH DIFFERENTIAL/PLATELET
Basophils Absolute: 0 10*3/uL (ref 0.0–0.1)
Basophils Relative: 0 %
Eosinophils Absolute: 0.1 10*3/uL (ref 0.0–0.7)
Eosinophils Relative: 1 %
HCT: 37.9 % — ABNORMAL LOW (ref 39.0–52.0)
Hemoglobin: 12.4 g/dL — ABNORMAL LOW (ref 13.0–17.0)
Lymphocytes Relative: 21 %
Lymphs Abs: 2 10*3/uL (ref 0.7–4.0)
MCH: 28.4 pg (ref 26.0–34.0)
MCHC: 32.7 g/dL (ref 30.0–36.0)
MCV: 86.9 fL (ref 78.0–100.0)
Monocytes Absolute: 0.8 10*3/uL (ref 0.1–1.0)
Monocytes Relative: 9 %
Neutro Abs: 6.8 10*3/uL (ref 1.7–7.7)
Neutrophils Relative %: 69 %
Platelets: 200 10*3/uL (ref 150–400)
RBC: 4.36 MIL/uL (ref 4.22–5.81)
RDW: 13.2 % (ref 11.5–15.5)
WBC: 9.7 10*3/uL (ref 4.0–10.5)

## 2016-10-08 LAB — COMPREHENSIVE METABOLIC PANEL
ALT: 17 U/L (ref 17–63)
AST: 22 U/L (ref 15–41)
Albumin: 4.9 g/dL (ref 3.5–5.0)
Alkaline Phosphatase: 55 U/L (ref 38–126)
Anion gap: 9 (ref 5–15)
BUN: 12 mg/dL (ref 6–20)
CO2: 24 mmol/L (ref 22–32)
Calcium: 9.5 mg/dL (ref 8.9–10.3)
Chloride: 107 mmol/L (ref 101–111)
Creatinine, Ser: 0.87 mg/dL (ref 0.61–1.24)
GFR calc Af Amer: >60 mL/min (ref >60)
GFR calc non Af Amer: >60 mL/min (ref >60)
Glucose, Bld: 92 mg/dL (ref 65–99)
Potassium: 3.9 mmol/L (ref 3.5–5.1)
Sodium: 140 mmol/L (ref 135–145)
Total Bilirubin: 2 mg/dL — ABNORMAL HIGH (ref 0.3–1.2)
Total Protein: 7.7 g/dL (ref 6.5–8.1)

## 2016-10-08 LAB — RAPID URINE DRUG SCREEN, HOSP PERFORMED
Amphetamines: NOT DETECTED
Barbiturates: NOT DETECTED
Benzodiazepines: NOT DETECTED
Cocaine: NOT DETECTED
Opiates: NOT DETECTED
Tetrahydrocannabinol: POSITIVE — AB

## 2016-10-08 LAB — ETHANOL: Alcohol, Ethyl (B): <5 mg/dL (ref <5)

## 2016-10-08 MED ORDER — ZIPRASIDONE MESYLATE 20 MG IM SOLR
INTRAMUSCULAR | Status: AC
  Administered 2016-10-08: 20 mg via INTRAMUSCULAR
  Filled 2016-10-08: qty 20

## 2016-10-08 MED ORDER — DIPHENHYDRAMINE HCL 50 MG/ML IJ SOLN
INTRAMUSCULAR | Status: AC
  Administered 2016-10-08: 17:00:00 via INTRAMUSCULAR
  Filled 2016-10-08: qty 1

## 2016-10-08 MED ORDER — DIVALPROEX SODIUM 500 MG PO DR TAB
500.0000 mg | DELAYED_RELEASE_TABLET | Freq: Two times a day (BID) | ORAL | Status: DC
  Administered 2016-10-09: 500 mg via ORAL
  Filled 2016-10-08: qty 1

## 2016-10-08 MED ORDER — LORAZEPAM 2 MG/ML IJ SOLN
INTRAMUSCULAR | Status: AC
  Administered 2016-10-08: 17:00:00 via INTRAMUSCULAR
  Filled 2016-10-08: qty 1

## 2016-10-08 MED ORDER — TRAZODONE HCL 100 MG PO TABS: 100.0000 mg | ORAL_TABLET | Freq: Every evening | ORAL | Status: DC | PRN

## 2016-10-08 NOTE — ED Provider Notes (Signed)
5:26 PM Pt physically and then chemically restrained earlier. On my exam he is curled up and sleeping on his R side.  Snoring softly. No distress. Good air movement. Heart RRR. Superficial abrasion to R anterior shoulder.    Raeford Razor, MD 10/08/16 1728

## 2016-10-08 NOTE — ED Notes (Signed)
Pt sedated and sleeping at present, no distress noted, calm at present.  Monitoring for safety, Q 15 min checks in effect.

## 2016-10-08 NOTE — BH Assessment (Addendum)
Patient sedated and chemically restrained. He is unable to be aroused this time to complete at TTS consult. Writer informed EDP (Dr. Raeford Razor) and he agrees to remove the TTS consult. Requested nursing staff to place another TTS order once patient is awaken from sedation and is able to be assessed.

## 2016-10-08 NOTE — ED Triage Notes (Signed)
Per [employer]-patient was in the [employer] parking lot going thru trash and talking to himself-states when [employer] approached him he was not making sense-[employer] and manager of restaurant asked him to leave-patient then proceeded to walk out in front of a truck-

## 2016-10-08 NOTE — ED Provider Notes (Signed)
[facility]-EMERGENCY DEPT Provider Note   CSN: [id number] Arrival date & time: 10/08/16  1405  By signing my name below, I, Teofilo Pod, attest that this documentation has been prepared under the direction and in the presence of [employer], PA-C. Electronically Signed: Teofilo Pod, ED Scribe. 10/08/2016. 2:39 PM.    History   Chief Complaint Chief Complaint  Patient presents with  . Medical Clearance   LEVEL 5 CAVEAT DUE TO PSYCHIATRIC CONDITION  The history is provided by the patient. No language interpreter was used.   HPI Comments:  DIMETRIUS [name] is a 26 y.o. male who presents to the Emergency Department accompanied by [employer], here for medical clearance. Per [employer], pt was found at [employer] looking through the trash in the parking lot talking to himself. Pt then ran out in front of a truck on [facility]. Pt unable to provide a history.    Past Medical History:  Diagnosis Date  . Anxiety   . Mood disorder (HCC)   . Schizophrenia [facility])     Patient Active Problem List   Diagnosis Date Noted  . Cannabis use disorder, severe, dependence (HCC) 12/11/2014  . Schizoaffective disorder, bipolar type (HCC) 11/05/2014    History reviewed. No pertinent surgical history.     Home Medications    Prior to Admission medications   Medication Sig Start Date End Date Taking? Authorizing Provider  divalproex (DEPAKOTE) 500 MG DR tablet Take 1 tablet (500 mg total) by mouth every 12 (twelve) hours. Patient not taking: Reported on 04/17/2016 12/19/14   Shuvon B Rankin, NP  metroNIDAZOLE (FLAGYL) 500 MG tablet Take 4 tablets (2,000 mg total) by mouth once. Patient not taking: Reported on 04/17/2016 09/23/15   Bethel Born, PA-C  traZODone (DESYREL) 100 MG tablet Take 1 tablet (100 mg total) by mouth at bedtime as needed for sleep. Patient not taking: Reported on 04/17/2016 12/19/14   Talmage Nap, NP    Family History No family history on file.  Social History Social  History  Substance Use Topics  . Smoking status: Smoker, Current Status Unknown  . Smokeless tobacco: Never Used  . Alcohol use Yes     Allergies   Penicillins   Review of Systems Review of Systems  Unable to perform ROS: Psychiatric disorder     Physical Exam Updated Vital Signs There were no vitals taken for this visit.  Physical Exam  Constitutional: He appears well-developed and well-nourished. No distress.  HENT:  Head: Normocephalic and atraumatic.  Eyes: Conjunctivae are normal.  Cardiovascular: Normal rate.   Pulmonary/Chest: Effort normal.  Abdominal: He exhibits no distension.  Neurological: He is alert.  Patient rambling with no coherent line of thought  Skin: Skin is warm and dry.  Nursing note and vitals reviewed.    ED Treatments / Results  Labs (all labs ordered are listed, but only abnormal results are displayed) Labs Reviewed  COMPREHENSIVE METABOLIC PANEL - Abnormal; Notable for the following:       Result Value   Total Bilirubin 2.0 (*)    All other components within normal limits  CBC WITH DIFFERENTIAL/PLATELET - Abnormal; Notable for the following:    Hemoglobin 12.4 (*)    HCT 37.9 (*)    All other components within normal limits  RAPID URINE DRUG SCREEN, HOSP PERFORMED - Abnormal; Notable for the following:    Tetrahydrocannabinol POSITIVE (*)    All other components within normal limits  ETHANOL    EKG  EKG Interpretation None       Radiology No results found.  Procedures Procedures (including critical care time)  Medications Ordered in ED Medications - No data to display   Initial Impression / Assessment and Plan / ED Course  I have reviewed the triage vital signs and the nursing notes.  Pertinent labs & imaging results that were available during my care of the patient were reviewed by me and considered in my medical decision making (see chart for details).       Final Clinical Impressions(s) / ED Diagnoses    Final diagnoses:  Schizophrenia, unspecified type (HCC)   Labs: Psych clearance labs THC positive  Consults: TTS    Assessment/Plan:    26 year old male presents today with obvious schizophrenic features.  Unable to provide any significant information.  He does not appear to be any acute distress or displaying any signs of infection.  Patient's labs were completed, with no significant abnormalities.  Patient is medically cleared and awaiting TTS consult.   New Prescriptions New Prescriptions   No medications on file  I personally performed the services described in this documentation, which was scribed in my presence. The recorded information has been reviewed and is accurate.     Eyvonne Mechanic, PA-C 10/08/16 1618    Raeford Razor, MD 10/09/16 [phone]

## 2016-10-08 NOTE — ED Notes (Signed)
Bed: [id number] Expected date:  Expected time:  Means of arrival:  Comments: Triage 6 

## 2016-10-09 NOTE — Discharge Instructions (Signed)
For your ongoing mental health needs, you are advised to follow up with [facility].  If you do not currently have an appointment, new and returning patients are seen at their walk-in clinic.  Walk-in hours are Monday - Friday from 8:00 am - 3:00 pm.  Walk-in patients are seen on a first come, first served basis.  Try to arrive as early as possible for he best chance of being seen the same day: ° °     [facility] °     [address] °     [place], [place] [address] °     [phone] °

## 2016-10-09 NOTE — BHH Counselor (Signed)
Clinician checked in with Latricia, RN and noted pt is still sedated.    Gwinda Passe, MS, [facility], [facility] Triage Specialist [phone]

## 2016-10-09 NOTE — ED Notes (Signed)
Pt discharged home. Discharged instructions read to pt who verbalized understanding. All belongings returned to pt who signed for same. Denies SI/HI, is not delusional and not responding to internal stimuli. Escorted pt to the ED exit.  Pt given bus pass. 

## 2016-10-09 NOTE — BH Assessment (Signed)
BHH Assessment Progress Note  Per Thedore Mins, MD, this pt does not require psychiatric hospitalization at this time.  Pt presents under IVC initiated by law enforcement, which Dr Jannifer Franklin has rescinded.  Pt is to be discharged from [facility] with recommendation to follow up with [facility].  This has been included in pt's discharge instructions.  Pt's nurse, Diane, has been notified.  Doylene Canning, MA Triage Specialist [phone]

## 2016-10-09 NOTE — BH Assessment (Addendum)
Assessment Note  Cody [name] is an 26 y.o. male that denies SI/HI/Psychosis/Substance Abuse.  Patient reports that he lives with family members.    During the assessment the patient did not want to answer most of the questions.  Patient continued to state that he does not want to  harm himself or anyone else.   Patient was calm and cooperative during the assessment.     Diagnosis: Schizophrenia  Past Medical History:  Past Medical History:  Diagnosis Date  . Anxiety   . Mood disorder (HCC)   . Schizophrenia (HCC)     History reviewed. No pertinent surgical history.  Family History: No family history on file.  Social History:  reports that he has been smoking.  He has never used smokeless tobacco. He reports that he drinks alcohol. He reports that he uses drugs, including Marijuana.  Additional Social History:  Alcohol / Drug Use History of alcohol / drug use?: No history of alcohol / drug abuse  CIWA: CIWA-Ar BP: 102/60 Pulse Rate: (!) 118 (nurse notfied) COWS:    Allergies:  Allergies  Allergen Reactions  . Penicillins Hives    Has patient had a PCN reaction causing immediate rash, facial/tongue/throat swelling, SOB or lightheadedness with hypotension: Unknown, childhood allergy Has patient had a PCN reaction causing severe rash involving mucus membranes or skin necrosis: No Has patient had a PCN reaction that required hospitalization No Has patient had a PCN reaction occurring within the last 10 years: No If all of the above answers are "NO", then may proceed with Cephalosporin use.     Home Medications:  (Not in a hospital admission)  OB/GYN Status:  No LMP for male patient.  General Assessment Data Location of Assessment: [facility] ED TTS Assessment: In system Is this a Tele or Face-to-Face Assessment?: Face-to-Face Is this an Initial Assessment or a Re-assessment for this encounter?: Initial Assessment Marital status: Single Maiden name: NA  Is patient  pregnant?: No Pregnancy Status: No Living Arrangements: Non-relatives/Friends Can pt return to current living arrangement?: Yes Admission Status: Voluntary Is patient capable of signing voluntary admission?: Yes Referral Source: Self/Family/Friend Insurance type: Self Pay   Medical Screening Exam [facility] Walk-in ONLY) Medical Exam completed: Yes  Crisis Care Plan Living Arrangements: Non-relatives/Friends Legal Guardian:  (NA) Name of Psychiatrist: None Reported Name of Therapist: None Reported  Education Status Is patient currently in school?: No Current Grade: NA Highest grade of school patient has completed: NA Name of school: NA Contact person: NA  Risk to self with the past 6 months Suicidal Ideation: No Has patient been a risk to self within the past 6 months prior to admission? : No Suicidal Intent: No Has patient had any suicidal intent within the past 6 months prior to admission? : No Is patient at risk for suicide?: No Suicidal Plan?: No Has patient had any suicidal plan within the past 6 months prior to admission? : No Access to Means: No What has been your use of drugs/alcohol within the last 12 months?: NA Previous Attempts/Gestures: No How many times?: 0 Other Self Harm Risks: NA Triggers for Past Attempts:  (NA) Intentional Self Injurious Behavior: None Family Suicide History: No Recent stressful life event(s): Job Loss, Financial Problems Persecutory voices/beliefs?: No Depression: Yes Depression Symptoms: Guilt, Feeling worthless/self pity Substance abuse history and/or treatment for substance abuse?: No Suicide prevention information given to non-admitted patients: Not applicable  Risk to Others within the past 6 months Homicidal Ideation: No Does patient have any  lifetime risk of violence toward others beyond the six months prior to admission? : No Thoughts of Harm to Others: No Current Homicidal Intent: No Current Homicidal Plan: No Access to  Homicidal Means: No Identified Victim: None Reported History of harm to others?: No Assessment of Violence: None Noted Violent Behavior Description: None Reported Does patient have access to weapons?: No Criminal Charges Pending?: No Does patient have a court date: No Is patient on probation?: No  Psychosis Hallucinations: None noted Delusions: None noted  Mental Status Report Appearance/Hygiene: Disheveled Eye Contact: Good Motor Activity: Freedom of movement Speech: Logical/coherent Level of Consciousness: Alert Mood: Depressed Affect: Anxious, Appropriate to circumstance Anxiety Level: None Thought Processes: Coherent, Relevant Judgement: Unimpaired Orientation: Person, Place, Time, Situation Obsessive Compulsive Thoughts/Behaviors: None  Cognitive Functioning Concentration: Decreased Memory: Recent Intact, Remote Intact IQ: Average Insight: Fair Impulse Control: Poor Appetite: Fair Weight Loss: 0 Weight Gain: 0 Sleep: No Change Total Hours of Sleep: 8 Vegetative Symptoms: None  ADLScreening [facility] Assessment Services) Patient's cognitive ability adequate to safely complete daily activities?: Yes Patient able to express need for assistance with ADLs?: Yes Independently performs ADLs?: Yes (appropriate for developmental age)  Prior Inpatient Therapy Prior Inpatient Therapy: No Prior Therapy Dates: Unable to remember Prior Therapy Facilty/Provider(s): Unable to remember the name Reason for Treatment: Depression   Prior Outpatient Therapy Prior Outpatient Therapy: No Prior Therapy Dates: NA Prior Therapy Facilty/Provider(s): NA Reason for Treatment: NA Does patient have an ACCT team?: No Does patient have Intensive In-House Services?  : No Does patient have [facility] services? : No Does patient have P4CC services?: No  ADL Screening (condition at time of admission) Patient's cognitive ability adequate to safely complete daily activities?: Yes Is the patient  deaf or have difficulty hearing?: No Does the patient have difficulty seeing, even when wearing glasses/contacts?: No Does the patient have difficulty concentrating, remembering, or making decisions?: No Patient able to express need for assistance with ADLs?: Yes Does the patient have difficulty dressing or bathing?: No Independently performs ADLs?: Yes (appropriate for developmental age) Does the patient have difficulty walking or climbing stairs?: No Weakness of Legs: None Weakness of Arms/Hands: None  Home Assistive Devices/Equipment Home Assistive Devices/Equipment: None    Abuse/Neglect Assessment (Assessment to be complete while patient is alone) Physical Abuse: Denies Verbal Abuse: Denies Sexual Abuse: Denies Exploitation of patient/patient's resources: Denies Self-Neglect: Denies Values / Beliefs Cultural Requests During Hospitalization: None Spiritual Requests During Hospitalization: None Consults Spiritual Care Consult Needed: No Social Work Consult Needed: No Merchant navy officer (For Healthcare) Does Patient Have a Medical Advance Directive?: No Would patient like information on creating a medical advance directive?: No - Patient declined    Additional Information 1:1 In Past 12 Months?: No CIRT Risk: No Elopement Risk: No Does patient have medical clearance?: Yes     Disposition: Per Dr. Jannifer Franklin - patient does not meet criteria for inpatient hospitalization.  Patient referred to [facility] for outpatient resources.  Disposition Initial Assessment Completed for this Encounter: Yes Disposition of Patient: Outpatient treatment  On Site Evaluation by:   Reviewed with Physician:    Phillip Heal LaVerne 10/09/2016 10:27 AM

## 2016-10-09 NOTE — BHH Suicide Risk Assessment (Signed)
Suicide Risk Assessment  Discharge Assessment   [facility] Discharge Suicide Risk Assessment   Principal Problem: Schizoaffective disorder, bipolar type [facility]) Discharge Diagnoses:  Patient Active Problem List   Diagnosis Date Noted  . Schizoaffective disorder, bipolar type (HCC) [F25.0] 11/05/2014    Priority: High  . Cannabis use disorder, severe, dependence (HCC) [F12.20] 12/11/2014    Total Time spent with patient: 45 minutes  Musculoskeletal: Strength & Muscle Tone: within normal limits Gait & Station: normal Patient leans: N/A  Psychiatric Specialty Exam:   Blood pressure 102/60, pulse (!) 118, temperature 98.4 F (36.9 C), temperature source Oral, resp. rate 17, SpO2 99 %.There is no height or weight on file to calculate BMI.  General Appearance: Casual  Eye Contact::  Good  Speech:  Normal Rate409  Volume:  Normal  Mood:  Euthymic  Affect:  Congruent  Thought Process:  Coherent and Descriptions of Associations: Intact  Orientation:  Full (Time, Place, and Person)  Thought Content:  WDL and Logical  Suicidal Thoughts:  No  Homicidal Thoughts:  No  Memory:  Immediate;   Good Recent;   Good Remote;   Good  Judgement:  Fair  Insight:  Fair  Psychomotor Activity:  Normal  Concentration:  Good  Recall:  Good  Fund of Knowledge:  Fair  Language: Good  Akathisia:  No  Handed:  Right  AIMS (if indicated):     Assets:  Leisure Time Physical Health Resilience  Sleep:     Cognition: WNL  ADL's:  Intact   Mental Status Per Nursing Assessment::   On Admission:   Brought in by police after he was being chased for dumpster diving and running in the street.  Denies this was a suicide attempt.  No suicidal/homicidal ideations, hallucinations, or alcohol/drug abuse.  STable for discharge and he wants to leave.  Demographic Factors:  Male and Adolescent or young adult  Loss Factors: NA  Historical Factors: Impulsivity  Risk Reduction Factors:   Sense of responsibility  to family  Continued Clinical Symptoms:  None  Cognitive Features That Contribute To Risk:  None    Suicide Risk:  Minimal: No identifiable suicidal ideation.  Patients presenting with no risk factors but with morbid ruminations; may be classified as minimal risk based on the severity of the depressive symptoms    Plan Of Care/Follow-up recommendations:  Activity:  as tolerated Diet:  heart healthy diet  LORD, JAMISON, NP 10/09/2016, 9:56 AM

## 2016-10-09 NOTE — BHH Counselor (Signed)
Per [place], RN pt is roused.    Gwinda Passe, MS, [facility], [facility] Triage Specialist [phone]

## 2016-10-13 ENCOUNTER — Encounter (HOSPITAL_COMMUNITY): Payer: Self-pay

## 2016-10-13 ENCOUNTER — Emergency Department (HOSPITAL_COMMUNITY)
Admission: EM | Admit: 2016-10-13 | Discharge: 2016-10-15 | Disposition: A | Discharge status: Home / Self Care | Payer: Federal, State, Local not specified - Other | Attending: Emergency Medicine | Admitting: Emergency Medicine

## 2016-10-13 DIAGNOSIS — F259 Schizoaffective disorder, unspecified: Secondary | ICD-10-CM | POA: Diagnosis present

## 2016-10-13 DIAGNOSIS — F29 Unspecified psychosis not due to a substance or known physiological condition: Secondary | ICD-10-CM | POA: Insufficient documentation

## 2016-10-13 DIAGNOSIS — F250 Schizoaffective disorder, bipolar type: Secondary | ICD-10-CM | POA: Insufficient documentation

## 2016-10-13 DIAGNOSIS — F1221 Cannabis dependence, in remission: Secondary | ICD-10-CM | POA: Diagnosis present

## 2016-10-13 DIAGNOSIS — F17200 Nicotine dependence, unspecified, uncomplicated: Secondary | ICD-10-CM | POA: Insufficient documentation

## 2016-10-13 DIAGNOSIS — F1220 Cannabis dependence, uncomplicated: Secondary | ICD-10-CM | POA: Diagnosis present

## 2016-10-13 DIAGNOSIS — F1290 Cannabis use, unspecified, uncomplicated: Secondary | ICD-10-CM | POA: Diagnosis present

## 2016-10-13 DIAGNOSIS — F23 Brief psychotic disorder: Secondary | ICD-10-CM

## 2016-10-13 DIAGNOSIS — Z79899 Other long term (current) drug therapy: Secondary | ICD-10-CM | POA: Insufficient documentation

## 2016-10-13 LAB — COMPREHENSIVE METABOLIC PANEL
ALBUMIN: 4.7 g/dL (ref 3.5–5.0)
ALT: 14 U/L — ABNORMAL LOW (ref 17–63)
ANION GAP: 7 (ref 5–15)
AST: 25 U/L (ref 15–41)
Alkaline Phosphatase: 52 U/L (ref 38–126)
BUN: 11 mg/dL (ref 6–20)
CHLORIDE: 102 mmol/L (ref 101–111)
CO2: 27 mmol/L (ref 22–32)
Calcium: 9.4 mg/dL (ref 8.9–10.3)
Creatinine, Ser: 0.87 mg/dL (ref 0.61–1.24)
GFR calc Af Amer: >60 mL/min (ref >60)
Glucose, Bld: 90 mg/dL (ref 65–99)
POTASSIUM: 4.1 mmol/L (ref 3.5–5.1)
Sodium: 136 mmol/L (ref 135–145)
Total Bilirubin: 1.3 mg/dL — ABNORMAL HIGH (ref 0.3–1.2)
Total Protein: 7.2 g/dL (ref 6.5–8.1)

## 2016-10-13 LAB — RAPID URINE DRUG SCREEN, HOSP PERFORMED
AMPHETAMINES: NOT DETECTED
BENZODIAZEPINES: NOT DETECTED
Barbiturates: NOT DETECTED
COCAINE: NOT DETECTED
OPIATES: NOT DETECTED
Tetrahydrocannabinol: POSITIVE — AB

## 2016-10-13 LAB — SALICYLATE LEVEL: Salicylate Lvl: <7 mg/dL (ref 2.8–30.0)

## 2016-10-13 LAB — CBC
HCT: 38.9 % — ABNORMAL LOW (ref 39.0–52.0)
Hemoglobin: 12.8 g/dL — ABNORMAL LOW (ref 13.0–17.0)
MCH: 28.6 pg (ref 26.0–34.0)
MCHC: 32.9 g/dL (ref 30.0–36.0)
MCV: 87 fL (ref 78.0–100.0)
PLATELETS: 222 10*3/uL (ref 150–400)
RBC: 4.47 MIL/uL (ref 4.22–5.81)
RDW: 13 % (ref 11.5–15.5)
WBC: 6.5 10*3/uL (ref 4.0–10.5)

## 2016-10-13 LAB — ACETAMINOPHEN LEVEL

## 2016-10-13 LAB — ETHANOL

## 2016-10-13 MED ORDER — ACETAMINOPHEN 325 MG PO TABS: 650.0000 mg | ORAL_TABLET | ORAL | Status: DC | PRN

## 2016-10-13 MED ORDER — STERILE WATER FOR INJECTION IJ SOLN
INTRAMUSCULAR | Status: AC
  Administered 2016-10-13: 20:00:00
  Filled 2016-10-13: qty 10

## 2016-10-13 MED ORDER — LORAZEPAM 1 MG PO TABS
1.0000 mg | ORAL_TABLET | Freq: Three times a day (TID) | ORAL | Status: DC | PRN
  Administered 2016-10-14: 1 mg via ORAL
  Filled 2016-10-13 (×2): qty 1

## 2016-10-13 MED ORDER — ZIPRASIDONE MESYLATE 20 MG IM SOLR
10.0000 mg | Freq: Once | INTRAMUSCULAR | Status: AC
  Administered 2016-10-13: 10 mg via INTRAMUSCULAR
  Filled 2016-10-13: qty 20

## 2016-10-13 MED ORDER — ONDANSETRON HCL 4 MG PO TABS: 4.0000 mg | ORAL_TABLET | Freq: Three times a day (TID) | ORAL | Status: DC | PRN

## 2016-10-13 NOTE — ED Notes (Addendum)
Patient continuing to exhibit bizarre behavior.  Craning his neck to see things that aren't there, making strange faces, and talking to himself.

## 2016-10-13 NOTE — Progress Notes (Signed)
Per Donell Sievert PA meets inpatient criteria Elsie Lincoln. Sherlon Handing, LPC-A, [facility]  Counselor 10/13/2016 10:57 PM

## 2016-10-13 NOTE — ED Notes (Signed)
Gave report to [place], Charity fundraiser in [place]. Pt is being ambulating to [facility]. Pt's belongings are going with patient to [facility] room 42.

## 2016-10-13 NOTE — ED Notes (Signed)
Patient has become arousable to obtain urine sample.

## 2016-10-13 NOTE — ED Notes (Signed)
TTS came to evaluate patient, he aroused to inform counselor that he agrees to the evaluation. Then, patient didn't want to wake up enough to have evaluation performed. Will wait later to perform evaluation once he is more active and alert.

## 2016-10-13 NOTE — Progress Notes (Signed)
Attempted TTS, pt was not responsive. Per pt. Rn pt did receive Geodone around 6:00 p.m. Today. Consult to be re-submitted when pt. Is alert and oriented/responsive. Parveen Freehling K. Sherlon Handing, LPC-A, [facility]  Counselor 10/13/2016 8:28 PM

## 2016-10-13 NOTE — ED Notes (Signed)
Pt BIB [employer] d/t erratic behavior. They state that they found him outside staring in to space, trying to fight cars in oncoming traffic, and trying to urinate into his own mouth. Ambulatory and in police custody.

## 2016-10-13 NOTE — ED Notes (Addendum)
Patient placed in room 42. Patient is alert and ambulatory but minimal with Clinical research associate. Patient is observed laughing inappropriately and appears to be responding to internal stimuli. Patient denies SI/HI/AVH or pain. Belongings checked and placed into locker 42. Written consent obtained. Snacks and fluids provided. Patient resting in bed. Patient safety maintained with q15 minute safety checks and hourly rounding. No medications given at this time. Will continue to monitor.

## 2016-10-13 NOTE — ED Notes (Signed)
Pt being IVC'd 

## 2016-10-13 NOTE — BH Assessment (Signed)
Tele Assessment Note   Cody [name] is an 26 y.o. male, Who reports to [facility] ED per ED report: history of schizoaffective disorder, bipolar type. Department by police after resisting arrest, exposing himself and then urinating into his mouth. This point he ran in to traffic. He was apprehended. He is unable to give account of events prior to coming to the emergency department. Patient was not alert or oriented, but per Rn report Denies SI/HI. Patient is currently responding to internal stimuli and mumbling incoherently with apparent AVH. Patient does have hx. Of S.A. With marijuana last use UTA. Patient has hx. Of inpatient psych care with last 04/2016 at [facility] for SI, schizophrenia and 10/2014 [facility] for schizophrenia/ SI. UTA current outpatient psych care. Patient is staying under hospital blanket ,and is not alert or oriented. Patient speech was UTA mumbling/ incoherent at times and motor behavior appeared normal. Patient thought process is UTA. Patient does  appear to be responding to internal stimuli.     Diagnosis: Schizoaffective Disorder, Bipolar Type  Past Medical History:  Past Medical History:  Diagnosis Date  . Anxiety   . Mood disorder (HCC)   . Schizophrenia (HCC)     History reviewed. No pertinent surgical history.  Family History: History reviewed. No pertinent family history.  Social History:  reports that he has been smoking.  He has never used smokeless tobacco. He reports that he drinks alcohol. He reports that he uses drugs, including Marijuana.  Additional Social History:  Alcohol / Drug Use Pain Medications: SEE MAR Prescriptions: SEE MAR Over the Counter: SEE MAR History of alcohol / drug use?: Yes Longest period of sobriety (when/how long): unspecified Withdrawal Symptoms: Patient aware of relationship between substance abuse and physical/medical complications Substance #1 Name of Substance 1: Cannabis 1 - Age of First Use:  unspecified 1 - Amount (size/oz): unspecified 1 - Frequency: unspecified 1 - Duration: unspecified 1 - Last Use / Amount: unspecified  CIWA: CIWA-Ar BP: 96/61 Pulse Rate: 62 COWS:    PATIENT STRENGTHS: (choose at least two) Average or above average intelligence Other: unknown  Allergies:  Allergies  Allergen Reactions  . Penicillins Hives    Has patient had a PCN reaction causing immediate rash, facial/tongue/throat swelling, SOB or lightheadedness with hypotension: Unknown, childhood allergy Has patient had a PCN reaction causing severe rash involving mucus membranes or skin necrosis: No Has patient had a PCN reaction that required hospitalization No Has patient had a PCN reaction occurring within the last 10 years: No If all of the above answers are "NO", then may proceed with Cephalosporin use.     Home Medications:  (Not in a hospital admission)  OB/GYN Status:  No LMP for male patient.  General Assessment Data Location of Assessment: [facility] TTS Assessment: In system Is this a Tele or Face-to-Face Assessment?: Face-to-Face Is this an Initial Assessment or a Re-assessment for this encounter?: Initial Assessment Marital status: Single Maiden name: n/a Is patient pregnant?: No Pregnancy Status: No Living Arrangements: Other (Comment) (UTA) Can pt return to current living arrangement?: Yes Admission Status: Involuntary Is patient capable of signing voluntary admission?: Yes Referral Source: Other Insurance type: self pay     Crisis Care Plan Living Arrangements: Other (Comment) (UTA) Name of Psychiatrist: UTA Name of Therapist: UTA  Education Status Is patient currently in school?: No Current Grade: UTA Highest grade of school patient has completed: UTA Name of school: UTA Contact person: UTA  Risk to self with  the past 6 months Suicidal Ideation: No Has patient been a risk to self within the past 6 months prior to admission? : No Suicidal Intent:  No Has patient had any suicidal intent within the past 6 months prior to admission? : No Is patient at risk for suicide?: No Suicidal Plan?: No Has patient had any suicidal plan within the past 6 months prior to admission? : No Access to Means: No What has been your use of drugs/alcohol within the last 12 months?: marijuana Previous Attempts/Gestures: No How many times?: 0 Other Self Harm Risks: n/a Triggers for Past Attempts: Unknown Intentional Self Injurious Behavior: None Family Suicide History: No Recent stressful life event(s): Other (Comment) (UTA) Persecutory voices/beliefs?: No (UTA exact extent) Depression: Yes Depression Symptoms: Guilt, Loss of interest in usual pleasures, Feeling worthless/self pity, Feeling angry/irritable Substance abuse history and/or treatment for substance abuse?: Yes Suicide prevention information given to non-admitted patients: Not applicable  Risk to Others within the past 6 months Homicidal Ideation: No Does patient have any lifetime risk of violence toward others beyond the six months prior to admission? : No Thoughts of Harm to Others: No Current Homicidal Intent: No Current Homicidal Plan: No Access to Homicidal Means: No Identified Victim: none reported History of harm to others?: No Assessment of Violence: None Noted Violent Behavior Description: none reported Does patient have access to weapons?: No Criminal Charges Pending?: No Does patient have a court date: No Is patient on probation?: No  Psychosis Hallucinations: Auditory, Visual Delusions: Unspecified  Mental Status Report Appearance/Hygiene: Unable to Assess Eye Contact: Unable to Assess Motor Activity: Freedom of movement Speech: Unable to assess Level of Consciousness: Sedated Mood: Other (Comment) (UTA) Affect: Unable to Assess Anxiety Level: Moderate Thought Processes: Unable to Assess Judgement: Impaired Orientation: Unable to assess Obsessive Compulsive  Thoughts/Behaviors: Unable to Assess  Cognitive Functioning Concentration: Unable to Assess Memory: Unable to Assess IQ: Average Insight: Unable to Assess Impulse Control: Unable to Assess Appetite: Fair Weight Loss: 0 Weight Gain: 0 Sleep: Unable to Assess Vegetative Symptoms: Unable to Assess  ADLScreening [facility]) Patient's cognitive ability adequate to safely complete daily activities?: No Patient able to express need for assistance with ADLs?: Yes Independently performs ADLs?: Yes (appropriate for developmental age)  Prior Inpatient Therapy Prior Inpatient Therapy: Yes Prior Therapy Dates: multiple last 2017,2016 Prior Therapy Facilty/Provider(s): [facility], [facility] Reason for Treatment: depression  Prior Outpatient Therapy Prior Outpatient Therapy: No Prior Therapy Dates: UTA Prior Therapy Facilty/Provider(s): UTA Reason for Treatment: UTA Does patient have an ACCT team?: Unknown Does patient have Intensive In-House Services?  : Unknown Does patient have [facility] services? : Unknown Does patient have P4CC services?: Unknown  ADL Screening (condition at time of admission) Patient's cognitive ability adequate to safely complete daily activities?: No Is the patient deaf or have difficulty hearing?: No Does the patient have difficulty seeing, even when wearing glasses/contacts?: No Does the patient have difficulty concentrating, remembering, or making decisions?: Yes Patient able to express need for assistance with ADLs?: Yes Does the patient have difficulty dressing or bathing?: No Independently performs ADLs?: Yes (appropriate for developmental age) Does the patient have difficulty walking or climbing stairs?: No Weakness of Legs: None Weakness of Arms/Hands: None       Abuse/Neglect Assessment (Assessment to be complete while patient is alone) Physical Abuse: Denies (UTA) Verbal Abuse: Denies (UTA) Sexual Abuse: Denies  (UTA) Exploitation of patient/patient's resources: Denies Industrial/product designer) Self-Neglect: Denies Industrial/product designer)     Merchant navy officer (  For Healthcare) Does Patient Have a Medical Advance Directive?: No Would patient like information on creating a medical advance directive?: No - Patient declined    Additional Information 1:1 In Past 12 Months?: No CIRT Risk: No Elopement Risk: Yes Does patient have medical clearance?: Yes     Disposition: Per Donell Sievert, PA meets inpatient criteria Disposition Initial Assessment Completed for this Encounter: Yes Disposition of Patient: Other dispositions (TBD)  Hipolito Bayley 10/13/2016 10:47 PM

## 2016-10-13 NOTE — ED Provider Notes (Signed)
[facility]-EMERGENCY DEPT Provider Note   CSN: [id number] Arrival date & time: 10/13/16  1633     History   Chief Complaint Chief Complaint  Patient presents with  . Behavior Problem    HPI Cody [name] is a 26 y.o. male.  HPI Patient has history of schizoaffective disorder, bipolar type. Department by police after resisting arrest, exposing himself and then urinating into his mouth. This point he ran in to traffic. He was apprehended. He is unable to give account of events prior to coming to the emergency department. Level V caveat applies. Past Medical History:  Diagnosis Date  . Anxiety   . Mood disorder (HCC)   . Schizophrenia [facility])     Patient Active Problem List   Diagnosis Date Noted  . Cannabis use disorder, severe, dependence (HCC) 12/11/2014  . Schizoaffective disorder, bipolar type (HCC) 11/05/2014    History reviewed. No pertinent surgical history.     Home Medications    Prior to Admission medications   Medication Sig Start Date End Date Taking? Authorizing Provider  divalproex (DEPAKOTE) 500 MG DR tablet Take 1 tablet (500 mg total) by mouth every 12 (twelve) hours. Patient not taking: Reported on 04/17/2016 12/19/14   Shuvon B Rankin, NP  metroNIDAZOLE (FLAGYL) 500 MG tablet Take 4 tablets (2,000 mg total) by mouth once. Patient not taking: Reported on 04/17/2016 09/23/15   Bethel Born, PA-C  traZODone (DESYREL) 100 MG tablet Take 1 tablet (100 mg total) by mouth at bedtime as needed for sleep. Patient not taking: Reported on 04/17/2016 12/19/14   Talmage Nap, NP    Family History History reviewed. No pertinent family history.  Social History Social History  Substance Use Topics  . Smoking status: Smoker, Current Status Unknown  . Smokeless tobacco: Never Used  . Alcohol use Yes     Allergies   Penicillins   Review of Systems Review of Systems  Unable to perform ROS: Psychiatric disorder     Physical Exam Updated Vital Signs BP  136/80 (BP Location: Right Arm)   Pulse 88   Temp 98.2 F (36.8 C) (Axillary)   Resp 16   SpO2 100%   Physical Exam  Constitutional: He appears well-developed and well-nourished.  HENT:  Head: Normocephalic and atraumatic.  Mouth/Throat: Oropharynx is clear and moist.  No evidence of trauma  Eyes: EOM are normal. Pupils are equal, round, and reactive to light.  5+ pupils bilaterally  Neck: Normal range of motion. Neck supple.  No posterior midline cervical tenderness to palpation. No meningismus.  Cardiovascular: Regular rhythm.   Tachycardia  Pulmonary/Chest: Effort normal and breath sounds normal.  Abdominal: Soft. Bowel sounds are normal. There is no tenderness. There is no rebound and no guarding.  Musculoskeletal: Normal range of motion. He exhibits no edema or tenderness.  No midline thoracic or lumbar tenderness.  Neurological: He is alert.  Moving all extremities without focal deficit. Sensation intact. No tremor appreciated. Oriented to self.  Skin: Skin is warm and dry. No rash noted. No erythema.  Psychiatric:  Hyperactive, pressured speech, appears to be responding to auditory hallucinations.  Nursing note and vitals reviewed.    ED Treatments / Results  Labs (all labs ordered are listed, but only abnormal results are displayed) Labs Reviewed  COMPREHENSIVE METABOLIC PANEL - Abnormal; Notable for the following:       Result Value   ALT 14 (*)    Total Bilirubin 1.3 (*)    All other components within  normal limits  ACETAMINOPHEN LEVEL - Abnormal; Notable for the following:    Acetaminophen (Tylenol), Serum <10 (*)    All other components within normal limits  CBC - Abnormal; Notable for the following:    Hemoglobin 12.8 (*)    HCT 38.9 (*)    All other components within normal limits  ETHANOL  SALICYLATE LEVEL  RAPID URINE DRUG SCREEN, HOSP PERFORMED    EKG  EKG Interpretation None       Radiology No results found.  Procedures Procedures  (including critical care time)  Medications Ordered in ED Medications  LORazepam (ATIVAN) tablet 1 mg (not administered)  acetaminophen (TYLENOL) tablet 650 mg (not administered)  ondansetron (ZOFRAN) tablet 4 mg (not administered)  ziprasidone (GEODON) injection 10 mg (10 mg Intramuscular Given 10/13/16 1818)  sterile water (preservative free) injection (  Given by Other 10/13/16 2009)     Initial Impression / Assessment and Plan / ED Course  I have reviewed the triage vital signs and the nursing notes.  Pertinent labs & imaging results that were available during my care of the patient were reviewed by me and considered in my medical decision making (see chart for details).    IVC forms completed. Will screen medically before psychiatric evaluation. Patient is cleared for psychiatric evaluation.  Final Clinical Impressions(s) / ED Diagnoses   Final diagnoses:  Acute psychosis    New Prescriptions New Prescriptions   No medications on file     Loren Racer, MD 10/13/16 2107

## 2016-10-13 NOTE — ED Notes (Signed)
Bed: [facility] Expected date:  Expected time:  Means of arrival:  Comments: 

## 2016-10-14 DIAGNOSIS — F1220 Cannabis dependence, uncomplicated: Secondary | ICD-10-CM | POA: Diagnosis not present

## 2016-10-14 DIAGNOSIS — F250 Schizoaffective disorder, bipolar type: Secondary | ICD-10-CM | POA: Diagnosis not present

## 2016-10-14 DIAGNOSIS — F1290 Cannabis use, unspecified, uncomplicated: Secondary | ICD-10-CM | POA: Diagnosis not present

## 2016-10-14 MED ORDER — LORAZEPAM 1 MG PO TABS: 2.0000 mg | ORAL_TABLET | Freq: Once | ORAL | Status: AC

## 2016-10-14 MED ORDER — ZIPRASIDONE MESYLATE 20 MG IM SOLR
20.0000 mg | Freq: Once | INTRAMUSCULAR | Status: AC
  Administered 2016-10-14: 20 mg via INTRAMUSCULAR
  Filled 2016-10-14: qty 20

## 2016-10-14 MED ORDER — DIVALPROEX SODIUM 500 MG PO DR TAB
500.0000 mg | DELAYED_RELEASE_TABLET | Freq: Two times a day (BID) | ORAL | Status: DC
  Filled 2016-10-14: qty 1

## 2016-10-14 MED ORDER — DIPHENHYDRAMINE HCL 50 MG/ML IJ SOLN
50.0000 mg | Freq: Once | INTRAMUSCULAR | Status: AC
  Administered 2016-10-14: 50 mg via INTRAMUSCULAR
  Filled 2016-10-14: qty 1

## 2016-10-14 MED ORDER — RISPERIDONE 0.5 MG PO TABS
0.5000 mg | ORAL_TABLET | Freq: Two times a day (BID) | ORAL | Status: DC
Start: 2016-10-14 — End: 2016-10-15
  Filled 2016-10-14: qty 1

## 2016-10-14 MED ORDER — STERILE WATER FOR INJECTION IJ SOLN
INTRAMUSCULAR | Status: AC
  Filled 2016-10-14: qty 10

## 2016-10-14 MED ORDER — LORAZEPAM 2 MG/ML IJ SOLN
2.0000 mg | Freq: Once | INTRAMUSCULAR | Status: AC
  Administered 2016-10-14: 2 mg via INTRAMUSCULAR
  Filled 2016-10-14: qty 1

## 2016-10-14 MED ORDER — TRAZODONE HCL 100 MG PO TABS: 100.0000 mg | ORAL_TABLET | Freq: Every evening | ORAL | Status: DC | PRN

## 2016-10-14 MED ORDER — ZIPRASIDONE MESYLATE 20 MG IM SOLR
10.0000 mg | Freq: Once | INTRAMUSCULAR | Status: AC
  Administered 2016-10-14: 10 mg via INTRAMUSCULAR
  Filled 2016-10-14: qty 20

## 2016-10-14 MED ORDER — DIPHENHYDRAMINE HCL 25 MG PO CAPS: 50.0000 mg | ORAL_CAPSULE | Freq: Once | ORAL | Status: AC

## 2016-10-14 NOTE — Progress Notes (Signed)
Referral submitted to: [place], [address], [place], [place], [facility], [place], [address], [address], [address], [place], [place], [place], [place], [place], [place], [employer], [place], [place], [place], [place], [place] K. Ziare Cryder, LCAS-A, LPC-A, NCC  Counselor 10/14/2016 1:21 AM

## 2016-10-14 NOTE — ED Notes (Signed)
Patient began pacing, pulling down posted signs and talking with unseen other.  Patient began punching in the direction of staff but not making contact.  Patient received IM medications due to agitation and increased aggression.

## 2016-10-14 NOTE — ED Notes (Signed)
Patient is up in the milieu entering other patient's rooms, going through the trash and appears to be responding to internal stimuli. Patient appears anxious and appears to have urinated in the shower. PA [facility] notified, new orders received.

## 2016-10-14 NOTE — Progress Notes (Signed)
10/14/16 1345: LRT was informed by staff to let pt stay sleep.  Caroll Rancher, LRT/CTRS

## 2016-10-14 NOTE — ED Notes (Signed)
Patient pacing the halls noted to be talking to self and increasing in aggressive gestures such as punching at the walls

## 2016-10-14 NOTE — Consult Note (Addendum)
[place] Psychiatry Consult   Reason for Consult:  Psychosis Referring Physician:  EDP Patient Identification: Cody [name] MRN:  [id number] Principal Diagnosis: Schizoaffective disorder, bipolar type [facility]) Diagnosis:   Patient Active Problem List   Diagnosis Date Noted  . Cannabis use disorder, severe, dependence ([place]) [F12.20] 12/11/2014    Priority: High  . Schizoaffective disorder, bipolar type ([place]) [F25.0] 11/05/2014    Priority: High    Total Time spent with patient: 30 minutes  Subjective:   Cody [name] is a 26 y.o. male patient admitted with reports of psychosis, urinating on himself and exposing himself in public. Pt seen and chart reviewed. Pt is alert/oriented to self, anxious, cooperative. Pt denies suicidal/homicidal ideation and psychosis and does not appear to be responding to internal stimuli. However, pt has impaired reality testing and his thought process was non-linear. He could not properly recall events of today or leading up to today and spoke in non-sensical statements. Nursing staff report that he continues to expose himself sexually.  HPI:  I have reviewed and concur with HPI elements below, modified as follows: "Cody [name] is an 26 y.o. male, Who reports to [facility] per ED report: history of schizoaffective disorder, bipolar type. Department by police after resisting arrest, exposing himself and then urinating into his mouth. This point he ran in to traffic. He was apprehended. He is unable to give account of events prior to coming to the emergency department. Patient was not alert or oriented, but per Rn report Denies SI/HI. Patient is currently responding to internal stimuli and mumbling incoherently with apparent AVH. Patient does have hx. Of S.A. With marijuana last use UTA. Patient has hx. Of inpatient psych care with last 04/2016 at [facility] for SI, schizophrenia and 10/2014 [facility] for schizophrenia/ SI. UTA current  outpatient psych care. Patient is staying under hospital blanket ,and is not alert or oriented. Patient speech was UTA mumbling/ incoherent at times and motor behavior appeared normal. Patient thought process is UTA. Patient does  appear to be responding to internal stimuli."  Interval History 10/14/16: Pt spent the night in the ED without incident and was evaluated as above.   Past Psychiatric History: psychosis  Risk to Self: Suicidal Ideation: No Suicidal Intent: No Is patient at risk for suicide?: No Suicidal Plan?: No Access to Means: No What has been your use of drugs/alcohol within the last 12 months?: marijuana How many times?: 0 Other Self Harm Risks: n/a Triggers for Past Attempts: Unknown Intentional Self Injurious Behavior: None Risk to Others: Homicidal Ideation: No Thoughts of Harm to Others: No Current Homicidal Intent: No Current Homicidal Plan: No Access to Homicidal Means: No Identified Victim: none reported History of harm to others?: No Assessment of Violence: None Noted Violent Behavior Description: none reported Does patient have access to weapons?: No Criminal Charges Pending?: No Does patient have a court date: No Prior Inpatient Therapy: Prior Inpatient Therapy: Yes Prior Therapy Dates: multiple last 2017,2016 Prior Therapy Facilty/Provider(s): [facility], [facility] Reason for Treatment: depression Prior Outpatient Therapy: Prior Outpatient Therapy: No Prior Therapy Dates: UTA Prior Therapy Facilty/Provider(s): UTA Reason for Treatment: UTA Does patient have an ACCT team?: Unknown Does patient have Intensive In-House Services?  : Unknown Does patient have [facility] services? : Unknown Does patient have P4CC services?: Unknown  Past Medical History:  Past Medical History:  Diagnosis Date  . Anxiety   . Mood disorder ([place])   . Schizophrenia ([place])  History reviewed. No pertinent surgical history. Family History: History reviewed. No  pertinent family history. Family Psychiatric  History: denies Social History:  History  Alcohol Use  . Yes     History  Drug Use  . Types: Marijuana    Social History   Social History  . Marital status: Single    Spouse name: N/A  . Number of children: N/A  . Years of education: N/A   Social History Main Topics  . Smoking status: Smoker, Current Status Unknown  . Smokeless tobacco: Never Used  . Alcohol use Yes  . Drug use: Yes    Types: Marijuana  . Sexual activity: Yes   Other Topics Concern  . None   Social History Narrative   ** Merged History Encounter **       Additional Social History:    Allergies:   Allergies  Allergen Reactions  . Penicillins Hives and Other (See Comments)    Has patient had a PCN reaction causing immediate rash, facial/tongue/throat swelling, SOB or lightheadedness with hypotension: No Has patient had a PCN reaction causing severe rash involving mucus membranes or skin necrosis: No Has patient had a PCN reaction that required hospitalization No Has patient had a PCN reaction occurring within the last 10 years: No If all of the above answers are "NO", then may proceed with Cephalosporin use.    Labs:  Results for orders placed or performed during the hospital encounter of 10/13/16 (from the past 48 hour(s))  Comprehensive metabolic panel     Status: Abnormal   Collection Time: 10/13/16  5:09 PM  Result Value Ref Range   Sodium 136 135 - 145 mmol/L   Potassium 4.1 3.5 - 5.1 mmol/L   Chloride 102 101 - 111 mmol/L   CO2 27 22 - 32 mmol/L   Glucose, Bld 90 65 - 99 mg/dL   BUN 11 6 - 20 mg/dL   Creatinine, Ser 0.87 0.61 - 1.24 mg/dL   Calcium 9.4 8.9 - 10.3 mg/dL   Total Protein 7.2 6.5 - 8.1 g/dL   Albumin 4.7 3.5 - 5.0 g/dL   AST 25 15 - 41 U/L   ALT 14 (L) 17 - 63 U/L   Alkaline Phosphatase 52 38 - 126 U/L   Total Bilirubin 1.3 (H) 0.3 - 1.2 mg/dL   GFR calc non Af Amer >60 >60 mL/min   GFR calc Af Amer >60 >60 mL/min     Comment: (NOTE) The eGFR has been calculated using the CKD EPI equation. This calculation has not been validated in all clinical situations. eGFR's persistently <60 mL/min signify possible Chronic Kidney Disease.    Anion gap 7 5 - 15  Ethanol     Status: None   Collection Time: 10/13/16  5:09 PM  Result Value Ref Range   Alcohol, Ethyl (B) <5 <5 mg/dL    Comment:        LOWEST DETECTABLE LIMIT FOR SERUM ALCOHOL IS 5 mg/dL FOR MEDICAL PURPOSES ONLY   Salicylate level     Status: None   Collection Time: 10/13/16  5:09 PM  Result Value Ref Range   Salicylate Lvl <0.9 2.8 - 30.0 mg/dL  Acetaminophen level     Status: Abnormal   Collection Time: 10/13/16  5:09 PM  Result Value Ref Range   Acetaminophen (Tylenol), Serum <10 (L) 10 - 30 ug/mL    Comment:        THERAPEUTIC CONCENTRATIONS VARY SIGNIFICANTLY. A RANGE OF 10-30 ug/mL MAY  BE AN EFFECTIVE CONCENTRATION FOR MANY PATIENTS. HOWEVER, SOME ARE BEST TREATED AT CONCENTRATIONS OUTSIDE THIS RANGE. ACETAMINOPHEN CONCENTRATIONS >150 ug/mL AT 4 HOURS AFTER INGESTION AND >50 ug/mL AT 12 HOURS AFTER INGESTION ARE OFTEN ASSOCIATED WITH TOXIC REACTIONS.   cbc     Status: Abnormal   Collection Time: 10/13/16  5:09 PM  Result Value Ref Range   WBC 6.5 4.0 - 10.5 K/uL   RBC 4.47 4.22 - 5.81 MIL/uL   Hemoglobin 12.8 (L) 13.0 - 17.0 g/dL   HCT 38.9 (L) 39.0 - 52.0 %   MCV 87.0 78.0 - 100.0 fL   MCH 28.6 26.0 - 34.0 pg   MCHC 32.9 30.0 - 36.0 g/dL   RDW 13.0 11.5 - 15.5 %   Platelets 222 150 - 400 K/uL  Rapid urine drug screen (hospital performed)     Status: Abnormal   Collection Time: 10/13/16  9:30 PM  Result Value Ref Range   Opiates NONE DETECTED NONE DETECTED   Cocaine NONE DETECTED NONE DETECTED   Benzodiazepines NONE DETECTED NONE DETECTED   Amphetamines NONE DETECTED NONE DETECTED   Tetrahydrocannabinol POSITIVE (A) NONE DETECTED   Barbiturates NONE DETECTED NONE DETECTED    Comment:        DRUG SCREEN FOR MEDICAL  PURPOSES ONLY.  IF CONFIRMATION IS NEEDED FOR ANY PURPOSE, NOTIFY LAB WITHIN 5 DAYS.        LOWEST DETECTABLE LIMITS FOR URINE DRUG SCREEN Drug Class       Cutoff (ng/mL) Amphetamine      1000 Barbiturate      200 Benzodiazepine   312 Tricyclics       811 Opiates          300 Cocaine          300 THC              50     Current Facility-Administered Medications  Medication Dose Route Frequency Provider Last Rate Last Dose  . sterile water (preservative free) injection           . acetaminophen (TYLENOL) tablet 650 mg  650 mg Oral Q4H PRN Julianne Rice, MD      . divalproex (DEPAKOTE) DR tablet 500 mg  500 mg Oral Q12H Airrion Otting, MD      . LORazepam (ATIVAN) tablet 1 mg  1 mg Oral Q8H PRN Julianne Rice, MD   1 mg at 10/14/16 0158  . ondansetron (ZOFRAN) tablet 4 mg  4 mg Oral Q8H PRN Julianne Rice, MD      . risperiDONE (RISPERDAL) tablet 0.5 mg  0.5 mg Oral BID Corena Pilgrim, MD      . traZODone (DESYREL) tablet 100 mg  100 mg Oral QHS PRN Corena Pilgrim, MD       No current outpatient prescriptions on file.    Musculoskeletal: Strength & Muscle Tone: within normal limits Gait & Station: normal Patient leans: N/A  Psychiatric Specialty Exam: Physical Exam  Review of Systems  Psychiatric/Behavioral: Positive for hallucinations (psychotic) and substance abuse. The patient is nervous/anxious and has insomnia.   All other systems reviewed and are negative.   Blood pressure 96/61, pulse 62, temperature 98.1 F (36.7 C), temperature source Oral, resp. rate 18, SpO2 97 %.There is no height or weight on file to calculate BMI.  General Appearance: Bizarre  Eye Contact:  Fair  Speech:  Clear and Coherent and Normal Rate  Volume:  Normal  Mood:  Anxious  Affect:  Non-Congruent  Thought Process:  Disorganized and Descriptions of Associations: Tangential  Orientation: Self   Thought Content:  Focused on random thoughts  Suicidal Thoughts:  No  Homicidal  Thoughts:  No  Memory:  Immediate;   Fair Recent;   Fair Remote;   Fair  Judgement:  Fair  Insight:  Fair  Psychomotor Activity:  Normal  Concentration:  Concentration: Fair and Attention Span: Fair  Recall:  [employer] of Knowledge:  Fair  Language:  Fair  Akathisia:  No  Handed:    AIMS (if indicated):     Assets:  Communication Skills Desire for Improvement Resilience Social Support  ADL's:  Intact  Cognition:  WNL  Sleep:      Treatment Plan Summary: Schizoaffective disorder, bipolar type (HCC) unstable, treated as below:  Medications: -x1 dose Geodon 51m, Benadryl 521m Ativan 35m39mM  -Trazodone 100m19m qhs prn insomnia -Risperidone 0.5mg 34mbid for psychosis -Depakote 500mg 36m12h for mood stabilization  Disposition: Recommend psychiatric Inpatient admission when medically cleared.  WithroBenjamine Mola5/[place]018 1:41 PM  Patient seen face-to-face for psychiatric evaluation, chart reviewed and case discussed with the physician extender and developed treatment plan. Reviewed the information documented and agree with the treatment plan. MojeedCorena Pilgrim

## 2016-10-15 DIAGNOSIS — F1290 Cannabis use, unspecified, uncomplicated: Secondary | ICD-10-CM

## 2016-10-15 DIAGNOSIS — F250 Schizoaffective disorder, bipolar type: Secondary | ICD-10-CM | POA: Diagnosis not present

## 2016-10-15 MED ORDER — RISPERIDONE 0.5 MG PO TABS: 0.5000 mg | ORAL_TABLET | Freq: Two times a day (BID) | ORAL | 0 refills | Status: DC

## 2016-10-15 MED ORDER — DIVALPROEX SODIUM 500 MG PO DR TAB: 500.0000 mg | DELAYED_RELEASE_TABLET | Freq: Two times a day (BID) | ORAL | 0 refills | Status: DC

## 2016-10-15 MED ORDER — TRAZODONE HCL 100 MG PO TABS: 100.0000 mg | ORAL_TABLET | Freq: Every evening | ORAL | 0 refills | Status: DC | PRN

## 2016-10-15 NOTE — [facility] Suicide Risk Assessment (Signed)
Suicide Risk Assessment  Discharge Assessment   [facility] Discharge Suicide Risk Assessment   [facility] Schizoaffect[facility] disorder, bipolar type [facility](HCC) Discharge Diagnoses:  Patient Active Problem List   Diagnosis Date Noted  . Schizoaffective disorder, bipolar type (HCC) [F25.0] 11/05/2014    Priority: High  . Cannabis use disorder, severe, dependence ([facility]CC) [F12.20] 12/11/2014    Total Time spent with patient: 30 minutes  Musculoskeletal: Strength & Muscle Tone: within normal limits Gait & Station: normal Patient leans: N/A  Psychiatric Specialty Exam: Physical Exam  Constitutional: He is oriented to person, place, and time. He appears well-developed and well-nourished.  HENT:  Head: Normocephalic.  Neck: Normal range of motion.  Respiratory: Effort normal.  Musculoskeletal: Normal range of motion.  Neurological: He is alert and oriented to person, place, and time.  Psychiatric: He has a normal mood and affect. His speech is normal and behavior is normal. Judgment and thought content normal. Cognition and memory are normal.    Review of Systems  Psychiatric/Behavioral: Positive for substance abuse.  All other systems reviewed and are negative.   Blood pressure 126/83, pulse 75, temperature 97.9 F (36.6 C), temperature source Oral, resp. rate 16, SpO2 100 %.There is no height or weight on file to calculate BMI.  General Appearance: Casual  Eye Contact:  Good  Speech:  Normal Rate  Volume:  Normal  Mood:  Euthymic  Affect:  Congruent  Thought Process:  Coherent and Descriptions of Associations: Intact  Orientation:  Full (Time, Place, and Person)  Thought Content:  WDL and Logical  Suicidal Thoughts:  No  Homicidal Thoughts:  No  Memory:  Immediate;   Good Recent;   Good Remote;   Good  Judgement:  Fair  Insight:  Fair  Psychomotor Activity:  Normal  Concentration:  Concentration: Good and Attention Span: Good  Recall:  Good  Fund of Knowledge:  Fair   Language:  Good  Akathisia:  No  Handed:  Right  AIMS (if indicated):     Assets:  Leisure Time Physical Health Resilience  ADL's:  Intact  Cognition:  WNL  Sleep:       Mental Status Per Nursing Assessment::   On Admission:   cannabis abuse with bizarre behaviors  Demographic Factors:  Male and Divorced or widowed  Loss Factors: Legal issues  Historical Factors: NA  Risk Reduction Factors:   Sense of responsibility to family and Positive therapeutic relationship  Continued Clinical Symptoms:  None  Cognitive Features That Contribute To Risk:  None    Suicide Risk:  Minimal: No identifiable suicidal ideation.  Patients presenting with no risk factors but with morbid ruminations; may be classified as minimal risk based on the severity of the depressive symptoms    Plan Of Care/Follow-up recommendations:  Activity:  as tolerated Diet:  heart healhty diet  LORD, JAMISON, NP 10/15/2016, 11:26 AM

## 2016-10-15 NOTE — ED Notes (Signed)
During the shift patient constantly asked for sandwich's and snacks. When informed of unit rules patient started tearing down signs off bathroom door and throwing his trash in floor. Every time patient was denied a snack he acted out. Patient would also pull code blue light and ask for a sandwich. Patient was remined what time breakfast came and that he could have waster until then. Patient decline water every time. Encouragement and support provided and safety maintain. Q 15 min safety checks remain in place.

## 2016-10-15 NOTE — ED Notes (Signed)
Pt d/c home per MD order. Discharge summary reviewed with pt. Pt verbalizes understanding of discharge summary. RX provided. Pt denies SI/HI/AVH. Personal belongings returned to pt. Pt signed e-signature. Ambulatory off unit with MHT.

## 2016-10-15 NOTE — Consult Note (Signed)
[place] Psychiatry Consult   Reason for Consult:  Bizarre behaviors, cannabis abuse Referring Physician:  EDP Patient Identification: Cody [name] MRN:  [id number] Principal Diagnosis: Schizoaffective disorder, bipolar type [facility]) Diagnosis:   Patient Active Problem List   Diagnosis Date Noted  . Schizoaffective disorder, bipolar type ([place]) [F25.0] 11/05/2014    Priority: High  . Cannabis use disorder, severe, dependence ([place]) [F12.20] 12/11/2014    Total Time spent with patient: 45 minutes  Subjective:   Cody [name] is a 26 y.o. male patient has stabilized.  HPI:  26 yo male who presented to the ED after using cannabis with bizarre behaviors.  Medications were started and he stabilized.  No suicidal/homicidal ideations, hallucinations, and withdrawal symptoms.  Demanding snacks and food while act out by breaking things when he does not get them.  Stable at this time, discharge. Caveat:  Court date today for attempting to break in and other charges.  Past Psychiatric History: schizoaffective disorder  Risk to Self: Suicidal Ideation: No Suicidal Intent: No Is patient at risk for suicide?: No Suicidal Plan?: No Access to Means: No What has been your use of drugs/alcohol within the last 12 months?: marijuana How many times?: 0 Other Self Harm Risks: n/a Triggers for Past Attempts: Unknown Intentional Self Injurious Behavior: None Risk to Others: Homicidal Ideation: No Thoughts of Harm to Others: No Current Homicidal Intent: No Current Homicidal Plan: No Access to Homicidal Means: No Identified Victim: none reported History of harm to others?: No Assessment of Violence: None Noted Violent Behavior Description: none reported Does patient have access to weapons?: No Criminal Charges Pending?: No Does patient have a court date: No Prior Inpatient Therapy: Prior Inpatient Therapy: Yes Prior Therapy Dates: multiple last 2017,2016 Prior Therapy  Facilty/Provider(s): [facility], [facility] Reason for Treatment: depression Prior Outpatient Therapy: Prior Outpatient Therapy: No Prior Therapy Dates: UTA Prior Therapy Facilty/Provider(s): UTA Reason for Treatment: UTA Does patient have an ACCT team?: Unknown Does patient have Intensive In-House Services?  : Unknown Does patient have [facility] services? : Unknown Does patient have P4CC services?: Unknown  Past Medical History:  Past Medical History:  Diagnosis Date  . Anxiety   . Mood disorder ([place])   . Schizophrenia ([place])    History reviewed. No pertinent surgical history. Family History: History reviewed. No pertinent family history. Family Psychiatric  History: unknown Social History:  History  Alcohol Use  . Yes     History  Drug Use  . Types: Marijuana    Social History   Social History  . Marital status: Single    Spouse name: N/A  . Number of children: N/A  . Years of education: N/A   Social History Main Topics  . Smoking status: Smoker, Current Status Unknown  . Smokeless tobacco: Never Used  . Alcohol use Yes  . Drug use: Yes    Types: Marijuana  . Sexual activity: Yes   Other Topics Concern  . None   Social History Narrative   ** Merged History Encounter **       Additional Social History:    Allergies:   Allergies  Allergen Reactions  . Penicillins Hives and Other (See Comments)    Has patient had a PCN reaction causing immediate rash, facial/tongue/throat swelling, SOB or lightheadedness with hypotension: No Has patient had a PCN reaction causing severe rash involving mucus membranes or skin necrosis: No Has patient had a PCN reaction that required hospitalization No Has patient had a PCN  reaction occurring within the last 10 years: No If all of the above answers are "NO", then may proceed with Cephalosporin use.    Labs:  Results for orders placed or performed during the hospital encounter of 10/13/16 (from the past 48 hour(s))   Comprehensive metabolic panel     Status: Abnormal   Collection Time: 10/13/16  5:09 PM  Result Value Ref Range   Sodium 136 135 - 145 mmol/L   Potassium 4.1 3.5 - 5.1 mmol/L   Chloride 102 101 - 111 mmol/L   CO2 27 22 - 32 mmol/L   Glucose, Bld 90 65 - 99 mg/dL   BUN 11 6 - 20 mg/dL   Creatinine, Ser 0.87 0.61 - 1.24 mg/dL   Calcium 9.4 8.9 - 10.3 mg/dL   Total Protein 7.2 6.5 - 8.1 g/dL   Albumin 4.7 3.5 - 5.0 g/dL   AST 25 15 - 41 U/L   ALT 14 (L) 17 - 63 U/L   Alkaline Phosphatase 52 38 - 126 U/L   Total Bilirubin 1.3 (H) 0.3 - 1.2 mg/dL   GFR calc non Af Amer >60 >60 mL/min   GFR calc Af Amer >60 >60 mL/min    Comment: (NOTE) The eGFR has been calculated using the CKD EPI equation. This calculation has not been validated in all clinical situations. eGFR's persistently <60 mL/min signify possible Chronic Kidney Disease.    Anion gap 7 5 - 15  Ethanol     Status: None   Collection Time: 10/13/16  5:09 PM  Result Value Ref Range   Alcohol, Ethyl (B) <5 <5 mg/dL    Comment:        LOWEST DETECTABLE LIMIT FOR SERUM ALCOHOL IS 5 mg/dL FOR MEDICAL PURPOSES ONLY   Salicylate level     Status: None   Collection Time: 10/13/16  5:09 PM  Result Value Ref Range   Salicylate Lvl <1.4 2.8 - 30.0 mg/dL  Acetaminophen level     Status: Abnormal   Collection Time: 10/13/16  5:09 PM  Result Value Ref Range   Acetaminophen (Tylenol), Serum <10 (L) 10 - 30 ug/mL    Comment:        THERAPEUTIC CONCENTRATIONS VARY SIGNIFICANTLY. A RANGE OF 10-30 ug/mL MAY BE AN EFFECTIVE CONCENTRATION FOR MANY PATIENTS. HOWEVER, SOME ARE BEST TREATED AT CONCENTRATIONS OUTSIDE THIS RANGE. ACETAMINOPHEN CONCENTRATIONS >150 ug/mL AT 4 HOURS AFTER INGESTION AND >50 ug/mL AT 12 HOURS AFTER INGESTION ARE OFTEN ASSOCIATED WITH TOXIC REACTIONS.   cbc     Status: Abnormal   Collection Time: 10/13/16  5:09 PM  Result Value Ref Range   WBC 6.5 4.0 - 10.5 K/uL   RBC 4.47 4.22 - 5.81 MIL/uL    Hemoglobin 12.8 (L) 13.0 - 17.0 g/dL   HCT 38.9 (L) 39.0 - 52.0 %   MCV 87.0 78.0 - 100.0 fL   MCH 28.6 26.0 - 34.0 pg   MCHC 32.9 30.0 - 36.0 g/dL   RDW 13.0 11.5 - 15.5 %   Platelets 222 150 - 400 K/uL  Rapid urine drug screen (hospital performed)     Status: Abnormal   Collection Time: 10/13/16  9:30 PM  Result Value Ref Range   Opiates NONE DETECTED NONE DETECTED   Cocaine NONE DETECTED NONE DETECTED   Benzodiazepines NONE DETECTED NONE DETECTED   Amphetamines NONE DETECTED NONE DETECTED   Tetrahydrocannabinol POSITIVE (A) NONE DETECTED   Barbiturates NONE DETECTED NONE DETECTED    Comment:  DRUG SCREEN FOR MEDICAL PURPOSES ONLY.  IF CONFIRMATION IS NEEDED FOR ANY PURPOSE, NOTIFY LAB WITHIN 5 DAYS.        LOWEST DETECTABLE LIMITS FOR URINE DRUG SCREEN Drug Class       Cutoff (ng/mL) Amphetamine      1000 Barbiturate      200 Benzodiazepine   920 Tricyclics       100 Opiates          300 Cocaine          300 THC              50     Current Facility-Administered Medications  Medication Dose Route Frequency Provider Last Rate Last Dose  . acetaminophen (TYLENOL) tablet 650 mg  650 mg Oral Q4H PRN Julianne Rice, MD      . divalproex (DEPAKOTE) DR tablet 500 mg  500 mg Oral Q12H Jermey Closs, MD      . LORazepam (ATIVAN) tablet 1 mg  1 mg Oral Q8H PRN Julianne Rice, MD   1 mg at 10/14/16 0158  . ondansetron (ZOFRAN) tablet 4 mg  4 mg Oral Q8H PRN Julianne Rice, MD      . risperiDONE (RISPERDAL) tablet 0.5 mg  0.5 mg Oral BID Corena Pilgrim, MD      . traZODone (DESYREL) tablet 100 mg  100 mg Oral QHS PRN Corena Pilgrim, MD       Current Outpatient Prescriptions  Medication Sig Dispense Refill  . divalproex (DEPAKOTE) 500 MG DR tablet Take 1 tablet (500 mg total) by mouth every 12 (twelve) hours. 60 tablet 0  . risperiDONE (RISPERDAL) 0.5 MG tablet Take 1 tablet (0.5 mg total) by mouth 2 (two) times daily. 60 tablet 0  . traZODone (DESYREL) 100 MG tablet  Take 1 tablet (100 mg total) by mouth at bedtime as needed for sleep. 30 tablet 0    Musculoskeletal: Strength & Muscle Tone: within normal limits Gait & Station: normal Patient leans: N/A  Psychiatric Specialty Exam: Physical Exam  Constitutional: He is oriented to person, place, and time. He appears well-developed and well-nourished.  HENT:  Head: Normocephalic.  Neck: Normal range of motion.  Respiratory: Effort normal.  Musculoskeletal: Normal range of motion.  Neurological: He is alert and oriented to person, place, and time.  Psychiatric: He has a normal mood and affect. His speech is normal and behavior is normal. Judgment and thought content normal. Cognition and memory are normal.    Review of Systems  Psychiatric/Behavioral: Positive for substance abuse.  All other systems reviewed and are negative.   Blood pressure 126/83, pulse 75, temperature 97.9 F (36.6 C), temperature source Oral, resp. rate 16, SpO2 100 %.There is no height or weight on file to calculate BMI.  General Appearance: Casual  Eye Contact:  Good  Speech:  Normal Rate  Volume:  Normal  Mood:  Euthymic  Affect:  Congruent  Thought Process:  Coherent and Descriptions of Associations: Intact  Orientation:  Full (Time, Place, and Person)  Thought Content:  WDL and Logical  Suicidal Thoughts:  No  Homicidal Thoughts:  No  Memory:  Immediate;   Good Recent;   Good Remote;   Good  Judgement:  Fair  Insight:  Fair  Psychomotor Activity:  Normal  Concentration:  Concentration: Good and Attention Span: Good  Recall:  Good  Fund of Knowledge:  Fair  Language:  Good  Akathisia:  No  Handed:  Right  AIMS (if indicated):  Assets:  Leisure Time Physical Health Resilience  ADL's:  Intact  Cognition:  WNL  Sleep:        Treatment Plan Summary: Daily contact with patient to assess and evaluate symptoms and progress in treatment, Medication management and Plan schizoaffective disorder, bipolar  type:  -Crisis stabilization -Medication management:  Restarted Risperdal 0.5 mg BID for psychosis, Trazodone 100 mg at bedtime PRN sleep, and Depakote 500 mg BID for mood stabilization -Individual and substance abuse counseling -Rx provided  Disposition: No evidence of imminent risk to self or others at present.    Waylan Boga, NP 10/15/2016 10:47 AM  Patient seen face-to-face for psychiatric evaluation, chart reviewed and case discussed with the physician extender and developed treatment plan. Reviewed the information documented and agree with the treatment plan. Corena Pilgrim, MD

## 2016-10-15 NOTE — ED Notes (Signed)
Attempted to reach pt on [phone], no answer/no recording.  1 rolling bag found beneath desk in TCU that did not move with pt to [facility].  Bag placed in closet in [facility].

## 2017-04-30 ENCOUNTER — Encounter (HOSPITAL_COMMUNITY): Payer: Self-pay | Admitting: *Deleted

## 2017-04-30 ENCOUNTER — Emergency Department (HOSPITAL_COMMUNITY)
Admission: EM | Admit: 2017-04-30 | Discharge: 2017-05-02 | Disposition: A | Discharge status: Home / Self Care | Payer: Self-pay | Attending: Emergency Medicine | Admitting: Emergency Medicine

## 2017-04-30 ENCOUNTER — Other Ambulatory Visit: Payer: Self-pay

## 2017-04-30 DIAGNOSIS — F1220 Cannabis dependence, uncomplicated: Secondary | ICD-10-CM | POA: Insufficient documentation

## 2017-04-30 DIAGNOSIS — F1290 Cannabis use, unspecified, uncomplicated: Secondary | ICD-10-CM | POA: Diagnosis present

## 2017-04-30 DIAGNOSIS — F250 Schizoaffective disorder, bipolar type: Secondary | ICD-10-CM | POA: Insufficient documentation

## 2017-04-30 DIAGNOSIS — F419 Anxiety disorder, unspecified: Secondary | ICD-10-CM | POA: Insufficient documentation

## 2017-04-30 DIAGNOSIS — F259 Schizoaffective disorder, unspecified: Secondary | ICD-10-CM | POA: Diagnosis present

## 2017-04-30 DIAGNOSIS — F1221 Cannabis dependence, in remission: Secondary | ICD-10-CM | POA: Diagnosis present

## 2017-04-30 DIAGNOSIS — Z046 Encounter for general psychiatric examination, requested by authority: Secondary | ICD-10-CM

## 2017-04-30 LAB — COMPREHENSIVE METABOLIC PANEL
ALT: 14 U/L — AB (ref 17–63)
ANION GAP: 9 (ref 5–15)
AST: 20 U/L (ref 15–41)
Albumin: 4.4 g/dL (ref 3.5–5.0)
Alkaline Phosphatase: 53 U/L (ref 38–126)
BILIRUBIN TOTAL: 1.3 mg/dL — AB (ref 0.3–1.2)
BUN: 7 mg/dL (ref 6–20)
CALCIUM: 9.6 mg/dL (ref 8.9–10.3)
CO2: 25 mmol/L (ref 22–32)
CREATININE: 0.94 mg/dL (ref 0.61–1.24)
Chloride: 104 mmol/L (ref 101–111)
GFR calc non Af Amer: >60 mL/min (ref >60)
GLUCOSE: 83 mg/dL (ref 65–99)
Potassium: 3.9 mmol/L (ref 3.5–5.1)
SODIUM: 138 mmol/L (ref 135–145)
Total Protein: 7.1 g/dL (ref 6.5–8.1)

## 2017-04-30 LAB — CBC WITH DIFFERENTIAL/PLATELET
BASOS ABS: 0 10*3/uL (ref 0.0–0.1)
Basophils Relative: 0 %
EOS ABS: 0.1 10*3/uL (ref 0.0–0.7)
EOS PCT: 1 %
HCT: 39.1 % (ref 39.0–52.0)
Hemoglobin: 13 g/dL (ref 13.0–17.0)
LYMPHS ABS: 2 10*3/uL (ref 0.7–4.0)
LYMPHS PCT: 21 %
MCH: 29 pg (ref 26.0–34.0)
MCHC: 33.2 g/dL (ref 30.0–36.0)
MCV: 87.3 fL (ref 78.0–100.0)
MONO ABS: 0.6 10*3/uL (ref 0.1–1.0)
Monocytes Relative: 7 %
Neutro Abs: 7.1 10*3/uL (ref 1.7–7.7)
Neutrophils Relative %: 71 %
PLATELETS: 196 10*3/uL (ref 150–400)
RBC: 4.48 MIL/uL (ref 4.22–5.81)
RDW: 13.1 % (ref 11.5–15.5)
WBC: 9.8 10*3/uL (ref 4.0–10.5)

## 2017-04-30 LAB — RAPID URINE DRUG SCREEN, HOSP PERFORMED
AMPHETAMINES: NOT DETECTED
BENZODIAZEPINES: NOT DETECTED
Barbiturates: NOT DETECTED
COCAINE: NOT DETECTED
Opiates: NOT DETECTED
Tetrahydrocannabinol: POSITIVE — AB

## 2017-04-30 LAB — SALICYLATE LEVEL

## 2017-04-30 LAB — ETHANOL

## 2017-04-30 LAB — ACETAMINOPHEN LEVEL: Acetaminophen (Tylenol), Serum: <10 ug/mL — ABNORMAL LOW (ref 10–30)

## 2017-04-30 MED ORDER — RISPERIDONE 0.5 MG PO TABS
0.5000 mg | ORAL_TABLET | Freq: Two times a day (BID) | ORAL | Status: DC
Start: 2017-04-30 — End: 2017-05-02
  Administered 2017-05-01 – 2017-05-02 (×4): 0.5 mg via ORAL
  Filled 2017-04-30 (×4): qty 1

## 2017-04-30 MED ORDER — LORAZEPAM 1 MG PO TABS
0.0000 mg | ORAL_TABLET | Freq: Four times a day (QID) | ORAL | Status: DC
  Administered 2017-05-01: 2 mg via ORAL
  Filled 2017-04-30: qty 2

## 2017-04-30 MED ORDER — LORAZEPAM 1 MG PO TABS: 0.0000 mg | ORAL_TABLET | Freq: Two times a day (BID) | ORAL | Status: DC

## 2017-04-30 MED ORDER — LORAZEPAM 2 MG/ML IJ SOLN: 0.0000 mg | Freq: Two times a day (BID) | INTRAMUSCULAR | Status: DC

## 2017-04-30 MED ORDER — TRAZODONE HCL 100 MG PO TABS
100.0000 mg | ORAL_TABLET | Freq: Every evening | ORAL | Status: DC | PRN
  Administered 2017-05-01: 100 mg via ORAL
  Filled 2017-04-30: qty 1

## 2017-04-30 MED ORDER — VITAMIN B-1 100 MG PO TABS
100.0000 mg | ORAL_TABLET | Freq: Every day | ORAL | Status: DC
  Administered 2017-05-01 – 2017-05-02 (×2): 100 mg via ORAL
  Filled 2017-04-30 (×2): qty 1

## 2017-04-30 MED ORDER — LORAZEPAM 2 MG/ML IJ SOLN: 0.0000 mg | Freq: Four times a day (QID) | INTRAMUSCULAR | Status: DC

## 2017-04-30 MED ORDER — DIVALPROEX SODIUM 500 MG PO DR TAB
500.0000 mg | DELAYED_RELEASE_TABLET | Freq: Two times a day (BID) | ORAL | Status: DC
  Administered 2017-05-01 – 2017-05-02 (×4): 500 mg via ORAL
  Filled 2017-04-30 (×4): qty 1

## 2017-04-30 MED ORDER — THIAMINE HCL 100 MG/ML IJ SOLN: 100.0000 mg | Freq: Every day | INTRAMUSCULAR | Status: DC

## 2017-04-30 NOTE — ED Notes (Signed)
Bed: WA27 Expected date:  Expected time:  Means of arrival:  Comments: [employer] IVC 

## 2017-04-30 NOTE — ED Provider Notes (Signed)
[facility]-EMERGENCY DEPT Provider Note   CSN: [id number] Arrival date & time: 04/30/17  1713     History   Chief Complaint Chief Complaint  Patient presents with  . Medical Clearance    HPI Cody [name] is a 26 y.o. male with history of bipolar and schizophrenia presents to ED with IVC obtained by psych counselor. Per [employer] patient was hostile and aggressive towards family members and counselor today, counselor filled out IVC paper work. Per IVC paperwork patient has been reportedly hostile and aggressive towards family members, destroyed property inside his home and has been noncompliant with medications Depakote, Artane and Haldol.   Denies suicidal ideation, homicidal ideation, aggression, depressive mood, anxiety, visual/auditory hallucinations. States he is in the emergency department for "just a checkup". He does not know why he is here or who called EMS. States he does not have any mental health conditions and states he "just trying to get a paycheck". He denies fevers, cough, chest pain, abdominal pain, vomiting, diarrhea or headache. Denies head/neck trauma. States he occasionally smokes marijuana and alcohol.  HPI  Past Medical History:  Diagnosis Date  . Anxiety   . Mood disorder ([facility])   . Schizophrenia [facility])     Patient Active Problem List   Diagnosis Date Noted  . Cannabis use disorder, severe, dependence ([facility]) 12/11/2014  . Schizoaffective disorder, bipolar type ([facility]) 11/05/2014    History reviewed. No pertinent surgical history.     Home Medications    Prior to Admission medications   Medication Sig Start Date End Date Taking? Authorizing Provider  divalproex (DEPAKOTE) 500 MG DR tablet Take 1 tablet (500 mg total) by mouth every 12 (twelve) hours. Patient not taking: Reported on 04/30/2017 10/15/16   Charm Rings, NP  risperiDONE (RISPERDAL) 0.5 MG tablet Take 1 tablet (0.5 mg total) by mouth 2 (two) times daily. Patient not  taking: Reported on 04/30/2017 10/15/16   Charm Rings, NP  traZODone (DESYREL) 100 MG tablet Take 1 tablet (100 mg total) by mouth at bedtime as needed for sleep. Patient not taking: Reported on 04/30/2017 10/15/16   Charm Rings, NP    Family History History reviewed. No pertinent family history.  Social History Social History   Tobacco Use  . Smoking status: Smoker, Current Status Unknown  . Smokeless tobacco: Never Used  Substance Use Topics  . Alcohol use: Yes  . Drug use: Yes    Types: Marijuana     Allergies   Penicillins   Review of Systems Review of Systems  Psychiatric/Behavioral: Positive for agitation and behavioral problems.  All other systems reviewed and are negative.    Physical Exam Updated Vital Signs BP 135/75 (BP Location: Left Arm)   Pulse 86   Temp 98.2 F (36.8 C) (Oral)   Resp 18   SpO2 95%   Physical Exam  Constitutional: He is oriented to person, place, and time. He appears well-developed and well-nourished. No distress.  Well-appearing and in no acute distress  HENT:  Head: Normocephalic and atraumatic.  Right Ear: External ear normal.  Left Ear: External ear normal.  Nose: Nose normal.  Moist mucous membranes. Oropharynx and tonsils are normal.  Eyes: Conjunctivae are normal. No scleral icterus.  PERRL and EOM intact bilaterally  Neck: Normal range of motion. Neck supple.  No midline C-spine tenderness. Painless passive range of motion of the neck.  Cardiovascular: Normal rate, regular rhythm, normal heart sounds and intact distal pulses.  No murmur  heard. 2+ DP and radial pulses bilaterally No lower extremity edema  Pulmonary/Chest: Effort normal and breath sounds normal. He has no wheezes.  Abdominal: Soft. Bowel sounds are normal. There is no tenderness.  Musculoskeletal: Normal range of motion. He exhibits no deformity.  Neurological: He is alert and oriented to person, place, and time.  Skin: Skin is warm and dry.  Capillary refill takes less than 2 seconds.  Psychiatric: His speech is normal and behavior is normal. His affect is blunt and inappropriate. He expresses no homicidal and no suicidal ideation. He expresses no suicidal plans and no homicidal plans.  Nursing note and vitals reviewed.    ED Treatments / Results  Labs (all labs ordered are listed, but only abnormal results are displayed) Labs Reviewed  COMPREHENSIVE METABOLIC PANEL - Abnormal; Notable for the following components:      Result Value   ALT 14 (*)    Total Bilirubin 1.3 (*)    All other components within normal limits  RAPID URINE DRUG SCREEN, HOSP PERFORMED - Abnormal; Notable for the following components:   Tetrahydrocannabinol POSITIVE (*)    All other components within normal limits  ACETAMINOPHEN LEVEL - Abnormal; Notable for the following components:   Acetaminophen (Tylenol), Serum <10 (*)    All other components within normal limits  ETHANOL  CBC WITH DIFFERENTIAL/PLATELET  SALICYLATE LEVEL  CBC    EKG  EKG Interpretation None       Radiology No results found.  Procedures Procedures (including critical care time)  Medications Ordered in ED Medications  LORazepam (ATIVAN) injection 0-4 mg (0 mg Intravenous Not Given 04/30/17 1824)    Or  LORazepam (ATIVAN) tablet 0-4 mg ( Oral See Alternative 04/30/17 1824)  LORazepam (ATIVAN) injection 0-4 mg (not administered)    Or  LORazepam (ATIVAN) tablet 0-4 mg (not administered)  thiamine (VITAMIN B-1) tablet 100 mg (100 mg Oral Not Given 04/30/17 1824)    Or  thiamine (B-1) injection 100 mg ( Intravenous See Alternative 04/30/17 1824)  risperiDONE (RISPERDAL) tablet 0.5 mg (not administered)  traZODone (DESYREL) tablet 100 mg (not administered)  divalproex (DEPAKOTE) DR tablet 500 mg (not administered)     Initial Impression / Assessment and Plan / ED Course  I have reviewed the triage vital signs and the nursing notes.  Pertinent labs & imaging  results that were available during my care of the patient were reviewed by me and considered in my medical decision making (see chart for details).  Clinical Course as of May 01 2151  Caleen EssexFri Apr 30, 2017  2004 Delay in labs, talked to RN who will f/u with labs  [CG]  2055 Spoke to TTS counselor she did not have IVC paperwork available to her during evaluation and ran patient by provider who recommend hold over night until able to get collateral. I informed her I accidentally had and kep the IVC paperwork but it is now available in TCU. Counselor will attempt to get collateral tonight and will notify me if disposition able to be changed tonight.   [CG]  2149 Spoke to TTS counselor again who re-evaluated pt and spoke to psych Np, recommending inpatient psych tx. No beds available, will attempt placement in the AM.   [CG]    Clinical Course User Index [CG] Liberty HandyGibbons, Claudia J, PA-C    26 year old with extensive psych history presents with IVC paperwork filled out by counselor for increased aggression towards family members, destroying/punching holes in the walls at home and medical  noncompliance. My exam patient denies everything and states that he is here for "just a checkup". He denies any psychiatric illnesses and states he is just trying to get a paycheck. Physical exam is unremarkable.   Labwork reviewed. He is medically cleared for TTS consult.  Spoke to TTS counselor who reviewed IVC paperwork. Disposition for inpatient psychiatric treatment. Awaiting bed placement. Home meds reordered.  Final Clinical Impressions(s) / ED Diagnoses   Final diagnoses:  Involuntary commitment    ED Discharge Orders    None       Jerrell MylarGibbons, Claudia J, PA-C 04/30/17 2152    Arby BarrettePfeiffer, Marcy, MD 05/02/17 1530

## 2017-04-30 NOTE — ED Triage Notes (Signed)
Patients therapist took out IVC due to patient being aggressive, violent and combative

## 2017-04-30 NOTE — ED Notes (Signed)
Patient has a care coordinator:  Victorino DikeJennifer gates [phone]

## 2017-04-30 NOTE — ED Notes (Signed)
ED Provider at bedside.( PA) 

## 2017-04-30 NOTE — BH Assessment (Addendum)
Assessment Note  Cody [name] is an 26 y.o. male, who [facility]. Pt was a poor historian during the assessment. Clinician asked the pt, "what brought you to the hospital?" Pt reported, his 25 year-old brother called the police, had him sent to the hospital for a routine check-up. Clinician observed the pt go back and forth, "if he had access to weapons." Pt confirmed then denied having access to knives. Towards the end of the assessment, pt asked clinician "how do you get a needle out your head and butt?" Pt denies, SI, HI, AVH, self-injurious and access to weapons.   Clinician attempted to contact pt's case manager who initialed the pt's IVC paperwork to gather additional collateral information, to no avail. Per pt's IVC paperwork: "The respondent is hostile and aggressive towards family members. The respondent has destroyed property inside of the home and knocked holes in several walls. The respondent has been diagnosed as Bipolar and Schizophrenia and was prescribed Depakote, Artane, and Haldol which he refuses to take. 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 The respondent is using marijuana and alcohol daily. The respondent has a [facility]. The respondent is a danger to himself and others."   Pt denies abuse and substance use. Pt's USD is positive for marijuana. Pt denies, being linked to OPT resources (medication management and/or counseling.) Pt is linked to [facility] for medication management and counseling. Pt denies, being prescribed medications.   Pt presents quiet/awake in scrubs with logical/coherent speech. Pt's eye contact was poor. Pt's mood was labile. Pt's affect was congruent with mood. Pt's thought process was circumstantial. Pt's judgement was impaired. Pt's concentration was normal. Pt's insight and impulse control are poor. Pt was oriented x4 (day,  year, city and state.) Pt reported, [facility] he could contract for safety.   Diagnosis: F25.0 Schizoaffective disorder, Bipolar type.                      F12.20 Cannabis use disorder, Moderate.  Past Medical History:  Past Medical History:  Diagnosis Date  . Anxiety   . Mood disorder (HCC)   . Schizophrenia (HCC)     History reviewed. No pertinent surgical history.  Family History: History reviewed. No pertinent family history.  Social History:  reports that he has been smoking.  he has never used smokeless tobacco. He reports that he drinks alcohol. He reports that he uses drugs. Drug: Marijuana.  Additional Social History:  Alcohol / Drug Use Pain Medications: See MAR Prescriptions:  See MAR Over the Counter: See MAR History of alcohol / drug use?: Yes(Pt denies. ) Substance #1 Name of Substance 1: Marijuana. 1 - Age of First Use: UTA 1 - Amount (size/oz): Pt denies substanse use. Pt's UDS is positive for marijuana.  1 - Frequency: UTA 1 - Duration: UTA 1 - Last Use / Amount: UTA  CIWA: CIWA-Ar BP: 135/75 Pulse Rate: 86 Nausea and Vomiting: no nausea and no vomiting Tactile Disturbances: none Tremor: no tremor Auditory Disturbances: not present Paroxysmal Sweats: no sweat visible Visual Disturbances: not present Anxiety: no anxiety, at ease Headache, Fullness in Head: none present Agitation: normal activity Orientation and Clouding of Sensorium: oriented and can do serial additions CIWA-Ar Total: 0 COWS:    Allergies:  Allergies  Allergen Reactions  . Penicillins Hives and Other (See Comments)  Has patient had a PCN reaction causing immediate rash, facial/tongue/throat swelling, SOB or lightheadedness with hypotension: No Has patient had a PCN reaction causing severe rash involving mucus membranes or skin necrosis: No Has patient had a PCN reaction that required hospitalization No Has patient had a PCN reaction occurring within the last 10  years: No If all of the above answers are "NO", then may proceed with Cephalosporin use.    Home Medications:  (Not in a hospital admission)  OB/GYN Status:  No LMP for male patient.  General Assessment Data Location of Assessment: [facility] TTS Assessment: In system Is this a Tele or Face-to-Face Assessment?: Face-to-Face Is this an Initial Assessment or a Re-assessment for this encounter?: Initial Assessment Marital status: Single Pregnancy Status: No Living Arrangements: Alone(Pt reported, rooming house. ) Can pt return to current living arrangement?: Yes Admission Status: Involuntary Referral Source: Other([employer]) Insurance type: Self-pay     Crisis Care Plan Living Arrangements: Alone(Pt reported, rooming house. ) Legal Guardian: Other:(Self.) Name of Psychiatrist: [facility] Name of Therapist: [facility].   Education Status Is patient currently in school?: No Current Grade: NA Highest grade of school patient has completed: 12th grade. Name of school: NA Contact person: NA  Risk to self with the past 6 months Suicidal Ideation: No(Pt denies. ) Has patient been a risk to self within the past 6 months prior to admission? : No Suicidal Intent: No Has patient had any suicidal intent within the past 6 months prior to admission? : No Is patient at risk for suicide?: No Suicidal Plan?: No Has patient had any suicidal plan within the past 6 months prior to admission? : No Access to Means: No What has been your use of drugs/alcohol within the last 12 months?: Marijuana. Previous Attempts/Gestures: No How many times?: 0 Other Self Harm Risks: Pt denies.  Triggers for Past Attempts: None known Intentional Self Injurious Behavior: None(Pt denies. ) Family Suicide History: No Recent stressful life event(s): (Pt denies. ) Persecutory voices/beliefs?: No Depression: No(Pt denies. ) Depression Symptoms: (Pt denies.) Substance abuse history and/or treatment for  substance abuse?: Yes Suicide prevention information given to non-admitted patients: Not applicable  Risk to Others within the past 6 months Homicidal Ideation: No(Pt denies.) Does patient have any lifetime risk of violence toward others beyond the six months prior to admission? : No Thoughts of Harm to Others: No Current Homicidal Intent: No Current Homicidal Plan: No Access to Homicidal Means: No Identified Victim: NA History of harm to others?: (Per IVC.) Assessment of Violence: On admission Violent Behavior Description: Per IVC pt is hostile and aggressive towards family members. Does patient have access to weapons?: No(Pt denies. ) Criminal Charges Pending?: No Does patient have a court date: No Is patient on probation?: No  Psychosis Hallucinations: Auditory, Visual Delusions: Unspecified  Mental Status Report Appearance/Hygiene: In scrubs Eye Contact: Poor Motor Activity: Unremarkable Speech: Logical/coherent Level of Consciousness: Quiet/awake Mood: Labile Affect: Other (Comment)(congruent with mood. ) Anxiety Level: None Thought Processes: Circumstantial Judgement: Impaired Orientation: Other (Comment)(day, year, city and state. ) Obsessive Compulsive Thoughts/Behaviors: None  Cognitive Functioning Concentration: Normal Memory: Recent Intact IQ: Average Insight: Poor Impulse Control: Poor Appetite: Good Sleep: No Change Total Hours of Sleep: 8 Vegetative Symptoms: None  ADLScreening [facility] Assessment Services) Patient's cognitive ability adequate to safely complete daily activities?: Yes Patient able to express need for assistance with ADLs?: Yes Independently performs ADLs?: Yes (appropriate for developmental age)  Prior Inpatient Therapy Prior Inpatient Therapy:  Yes Prior Therapy Dates: UTA. Prior Therapy Facilty/Provider(s): Per IVC, [facility].  Reason for Treatment: Bipolar and Schizophrenia.   Prior Outpatient Therapy Prior Outpatient Therapy:  Yes Prior Therapy Dates: Current Prior Therapy Facilty/Provider(s): [facility].  Reason for Treatment: Medication mangement and counseling.  Does patient have an ACCT team?: Unknown Does patient have Intensive In-House Services?  : No Does patient have [facility] services? : Unknown Does patient have P4CC services?: Unknown  ADL Screening (condition at time of admission) Patient's cognitive ability adequate to safely complete daily activities?: Yes Is the patient deaf or have difficulty hearing?: No Does the patient have difficulty seeing, even when wearing glasses/contacts?: No Does the patient have difficulty concentrating, remembering, or making decisions?: Yes(Pt wears glasses. ) Patient able to express need for assistance with ADLs?: Yes Does the patient have difficulty dressing or bathing?: No Independently performs ADLs?: Yes (appropriate for developmental age) Does the patient have difficulty walking or climbing stairs?: No Weakness of Legs: None Weakness of Arms/Hands: None  Home Assistive Devices/Equipment Home Assistive Devices/Equipment: Eyeglasses    Abuse/Neglect Assessment (Assessment to be complete while patient is alone) Abuse/Neglect Assessment Can Be Completed: Yes Physical Abuse: Denies(Pt denies. ) Verbal Abuse: Denies(Pt denies. ) Sexual Abuse: Denies(Pt denies.) Exploitation of patient/patient's resources: Denies(Pt denies.) Self-Neglect: Denies(Pt denies.)     Advance Directives (For Healthcare) Does Patient Have a Medical Advance Directive?: (UTA)    Additional Information 1:1 In Past 12 Months?: No CIRT Risk: No Elopement Risk: No Does patient have medical clearance?: Yes     Disposition: Nira ConnJason Berry, NP recommends inpatient treatment. Per Chyrl CivatteJoAnn, [facility]. Disposition discussed with Debarah Crapelaudia, PA and Belenda CruiseKristin, Charity fundraiserN. TTS to seek placement.    Disposition Initial Assessment Completed for this Encounter: Yes Disposition of  Patient: Inpatient treatment program Type of inpatient treatment program: Adult  On Site Evaluation by: Jenny Reichmannreylese Reneta Niehaus, MS, LPC, CRC. Reviewed with Physician:  Debarah Crapelaudia, PA and Nira ConnJason Berry, NP.  Redmond Pullingreylese D Morgann Woodburn 04/30/2017 10:07 PM   Redmond Pullingreylese D Roddrick Sharron, MS, [facility], [facility] Triage Specialist [phone]

## 2017-05-01 DIAGNOSIS — F1290 Cannabis use, unspecified, uncomplicated: Secondary | ICD-10-CM

## 2017-05-01 DIAGNOSIS — R4587 Impulsiveness: Secondary | ICD-10-CM

## 2017-05-01 DIAGNOSIS — R45850 Homicidal ideations: Secondary | ICD-10-CM

## 2017-05-01 DIAGNOSIS — F250 Schizoaffective disorder, bipolar type: Secondary | ICD-10-CM

## 2017-05-01 NOTE — Consult Note (Signed)
[facility] Consult   Reason for Consult: Aggressive behavior, threatening family Referring Physician:  EDP Patient Identification: Cody [name] MRN:  [id number] Principal Diagnosis: Schizoaffective disorder, bipolar type [facility]) Diagnosis:   Patient Active Problem List   Diagnosis Date Noted  . Cannabis use disorder, severe, dependence ([place]) [F12.20] 12/11/2014    Priority: High  . Schizoaffective disorder, bipolar type ([place]) [F25.0] 11/05/2014    Priority: High    Total Time spent with patient: 45 minutes  Subjective:   Cody [name] is a 26 y.o. male patient admitted with aggressive behavior.  HPI:  Patient with history of Schizoaffective disorder-bipolar type, Cannabis use disorder-severe who was IVC'd by his case manager due to increased hostility and aggressive behavior towards his family. Patient reportedly destroyed properties at his home in anger, he is non-compliant with his medications and has been self medicating with Cannabis. Patient denies psychosis or delusional thinking.  Past Psychiatric History: as above  Risk to Self: Suicidal Ideation: No(Pt denies. ) Suicidal Intent: No Is patient at risk for suicide?: No Suicidal Plan?: No Access to Means: No What has been your use of drugs/alcohol within the last 12 months?: Marijuana. How many times?: 0 Other Self Harm Risks: Pt denies.  Triggers for Past Attempts: None known Intentional Self Injurious Behavior: None(Pt denies. ) Risk to Others: Homicidal Ideation: No(Pt denies.) Thoughts of Harm to Others: No Current Homicidal Intent: No Current Homicidal Plan: No Access to Homicidal Means: No Identified Victim: NA History of harm to others?: (Per IVC.) Assessment of Violence: On admission Violent Behavior Description: Per IVC pt is hostile and aggressive towards family members. Does patient have access to weapons?: No(Pt denies. ) Criminal Charges Pending?: No Does patient have a court date:  No Prior Inpatient Therapy: Prior Inpatient Therapy: Yes Prior Therapy Dates: UTA. Prior Therapy Facilty/Provider(s): Per IVC, [facility].  Reason for Treatment: Bipolar and Schizophrenia.  Prior Outpatient Therapy: Prior Outpatient Therapy: Yes Prior Therapy Dates: Current Prior Therapy Facilty/Provider(s): [facility].  Reason for Treatment: Medication mangement and counseling.  Does patient have an ACCT team?: Unknown Does patient have Intensive In-House Services?  : No Does patient have [facility] services? : Unknown Does patient have P4CC services?: Unknown  Past Medical History:  Past Medical History:  Diagnosis Date  . Anxiety   . Mood disorder ([place])   . Schizophrenia ([place])    History reviewed. No pertinent surgical history. Family History: History reviewed. No pertinent family history. Family Psychiatric  History: Social History:  Social History   Substance and Sexual Activity  Alcohol Use Yes     Social History   Substance and Sexual Activity  Drug Use Yes  . Types: Marijuana    Social History   Socioeconomic History  . Marital status: Single    Spouse name: None  . Number of children: None  . Years of education: None  . Highest education level: None  Social Needs  . Financial resource strain: None  . Food insecurity - worry: None  . Food insecurity - inability: None  . Transportation needs - medical: None  . Transportation needs - non-medical: None  Occupational History  . None  Tobacco Use  . Smoking status: Smoker, Current Status Unknown  . Smokeless tobacco: Never Used  Substance and Sexual Activity  . Alcohol use: Yes  . Drug use: Yes    Types: Marijuana  . Sexual activity: Yes  Other Topics Concern  . None  Social History Narrative   **  Merged History Encounter **       Additional Social History:    Allergies:   Allergies  Allergen Reactions  . Penicillins Hives and Other (See Comments)    Has patient had a PCN reaction causing  immediate rash, facial/tongue/throat swelling, SOB or lightheadedness with hypotension: No Has patient had a PCN reaction causing severe rash involving mucus membranes or skin necrosis: No Has patient had a PCN reaction that required hospitalization No Has patient had a PCN reaction occurring within the last 10 years: No If all of the above answers are "NO", then may proceed with Cephalosporin use.    Labs:  Results for orders placed or performed during the hospital encounter of 04/30/17 (from the past 48 hour(s))  Comprehensive metabolic panel     Status: Abnormal   Collection Time: 04/30/17  5:50 PM  Result Value Ref Range   Sodium 138 135 - 145 mmol/L   Potassium 3.9 3.5 - 5.1 mmol/L   Chloride 104 101 - 111 mmol/L   CO2 25 22 - 32 mmol/L   Glucose, Bld 83 65 - 99 mg/dL   BUN 7 6 - 20 mg/dL   Creatinine, Ser 0.94 0.61 - 1.24 mg/dL   Calcium 9.6 8.9 - 10.3 mg/dL   Total Protein 7.1 6.5 - 8.1 g/dL   Albumin 4.4 3.5 - 5.0 g/dL   AST 20 15 - 41 U/L   ALT 14 (L) 17 - 63 U/L   Alkaline Phosphatase 53 38 - 126 U/L   Total Bilirubin 1.3 (H) 0.3 - 1.2 mg/dL   GFR calc non Af Amer >60 >60 mL/min   GFR calc Af Amer >60 >60 mL/min    Comment: (NOTE) The eGFR has been calculated using the CKD EPI equation. This calculation has not been validated in all clinical situations. eGFR's persistently <60 mL/min signify possible Chronic Kidney Disease.    Anion gap 9 5 - 15  Urine rapid drug screen (hosp performed)     Status: Abnormal   Collection Time: 04/30/17  5:50 PM  Result Value Ref Range   Opiates NONE DETECTED NONE DETECTED   Cocaine NONE DETECTED NONE DETECTED   Benzodiazepines NONE DETECTED NONE DETECTED   Amphetamines NONE DETECTED NONE DETECTED   Tetrahydrocannabinol POSITIVE (A) NONE DETECTED   Barbiturates NONE DETECTED NONE DETECTED    Comment:        DRUG SCREEN FOR MEDICAL PURPOSES ONLY.  IF CONFIRMATION IS NEEDED FOR ANY PURPOSE, NOTIFY LAB WITHIN 5 DAYS.         LOWEST DETECTABLE LIMITS FOR URINE DRUG SCREEN Drug Class       Cutoff (ng/mL) Amphetamine      1000 Barbiturate      200 Benzodiazepine   427 Tricyclics       062 Opiates          300 Cocaine          300 THC              50   CBC with Diff     Status: None   Collection Time: 04/30/17  5:50 PM  Result Value Ref Range   WBC 9.8 4.0 - 10.5 K/uL   RBC 4.48 4.22 - 5.81 MIL/uL   Hemoglobin 13.0 13.0 - 17.0 g/dL   HCT 39.1 39.0 - 52.0 %   MCV 87.3 78.0 - 100.0 fL   MCH 29.0 26.0 - 34.0 pg   MCHC 33.2 30.0 - 36.0 g/dL   RDW  13.1 11.5 - 15.5 %   Platelets 196 150 - 400 K/uL   Neutrophils Relative % 71 %   Neutro Abs 7.1 1.7 - 7.7 K/uL   Lymphocytes Relative 21 %   Lymphs Abs 2.0 0.7 - 4.0 K/uL   Monocytes Relative 7 %   Monocytes Absolute 0.6 0.1 - 1.0 K/uL   Eosinophils Relative 1 %   Eosinophils Absolute 0.1 0.0 - 0.7 K/uL   Basophils Relative 0 %   Basophils Absolute 0.0 0.0 - 0.1 K/uL  Ethanol     Status: None   Collection Time: 04/30/17  8:33 PM  Result Value Ref Range   Alcohol, Ethyl (B) <10 <10 mg/dL    Comment:        LOWEST DETECTABLE LIMIT FOR SERUM ALCOHOL IS 10 mg/dL FOR MEDICAL PURPOSES ONLY   Salicylate level     Status: None   Collection Time: 04/30/17  8:33 PM  Result Value Ref Range   Salicylate Lvl <0.0 2.8 - 30.0 mg/dL  Acetaminophen level     Status: Abnormal   Collection Time: 04/30/17  8:33 PM  Result Value Ref Range   Acetaminophen (Tylenol), Serum <10 (L) 10 - 30 ug/mL    Comment:        THERAPEUTIC CONCENTRATIONS VARY SIGNIFICANTLY. A RANGE OF 10-30 ug/mL MAY BE AN EFFECTIVE CONCENTRATION FOR MANY PATIENTS. HOWEVER, SOME ARE BEST TREATED AT CONCENTRATIONS OUTSIDE THIS RANGE. ACETAMINOPHEN CONCENTRATIONS >150 ug/mL AT 4 HOURS AFTER INGESTION AND >50 ug/mL AT 12 HOURS AFTER INGESTION ARE OFTEN ASSOCIATED WITH TOXIC REACTIONS.     Current Facility-Administered Medications  Medication Dose Route Frequency Provider Last Rate Last  Dose  . divalproex (DEPAKOTE) DR tablet 500 mg  500 mg Oral Q12H Kinnie Feil, PA-C   500 mg at 05/01/17 1029  . risperiDONE (RISPERDAL) tablet 0.5 mg  0.5 mg Oral BID Kinnie Feil, PA-C   0.5 mg at 05/01/17 1031  . thiamine (VITAMIN B-1) tablet 100 mg  100 mg Oral Daily Kinnie Feil, PA-C   100 mg at 05/01/17 1029   Or  . thiamine (B-1) injection 100 mg  100 mg Intravenous Daily Gibbons, Claudia J, PA-C      . traZODone (DESYREL) tablet 100 mg  100 mg Oral QHS PRN Kinnie Feil, PA-C   100 mg at 05/01/17 0005   Current Outpatient Medications  Medication Sig Dispense Refill  . divalproex (DEPAKOTE) 500 MG DR tablet Take 1 tablet (500 mg total) by mouth every 12 (twelve) hours. (Patient not taking: Reported on 04/30/2017) 60 tablet 0  . risperiDONE (RISPERDAL) 0.5 MG tablet Take 1 tablet (0.5 mg total) by mouth 2 (two) times daily. (Patient not taking: Reported on 04/30/2017) 60 tablet 0  . traZODone (DESYREL) 100 MG tablet Take 1 tablet (100 mg total) by mouth at bedtime as needed for sleep. (Patient not taking: Reported on 04/30/2017) 30 tablet 0    Musculoskeletal: Strength & Muscle Tone: within normal limits Gait & Station: normal Patient leans: N/A  Psychiatric Specialty Exam: Physical Exam  Psychiatric: His speech is normal. His affect is angry and labile. He is aggressive. Cognition and memory are normal. He expresses impulsivity. He expresses homicidal ideation.    Review of Systems  Constitutional: Negative.   HENT: Negative.   Eyes: Negative.   Respiratory: Negative.   Cardiovascular: Negative.   Gastrointestinal: Negative.   Genitourinary: Negative.   Musculoskeletal: Negative.   Skin: Negative.   Neurological: Negative.   Endo/Heme/Allergies:  Negative.   Psychiatric/Behavioral: Positive for substance abuse.    Blood pressure 115/65, pulse (!) 108, temperature 98.9 F (37.2 C), temperature source Oral, resp. rate 20, SpO2 95 %.There is no height  or weight on file to calculate BMI.  General Appearance: Casual  Eye Contact:  Good  Speech:  Clear and Coherent  Volume:  Normal  Mood:  Irritable  Affect:  Labile  Thought Process:  Coherent  Orientation:  Full (Time, Place, and Person)  Thought Content:  Logical  Suicidal Thoughts:  No  Homicidal Thoughts:  No  Memory:  Immediate;   Fair Recent;   Fair Remote;   Fair  Judgement:  Fair  Insight:  Fair  Psychomotor Activity:  Increased  Concentration:  Concentration: Fair and Attention Span: Fair  Recall:  [employer] of Knowledge:  Fair  Language:  Fair  Akathisia:  No  Handed:  Right  AIMS (if indicated):     Assets:  Communication Skills  ADL's:  Intact  Cognition:  WNL  Sleep:   fair     Treatment Plan Summary: Daily contact with patient to assess and evaluate symptoms and progress in treatment and Medication management Depakote 516m bid and Risperdal 0.5 mg bid for Bipolar  Disposition: Recommend psychiatric Inpatient admission when medically cleared.  ACorena Pilgrim MD 05/01/2017 10:58 AM

## 2017-05-01 NOTE — ED Notes (Signed)
Patient mother, Cody [name] [phone].  Patient has given written consent for ROI.

## 2017-05-01 NOTE — ED Notes (Signed)
SBAR Report received from previous nurse. Pt received calm and visible on unit. Pt denies current SI/ HI, A/V H, depression, anxiety, or pain at this time, and appears otherwise stable and free of distress. Pt reminded of camera surveillance, q 15 min rounds, and rules of the milieu. Will continue to assess. 

## 2017-05-02 DIAGNOSIS — F1210 Cannabis abuse, uncomplicated: Secondary | ICD-10-CM

## 2017-05-02 MED ORDER — DIVALPROEX SODIUM 500 MG PO DR TAB: 500.0000 mg | DELAYED_RELEASE_TABLET | Freq: Two times a day (BID) | ORAL | 0 refills | Status: DC

## 2017-05-02 MED ORDER — RISPERIDONE 0.5 MG PO TABS: 0.5000 mg | ORAL_TABLET | Freq: Two times a day (BID) | ORAL | 0 refills | Status: DC

## 2017-05-02 MED ORDER — TRAZODONE HCL 100 MG PO TABS: 100.0000 mg | ORAL_TABLET | Freq: Every evening | ORAL | 0 refills | Status: DC | PRN

## 2017-05-02 NOTE — [facility] Suicide Risk Assessment (Signed)
Suicide Risk Assessment  Discharge Assessment   [facility]   Principal Problem: Schizoaffective disorder, bipolar type [facility](HCC) Discharge Diagnoses:  Patient Active Problem List   Diagnosis Date Noted  . Schizoaffective disorder, bipolar type (HCC) [F25.0] 11/05/2014    Priority: High  . Cannabis use disorder, severe, dependence (HCC) [F12.20] 12/11/2014    Total Time spent with patient: 30 minutes   Musculoskeletal: Strength & Muscle Tone: within normal limits Gait & Station: normal Patient leans: N/A  Psychiatric Specialty Exam: Physical Exam  Constitutional: He is oriented to person, place, and time. He appears well-developed and well-nourished.  HENT:  Head: Normocephalic.  Neck: Normal range of motion.  Respiratory: Effort normal.  Musculoskeletal: Normal range of motion.  Neurological: He is alert and oriented to person, place, and time.  Psychiatric: He has a normal mood and affect. His speech is normal and behavior is normal. Judgment and thought content normal. Cognition and memory are normal.    Review of Systems  Constitutional: Negative.   HENT: Negative.   Eyes: Negative.   Respiratory: Negative.   Cardiovascular: Negative.   Gastrointestinal: Negative.   Genitourinary: Negative.   Musculoskeletal: Negative.   Skin: Negative.   Neurological: Negative.   Endo/Heme/Allergies: Negative.   Psychiatric/Behavioral: Negative.     Blood pressure 113/68, pulse 87, temperature 98.2 F (36.8 C), temperature source Oral, resp. rate 18, SpO2 98 %.There is no height or weight on file to calculate BMI.  General Appearance: Casual  Eye Contact:  Good  Speech:  Clear and Coherent  Volume:  Normal  Mood:  euthymic  Affect:  congruent  Thought Process:  Coherent  Orientation:  Full (Time, Place, and Person)  Thought Content:  Logical  Suicidal Thoughts:  No  Homicidal Thoughts:  No  Memory:  Immediate, good; recent, good; remote, good   Judgement:  Fair  Insight:  Fair  Psychomotor Activity:  WDL  Concentration:  Concentration and attention span, good  Recall:  Good  Fund of Knowledge:  Fair  Language:  Good  Akathisia:  No  Handed:  Right  AIMS (if indicated):     Assets:  Communication Skills  ADL's:  Intact  Cognition:  WNL  Sleep:   good    Mental Status Per Nursing Assessment::   On Admission:   IVC'd for aggression  Demographic Factors:  Male and Adolescent or young adult  Loss Factors: NA  Historical Factors: NA  Risk Reduction Factors:   Sense of responsibility to family, Living with another person, especially a relative, Positive social support and Positive therapeutic relationship  Continued Clinical Symptoms:  None  Cognitive Features That Contribute To Risk:  None    Suicide Risk:  Minimal: No identifiable suicidal ideation.  Patients presenting with no risk factors but with morbid ruminations; may be classified as minimal risk based on the severity of the depressive symptoms    Plan Of Care/Follow-up recommendations:  Activity:  as tolerated Diet:  heart healthy diet  Adna Nofziger, NP 05/02/2017, 1:30 PM

## 2017-05-02 NOTE — ED Notes (Signed)
Patient denies pain and is resting comfortably.  

## 2017-05-02 NOTE — ED Notes (Signed)
Educated pt on prescriptions and follow up appointments.

## 2017-05-02 NOTE — Consult Note (Signed)
[place] Psychiat[facility] Consult   Reason for Consult: Aggressive behavior, threatening family Referring Physician:  EDP Patient Identification: Cody [name] MRN:  [id number] Principal Diagnosis: Schizoaffective disorder, bipolar type [facility]) Diagnosis:   Patient Active Problem List   Diagnosis Date Noted  . Schizoaffective disorder, bipolar type ([place]) [F25.0] 11/05/2014    Priority: High  . Cannabis use disorder, severe, dependence ([place]) [F12.20] 12/11/2014    Total Time spent with patient: 30 minutes  Subjective:   Cody [name] is a 26 y.o. male patient does not warrant admission  HPI:  Patient with history of Schizoaffective disorder-bipolar type, Cannabis use disorder-severe who was IVC'd by his case manager due to increased hostility and aggressive behavior towards his family. The patient denies all allegations and said he was sent here for a "check-up".  He says his brother saw an empty bottle of medication and thought he was out, not taking any but this was not the case.  He has been calm and cooperative since admission.  Denies suicidal/homicidal ideations, hallucinations, and withdrawal symptoms.  Stable for discharge, follow-up with his ACT team [facility]  Past Psychiatric History: schizoaffective disorder  Risk to Self: Suicidal Ideation: No(Pt denies. ) Suicidal Intent: No Is patient at risk for suicide?: No Suicidal Plan?: No Access to Means: No What has been your use of drugs/alcohol within the last 12 months?: Marijuana. How many times?: 0 Other Self Harm Risks: Pt denies.  Triggers for Past Attempts: None known Intentional Self Injurious Behavior: None(Pt denies. ) Risk to Others: Homicidal Ideation: No(Pt denies.) Thoughts of Harm to Others: No Current Homicidal Intent: No Current Homicidal Plan: No Access to Homicidal Means: No Identified Victim: NA History of harm to others?: (Per IVC.) Assessment of Violence: On admission Violent Behavior  Description: Per IVC pt is hostile and aggressive towards family members. Does patient have access to weapons?: No(Pt denies. ) Criminal Charges Pending?: No Does patient have a court date: No Prior Inpatient Therapy: Prior Inpatient Therapy: Yes Prior Therapy Dates: UTA. Prior Therapy Facilty/Provider(s): Per IVC, [facility].  Reason for Treatment: Bipolar and Schizophrenia.  Prior Outpatient Therapy: Prior Outpatient Therapy: Yes Prior Therapy Dates: Current Prior Therapy Facilty/Provider(s): [facility].  Reason for Treatment: Medication mangement and counseling.  Does patient have an ACCT team?: Unknown Does patient have Intensive In-House Services?  : No Does patient have [facility] services? : Unknown Does patient have P4CC services?: Unknown  Past Medical History:  Past Medical History:  Diagnosis Date  . Anxiety   . Mood disorder ([place])   . Schizophrenia ([place])    History reviewed. No pertinent surgical history. Family History: History reviewed. No pertinent family history. Family Psychiatric  History: Social History:  Social History   Substance and Sexual Activity  Alcohol Use Yes     Social History   Substance and Sexual Activity  Drug Use Yes  . Types: Marijuana    Social History   Socioeconomic History  . Marital status: Single    Spouse name: None  . Number of children: None  . Years of education: None  . Highest education level: None  Social Needs  . Financial resource strain: None  . Food insecurity - worry: None  . Food insecurity - inability: None  . Transportation needs - medical: None  . Transportation needs - non-medical: None  Occupational History  . None  Tobacco Use  . Smoking status: Smoker, Current Status Unknown  . Smokeless tobacco: Never Used  Substance and Sexual  Activity  . Alcohol use: Yes  . Drug use: Yes    Types: Marijuana  . Sexual activity: Yes  Other Topics Concern  . None  Social History Narrative   ** Merged History  Encounter **       Additional Social History:    Allergies:   Allergies  Allergen Reactions  . Penicillins Hives and Other (See Comments)    Has patient had a PCN reaction causing immediate rash, facial/tongue/throat swelling, SOB or lightheadedness with hypotension: No Has patient had a PCN reaction causing severe rash involving mucus membranes or skin necrosis: No Has patient had a PCN reaction that required hospitalization No Has patient had a PCN reaction occurring within the last 10 years: No If all of the above answers are "NO", then may proceed with Cephalosporin use.    Labs:  Results for orders placed or performed during the hospital encounter of 04/30/17 (from the past 48 hour(s))  Comprehensive metabolic panel     Status: Abnormal   Collection Time: 04/30/17  5:50 PM  Result Value Ref Range   Sodium 138 135 - 145 mmol/L   Potassium 3.9 3.5 - 5.1 mmol/L   Chloride 104 101 - 111 mmol/L   CO2 25 22 - 32 mmol/L   Glucose, Bld 83 65 - 99 mg/dL   BUN 7 6 - 20 mg/dL   Creatinine, Ser 0.94 0.61 - 1.24 mg/dL   Calcium 9.6 8.9 - 10.3 mg/dL   Total Protein 7.1 6.5 - 8.1 g/dL   Albumin 4.4 3.5 - 5.0 g/dL   AST 20 15 - 41 U/L   ALT 14 (L) 17 - 63 U/L   Alkaline Phosphatase 53 38 - 126 U/L   Total Bilirubin 1.3 (H) 0.3 - 1.2 mg/dL   GFR calc non Af Amer >60 >60 mL/min   GFR calc Af Amer >60 >60 mL/min    Comment: (NOTE) The eGFR has been calculated using the CKD EPI equation. This calculation has not been validated in all clinical situations. eGFR's persistently <60 mL/min signify possible Chronic Kidney Disease.    Anion gap 9 5 - 15  Urine rapid drug screen (hosp performed)     Status: Abnormal   Collection Time: 04/30/17  5:50 PM  Result Value Ref Range   Opiates NONE DETECTED NONE DETECTED   Cocaine NONE DETECTED NONE DETECTED   Benzodiazepines NONE DETECTED NONE DETECTED   Amphetamines NONE DETECTED NONE DETECTED   Tetrahydrocannabinol POSITIVE (A) NONE DETECTED    Barbiturates NONE DETECTED NONE DETECTED    Comment:        DRUG SCREEN FOR MEDICAL PURPOSES ONLY.  IF CONFIRMATION IS NEEDED FOR ANY PURPOSE, NOTIFY LAB WITHIN 5 DAYS.        LOWEST DETECTABLE LIMITS FOR URINE DRUG SCREEN Drug Class       Cutoff (ng/mL) Amphetamine      1000 Barbiturate      200 Benzodiazepine   330 Tricyclics       076 Opiates          300 Cocaine          300 THC              50   CBC with Diff     Status: None   Collection Time: 04/30/17  5:50 PM  Result Value Ref Range   WBC 9.8 4.0 - 10.5 K/uL   RBC 4.48 4.22 - 5.81 MIL/uL   Hemoglobin 13.0 13.0 - 17.0 g/dL  HCT 39.1 39.0 - 52.0 %   MCV 87.3 78.0 - 100.0 fL   MCH 29.0 26.0 - 34.0 pg   MCHC 33.2 30.0 - 36.0 g/dL   RDW 13.1 11.5 - 15.5 %   Platelets 196 150 - 400 K/uL   Neutrophils Relative % 71 %   Neutro Abs 7.1 1.7 - 7.7 K/uL   Lymphocytes Relative 21 %   Lymphs Abs 2.0 0.7 - 4.0 K/uL   Monocytes Relative 7 %   Monocytes Absolute 0.6 0.1 - 1.0 K/uL   Eosinophils Relative 1 %   Eosinophils Absolute 0.1 0.0 - 0.7 K/uL   Basophils Relative 0 %   Basophils Absolute 0.0 0.0 - 0.1 K/uL  Ethanol     Status: None   Collection Time: 04/30/17  8:33 PM  Result Value Ref Range   Alcohol, Ethyl (B) <10 <10 mg/dL    Comment:        LOWEST DETECTABLE LIMIT FOR SERUM ALCOHOL IS 10 mg/dL FOR MEDICAL PURPOSES ONLY   Salicylate level     Status: None   Collection Time: 04/30/17  8:33 PM  Result Value Ref Range   Salicylate Lvl <2.2 2.8 - 30.0 mg/dL  Acetaminophen level     Status: Abnormal   Collection Time: 04/30/17  8:33 PM  Result Value Ref Range   Acetaminophen (Tylenol), Serum <10 (L) 10 - 30 ug/mL    Comment:        THERAPEUTIC CONCENTRATIONS VARY SIGNIFICANTLY. A RANGE OF 10-30 ug/mL MAY BE AN EFFECTIVE CONCENTRATION FOR MANY PATIENTS. HOWEVER, SOME ARE BEST TREATED AT CONCENTRATIONS OUTSIDE THIS RANGE. ACETAMINOPHEN CONCENTRATIONS >150 ug/mL AT 4 HOURS AFTER INGESTION AND >50 ug/mL  AT 12 HOURS AFTER INGESTION ARE OFTEN ASSOCIATED WITH TOXIC REACTIONS.     Current Facility-Administered Medications  Medication Dose Route Frequency Provider Last Rate Last Dose  . divalproex (DEPAKOTE) DR tablet 500 mg  500 mg Oral Q12H Kinnie Feil, PA-C   500 mg at 05/02/17 0254  . risperiDONE (RISPERDAL) tablet 0.5 mg  0.5 mg Oral BID Kinnie Feil, PA-C   0.5 mg at 05/02/17 2706  . thiamine (VITAMIN B-1) tablet 100 mg  100 mg Oral Daily Kinnie Feil, PA-C   100 mg at 05/02/17 2376   Or  . thiamine (B-1) injection 100 mg  100 mg Intravenous Daily Gibbons, Claudia J, PA-C      . traZODone (DESYREL) tablet 100 mg  100 mg Oral QHS PRN Kinnie Feil, PA-C   100 mg at 05/01/17 0005   Current Outpatient Medications  Medication Sig Dispense Refill  . divalproex (DEPAKOTE) 500 MG DR tablet Take 1 tablet (500 mg total) by mouth every 12 (twelve) hours. (Patient not taking: Reported on 04/30/2017) 60 tablet 0  . risperiDONE (RISPERDAL) 0.5 MG tablet Take 1 tablet (0.5 mg total) by mouth 2 (two) times daily. (Patient not taking: Reported on 04/30/2017) 60 tablet 0  . traZODone (DESYREL) 100 MG tablet Take 1 tablet (100 mg total) by mouth at bedtime as needed for sleep. (Patient not taking: Reported on 04/30/2017) 30 tablet 0    Musculoskeletal: Strength & Muscle Tone: within normal limits Gait & Station: normal Patient leans: N/A  Psychiatric Specialty Exam: Physical Exam  Constitutional: He is oriented to person, place, and time. He appears well-developed and well-nourished.  HENT:  Head: Normocephalic.  Neck: Normal range of motion.  Respiratory: Effort normal.  Musculoskeletal: Normal range of motion.  Neurological: He is alert and  oriented to person, place, and time.  Psychiatric: He has a normal mood and affect. His speech is normal and behavior is normal. Judgment and thought content normal. Cognition and memory are normal.    Review of Systems   Constitutional: Negative.   HENT: Negative.   Eyes: Negative.   Respiratory: Negative.   Cardiovascular: Negative.   Gastrointestinal: Negative.   Genitourinary: Negative.   Musculoskeletal: Negative.   Skin: Negative.   Neurological: Negative.   Endo/Heme/Allergies: Negative.   Psychiatric/Behavioral: Negative.     Blood pressure 113/68, pulse 87, temperature 98.2 F (36.8 C), temperature source Oral, resp. rate 18, SpO2 98 %.There is no height or weight on file to calculate BMI.  General Appearance: Casual  Eye Contact:  Good  Speech:  Clear and Coherent  Volume:  Normal  Mood:  euthymic  Affect:  congruent  Thought Process:  Coherent  Orientation:  Full (Time, Place, and Person)  Thought Content:  Logical  Suicidal Thoughts:  No  Homicidal Thoughts:  No  Memory:  Immediate, good; recent, good; remote, good  Judgement:  Fair  Insight:  Fair  Psychomotor Activity:  WDL  Concentration:  Concentration and attention span, good  Recall:  Good  Fund of Knowledge:  Fair  Language:  Good  Akathisia:  No  Handed:  Right  AIMS (if indicated):     Assets:  Communication Skills  ADL's:  Intact  Cognition:  WNL  Sleep:   good     Treatment Plan Summary: Daily contact with patient to assess and evaluate symptoms and progress in treatment and Medication management Bipolar affective disorder -Crisis stabilization -Medication management: Depakote 566m bid and Risperdal 0.5 mg bid for Bipolar -Individual counseling  Disposition: Discharge home  LWaylan Boga NP 05/02/2017 10:33 AM  Patient seen face-to-face for psychiatric evaluation, chart reviewed and case discussed with the physician extender and developed treatment plan. Reviewed the information documented and agree with the treatment plan. MCorena Pilgrim MD

## 2017-06-18 ENCOUNTER — Emergency Department (HOSPITAL_COMMUNITY)
Admission: EM | Admit: 2017-06-18 | Discharge: 2017-06-22 | Disposition: A | Discharge status: Home / Self Care | Payer: Self-pay | Attending: Emergency Medicine | Admitting: Emergency Medicine

## 2017-06-18 ENCOUNTER — Encounter (HOSPITAL_COMMUNITY): Payer: Self-pay

## 2017-06-18 ENCOUNTER — Other Ambulatory Visit: Payer: Self-pay

## 2017-06-18 DIAGNOSIS — Z046 Encounter for general psychiatric examination, requested by authority: Secondary | ICD-10-CM

## 2017-06-18 DIAGNOSIS — Z008 Encounter for other general examination: Secondary | ICD-10-CM

## 2017-06-18 DIAGNOSIS — F250 Schizoaffective disorder, bipolar type: Secondary | ICD-10-CM

## 2017-06-18 DIAGNOSIS — F17200 Nicotine dependence, unspecified, uncomplicated: Secondary | ICD-10-CM | POA: Insufficient documentation

## 2017-06-18 DIAGNOSIS — Z9114 Patient's other noncompliance with medication regimen: Secondary | ICD-10-CM | POA: Insufficient documentation

## 2017-06-18 DIAGNOSIS — Z720 Tobacco use: Secondary | ICD-10-CM

## 2017-06-18 DIAGNOSIS — F1210 Cannabis abuse, uncomplicated: Secondary | ICD-10-CM | POA: Insufficient documentation

## 2017-06-18 DIAGNOSIS — F1290 Cannabis use, unspecified, uncomplicated: Secondary | ICD-10-CM

## 2017-06-18 DIAGNOSIS — F911 Conduct disorder, childhood-onset type: Secondary | ICD-10-CM | POA: Insufficient documentation

## 2017-06-18 DIAGNOSIS — R4689 Other symptoms and signs involving appearance and behavior: Secondary | ICD-10-CM

## 2017-06-18 DIAGNOSIS — F209 Schizophrenia, unspecified: Secondary | ICD-10-CM | POA: Insufficient documentation

## 2017-06-18 DIAGNOSIS — F259 Schizoaffective disorder, unspecified: Secondary | ICD-10-CM | POA: Diagnosis present

## 2017-06-18 LAB — COMPREHENSIVE METABOLIC PANEL
ALT: 18 U/L (ref 17–63)
AST: 35 U/L (ref 15–41)
Albumin: 5.3 g/dL — ABNORMAL HIGH (ref 3.5–5.0)
Alkaline Phosphatase: 64 U/L (ref 38–126)
Anion gap: 11 (ref 5–15)
BUN: 13 mg/dL (ref 6–20)
CHLORIDE: 100 mmol/L — AB (ref 101–111)
CO2: 26 mmol/L (ref 22–32)
Calcium: 10 mg/dL (ref 8.9–10.3)
Creatinine, Ser: 0.95 mg/dL (ref 0.61–1.24)
Glucose, Bld: 134 mg/dL — ABNORMAL HIGH (ref 65–99)
POTASSIUM: 3.7 mmol/L (ref 3.5–5.1)
Sodium: 137 mmol/L (ref 135–145)
TOTAL PROTEIN: 8.7 g/dL — AB (ref 6.5–8.1)
Total Bilirubin: 1.2 mg/dL (ref 0.3–1.2)

## 2017-06-18 LAB — RAPID URINE DRUG SCREEN, HOSP PERFORMED
Amphetamines: NOT DETECTED
BENZODIAZEPINES: NOT DETECTED
Barbiturates: NOT DETECTED
Cocaine: NOT DETECTED
Opiates: NOT DETECTED
Tetrahydrocannabinol: POSITIVE — AB

## 2017-06-18 LAB — SALICYLATE LEVEL

## 2017-06-18 LAB — CBC
HCT: 44.6 % (ref 39.0–52.0)
Hemoglobin: 15.2 g/dL (ref 13.0–17.0)
MCH: 29.5 pg (ref 26.0–34.0)
MCHC: 34.1 g/dL (ref 30.0–36.0)
MCV: 86.6 fL (ref 78.0–100.0)
PLATELETS: 205 10*3/uL (ref 150–400)
RBC: 5.15 MIL/uL (ref 4.22–5.81)
RDW: 13 % (ref 11.5–15.5)
WBC: 16.3 10*3/uL — AB (ref 4.0–10.5)

## 2017-06-18 LAB — ACETAMINOPHEN LEVEL: Acetaminophen (Tylenol), Serum: <10 ug/mL — ABNORMAL LOW (ref 10–30)

## 2017-06-18 LAB — ETHANOL

## 2017-06-18 MED ORDER — STERILE WATER FOR INJECTION IJ SOLN
INTRAMUSCULAR | Status: AC
  Administered 2017-06-18: 23:00:00
  Filled 2017-06-18: qty 10

## 2017-06-18 MED ORDER — ZOLPIDEM TARTRATE 5 MG PO TABS: 5.0000 mg | ORAL_TABLET | Freq: Every evening | ORAL | Status: DC | PRN

## 2017-06-18 MED ORDER — NICOTINE 21 MG/24HR TD PT24
21.0000 mg | MEDICATED_PATCH | Freq: Every day | TRANSDERMAL | Status: DC
  Administered 2017-06-19 – 2017-06-22 (×3): 21 mg via TRANSDERMAL
  Filled 2017-06-18 (×3): qty 1

## 2017-06-18 MED ORDER — ONDANSETRON HCL 4 MG PO TABS: 4.0000 mg | ORAL_TABLET | Freq: Three times a day (TID) | ORAL | Status: DC | PRN

## 2017-06-18 MED ORDER — ALUM & MAG HYDROXIDE-SIMETH 200-200-20 MG/5ML PO SUSP: 30.0000 mL | Freq: Four times a day (QID) | ORAL | Status: DC | PRN

## 2017-06-18 MED ORDER — ACETAMINOPHEN 325 MG PO TABS
650.0000 mg | ORAL_TABLET | ORAL | Status: DC | PRN
  Administered 2017-06-19: 650 mg via ORAL
  Filled 2017-06-18: qty 2

## 2017-06-18 MED ORDER — ZIPRASIDONE MESYLATE 20 MG IM SOLR
INTRAMUSCULAR | Status: AC
  Administered 2017-06-18: 23:00:00
  Filled 2017-06-18: qty 20

## 2017-06-18 NOTE — ED Notes (Signed)
1 bag of belongings locker 28

## 2017-06-18 NOTE — ED Notes (Signed)
Cody [name] [phone] (mother)

## 2017-06-18 NOTE — ED Notes (Signed)
Call from TTS pt to be admitted but no bed is available at this time. TTS seeking placement.
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
## 2017-06-18 NOTE — ED Notes (Signed)
1 black trash bag and 1 belonging bag are placed in locker 29 in TCU
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
## 2017-06-19 DIAGNOSIS — F1910 Other psychoactive substance abuse, uncomplicated: Secondary | ICD-10-CM

## 2017-06-19 DIAGNOSIS — F17210 Nicotine dependence, cigarettes, uncomplicated: Secondary | ICD-10-CM

## 2017-06-19 DIAGNOSIS — F419 Anxiety disorder, unspecified: Secondary | ICD-10-CM

## 2017-06-19 DIAGNOSIS — F39 Unspecified mood [affective] disorder: Secondary | ICD-10-CM

## 2017-06-19 DIAGNOSIS — F250 Schizoaffective disorder, bipolar type: Secondary | ICD-10-CM

## 2017-06-19 DIAGNOSIS — R450 Nervousness: Secondary | ICD-10-CM

## 2017-06-19 DIAGNOSIS — F1210 Cannabis abuse, uncomplicated: Secondary | ICD-10-CM

## 2017-06-19 MED ORDER — DIVALPROEX SODIUM 500 MG PO DR TAB
500.0000 mg | DELAYED_RELEASE_TABLET | Freq: Two times a day (BID) | ORAL | Status: DC
  Administered 2017-06-19 – 2017-06-22 (×5): 500 mg via ORAL
  Filled 2017-06-19 (×6): qty 1

## 2017-06-19 MED ORDER — HYDROXYZINE HCL 25 MG PO TABS
25.0000 mg | ORAL_TABLET | Freq: Two times a day (BID) | ORAL | Status: DC
  Administered 2017-06-19 – 2017-06-22 (×5): 25 mg via ORAL
  Filled 2017-06-19 (×6): qty 1

## 2017-06-19 MED ORDER — RISPERIDONE 1 MG PO TABS
1.0000 mg | ORAL_TABLET | Freq: Two times a day (BID) | ORAL | Status: DC
  Administered 2017-06-19 – 2017-06-22 (×5): 1 mg via ORAL
  Filled 2017-06-19 (×6): qty 1

## 2017-06-19 NOTE — ED Notes (Addendum)
Pt was walking in hallway without scrub top on. This Clinical research associatewriter prompted patient to please wear scrub top when walking in hallway. Patient began talking about how he was sweating and showed this Clinical research associatewriter how he was sweating on his chest. Patient then asked this Clinical research associatewriter for a smoking patch. Patient then dropped his pants and underwear to the floor and shook his hips at this Clinical research associatewriter. Patient was prompted to stop exposing himself, at that point patient turned around and bent over again exposing his back side to this Clinical research associatewriter. Patient was prompted again to pull his pants up. Patients RN was notified of this situation, as was the doctor.

## 2017-06-19 NOTE — B[facility] Counselor (Signed)
Clinician faxed the pt to the following inpatient treatment facilities:     [facility].  [facility].  [facility].  [facility].  [facility]  [facility].  [facility].      TTS will continue to seek placement.    Anthonie Lotito D Anayelli Lai, MS, LPC, CRC Triage Specialist [phone]  

## 2017-06-19 NOTE — ED Notes (Signed)
Patient admitted to unit. Appear a little hyperactive and couldn't respond to any questions. Continues to laugh hysterically to every questions asked. Pacing his room and hallway at this time. Will continue to monitor patient. Safety checks maintained.
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
## 2017-06-19 NOTE — ED Notes (Signed)
SBAR Report received from previous nurse. Pt received calm and visible on unit. Pt denies current SI/ HI, A/V H, depression, anxiety at this time, and appears otherwise stable and free of distress. Pt reminded of camera surveillance, q 15 min rounds, and rules of the milieu. Will continue to assess.

## 2017-06-19 NOTE — ED Notes (Signed)
Patient taking a shower.

## 2017-06-20 MED ORDER — ZIPRASIDONE MESYLATE 20 MG IM SOLR: 20.0000 mg | Freq: Once | INTRAMUSCULAR | Status: DC

## 2017-06-20 MED ORDER — DIPHENHYDRAMINE HCL 50 MG/ML IJ SOLN: 50.0000 mg | Freq: Once | INTRAMUSCULAR | Status: DC

## 2017-06-20 MED ORDER — LORAZEPAM 2 MG/ML IJ SOLN
2.0000 mg | Freq: Once | INTRAMUSCULAR | Status: DC
Start: 2017-06-20 — End: 2017-06-20

## 2017-06-20 NOTE — Consult Note (Signed)
[facility] Consult   Reason for Consult:  Aggressive behavior  Referring Physician:  EDP Patient Identification: Cody [name] MRN:  [id number] Principal Diagnosis: Schizoaffective disorder, bipolar type [facility]) Diagnosis:   Patient Active Problem List   Diagnosis Date Noted  . Cannabis use disorder, severe, dependence ([place]) [F12.20] 12/11/2014  . Schizoaffective disorder, bipolar type ([place]) [F25.0] 11/05/2014    Total Time spent with patient: 30 minutes  Subjective:   Cody [name] is a 27 y.o. male patient admitted with aggressive behavior at home.  HPI:  Pt was seen and chart reviewed with treatment team and Dr Darleene Cleaver. Pt stated he feels better today and has been taking his medications.  Pt denies suicidal/homicidal ideation, denies auditory/visual hallucinations and does not appear to be responding to internal stimuli. Pt stated he can return to his mother's house upon discharge. Pt is calm and cooperative and appropriate on the unit today. Collateral will be obtained from Pt's mother prior to discharge. Attempted to contact Pt's mother but no answer. Pt gave the phone number [phone] for someone named Cody [name]. Per staff RN, Pt is taking 6 showers per day, insisting he needs a sex therapist, and making comments about pubic hair.   Spoke with Pt's mother, Cody [name] [phone], who stated "Cody [name] has not been getting his full dose of Haldol injectable from his ACT team. Pt is supposed to receive 100 mg Haldol IM and has been only receiving 75 mg. Pt is manic and just needs to be stabilized and has a new ACT team in place for Wednesday. Pt's mother stated she does not know who this person named Cody [name] is. Pt's brothers and I are afraid of him when he is manic.    Past Psychiatric History: As above  Risk to Self: None Risk to Others: None Prior Inpatient Therapy: Prior Inpatient Therapy: Yes(Per chart.) Prior Therapy Dates: (UTA) Prior Therapy  Facilty/Provider(s): Per chart, [facility].  Reason for Treatment: Bipolar and Schizophrenia.  Prior Outpatient Therapy: Prior Outpatient Therapy: Yes Prior Therapy Dates: Current Prior Therapy Facilty/Provider(s): [facility].  Reason for Treatment: Per chart, medication mangement and counseling.  Does patient have an ACCT team?: No Does patient have Intensive In-House Services?  : No Does patient have [facility] services? : No Does patient have P4CC services?: No  Past Medical History:  Past Medical History:  Diagnosis Date  . Anxiety   . Mood disorder ([place])   . Schizophrenia ([place])    History reviewed. No pertinent surgical history. Family History: History reviewed. No pertinent family history. Family Psychiatric  History: Unknown Social History:  Social History   Substance and Sexual Activity  Alcohol Use Yes     Social History   Substance and Sexual Activity  Drug Use Yes  . Types: Marijuana    Social History   Socioeconomic History  . Marital status: Single    Spouse name: None  . Number of children: None  . Years of education: None  . Highest education level: None  Social Needs  . Financial resource strain: None  . Food insecurity - worry: None  . Food insecurity - inability: None  . Transportation needs - medical: None  . Transportation needs - non-medical: None  Occupational History  . None  Tobacco Use  . Smoking status: Smoker, Current Status Unknown  . Smokeless tobacco: Never Used  Substance and Sexual Activity  . Alcohol use: Yes  . Drug use: Yes    Types: Marijuana  . Sexual activity:  Yes  Other Topics Concern  . None  Social History Narrative   ** Merged History Encounter **       Additional Social History:    Allergies:   Allergies  Allergen Reactions  . Penicillins Hives and Other (See Comments)     Has patient had a PCN reaction causing immediate rash, facial/tongue/throat swelling, SOB or lightheadedness with hypotension: No Has  patient had a PCN reaction causing severe rash involving mucus membranes or skin necrosis: No Has patient had a PCN reaction that required hospitalization No Has patient had a PCN reaction occurring within the last 10 years: no If all of the above answers are "NO", then may proceed with Cephalosporin use.    Labs:  Results for orders placed or performed during the hospital encounter of 06/18/17 (from the past 48 hour(s))  Comprehensive metabolic panel     Status: Abnormal   Collection Time: 06/18/17  6:42 PM  Result Value Ref Range   Sodium 137 135 - 145 mmol/L   Potassium 3.7 3.5 - 5.1 mmol/L   Chloride 100 (L) 101 - 111 mmol/L   CO2 26 22 - 32 mmol/L   Glucose, Bld 134 (H) 65 - 99 mg/dL   BUN 13 6 - 20 mg/dL   Creatinine, Ser 0.95 0.61 - 1.24 mg/dL   Calcium 10.0 8.9 - 10.3 mg/dL   Total Protein 8.7 (H) 6.5 - 8.1 g/dL   Albumin 5.3 (H) 3.5 - 5.0 g/dL   AST 35 15 - 41 U/L   ALT 18 17 - 63 U/L   Alkaline Phosphatase 64 38 - 126 U/L   Total Bilirubin 1.2 0.3 - 1.2 mg/dL   GFR calc non Af Amer >60 >60 mL/min   GFR calc Af Amer >60 >60 mL/min    Comment: (NOTE) The eGFR has been calculated using the CKD EPI equation. This calculation has not been validated in all clinical situations. eGFR's persistently <60 mL/min signify possible Chronic Kidney Disease.    Anion gap 11 5 - 15  Ethanol     Status: None   Collection Time: 06/18/17  6:42 PM  Result Value Ref Range   Alcohol, Ethyl (B) <10 <10 mg/dL    Comment:        LOWEST DETECTABLE LIMIT FOR SERUM ALCOHOL IS 10 mg/dL FOR MEDICAL PURPOSES ONLY   Salicylate level     Status: None   Collection Time: 06/18/17  6:42 PM  Result Value Ref Range   Salicylate Lvl <6.7 2.8 - 30.0 mg/dL  Acetaminophen level     Status: Abnormal   Collection Time: 06/18/17  6:42 PM  Result Value Ref Range   Acetaminophen (Tylenol), Serum <10 (L) 10 - 30 ug/mL    Comment:        THERAPEUTIC CONCENTRATIONS VARY SIGNIFICANTLY. A RANGE OF  10-30 ug/mL MAY BE AN EFFECTIVE CONCENTRATION FOR MANY PATIENTS. HOWEVER, SOME ARE BEST TREATED AT CONCENTRATIONS OUTSIDE THIS RANGE. ACETAMINOPHEN CONCENTRATIONS >150 ug/mL AT 4 HOURS AFTER INGESTION AND >50 ug/mL AT 12 HOURS AFTER INGESTION ARE OFTEN ASSOCIATED WITH TOXIC REACTIONS.   cbc     Status: Abnormal   Collection Time: 06/18/17  6:42 PM  Result Value Ref Range   WBC 16.3 (H) 4.0 - 10.5 K/uL   RBC 5.15 4.22 - 5.81 MIL/uL   Hemoglobin 15.2 13.0 - 17.0 g/dL   HCT 44.6 39.0 - 52.0 %   MCV 86.6 78.0 - 100.0 fL   MCH 29.5 26.0 - 34.0 pg  MCHC 34.1 30.0 - 36.0 g/dL   RDW 13.0 11.5 - 15.5 %   Platelets 205 150 - 400 K/uL  Rapid urine drug screen (hospital performed)     Status: Abnormal   Collection Time: 06/18/17  6:48 PM  Result Value Ref Range   Opiates NONE DETECTED NONE DETECTED   Cocaine NONE DETECTED NONE DETECTED   Benzodiazepines NONE DETECTED NONE DETECTED   Amphetamines NONE DETECTED NONE DETECTED   Tetrahydrocannabinol POSITIVE (A) NONE DETECTED   Barbiturates NONE DETECTED NONE DETECTED    Comment: (NOTE) DRUG SCREEN FOR MEDICAL PURPOSES ONLY.  IF CONFIRMATION IS NEEDED FOR ANY PURPOSE, NOTIFY LAB WITHIN 5 DAYS. LOWEST DETECTABLE LIMITS FOR URINE DRUG SCREEN Drug Class                     Cutoff (ng/mL) Amphetamine and metabolites    1000 Barbiturate and metabolites    200 Benzodiazepine                 267 Tricyclics and metabolites     300 Opiates and metabolites        300 Cocaine and metabolites        300 THC                            50     Current Facility-Administered Medications  Medication Dose Route Frequency Provider Last Rate Last Dose  . acetaminophen (TYLENOL) tablet 650 mg  650 mg Oral Q4H PRN Street, [place], [place]   650 mg at 06/19/17 1930  . alum & mag hydroxide-simeth (MAALOX/MYLANTA) 200-200-20 MG/5ML suspension 30 mL  30 mL Oral Q6H PRN Street, [place], [place]      . divalproex (DEPAKOTE) DR tablet 500 mg  500 mg Oral Q12H  Dorse Locy, MD   500 mg at 06/20/17 1006  . hydrOXYzine (ATARAX/VISTARIL) tablet 25 mg  25 mg Oral BID Darleene Cleaver, Laquincy Eastridge, MD   25 mg at 06/20/17 1006  . nicotine (NICODERM CQ - dosed in mg/24 hours) patch 21 mg  21 mg Transdermal Daily [address], [place], [place]   21 mg at 06/20/17 1010  . ondansetron (ZOFRAN) tablet 4 mg  4 mg Oral Q8H PRN Street, [place], [place]      . risperiDONE (RISPERDAL) tablet 1 mg  1 mg Oral BID Aquan Kope, MD   1 mg at 06/20/17 1006   Current Outpatient Medications  Medication Sig Dispense Refill  . divalproex (DEPAKOTE) 500 MG DR tablet Take 1 tablet (500 mg total) every 12 (twelve) hours by mouth. (Patient not taking: Reported on 06/18/2017) 60 tablet 0  . risperiDONE (RISPERDAL) 0.5 MG tablet Take 1 tablet (0.5 mg total) 2 (two) times daily by mouth. (Patient not taking: Reported on 06/18/2017) 60 tablet 0  . traZODone (DESYREL) 100 MG tablet Take 1 tablet (100 mg total) at bedtime as needed by mouth for sleep. (Patient not taking: Reported on 06/18/2017) 30 tablet 0    Musculoskeletal: Strength & Muscle Tone: within normal limits Gait & Station: normal Patient leans: N/A  Psychiatric Specialty Exam: Physical Exam  Constitutional: He appears well-developed and well-nourished.  HENT:  Head: Normocephalic.  Respiratory: Effort normal.  Musculoskeletal: Normal range of motion.  Neurological: He is alert.  Skin: No rash noted.  Psychiatric: His behavior is normal. Thought content normal. His mood appears anxious. His speech is rapid and/or pressured. Cognition and memory are normal. He expresses impulsivity.  Review of Systems  Psychiatric/Behavioral: Positive for hallucinations and substance abuse. Negative for depression, memory loss and suicidal ideas. The patient is nervous/anxious. The patient does not have insomnia.   All other systems reviewed and are negative.   Blood pressure 135/89, pulse 98, temperature 98.6 F (37 C), resp. rate 18, height 5'  10" (1.778 m), weight 74.8 kg (165 lb), SpO2 99 %.Body mass index is 23.68 kg/m.  General Appearance: Disheveled  Eye Contact:  Good  Speech:  Clear and Coherent  Volume:  Normal  Mood:  Anxious  Affect:  Congruent  Thought Process:  Coherent, Goal Directed and Linear  Orientation:  Full (Time, Place, and Person)  Thought Content:  Logical  Suicidal Thoughts:  No  Homicidal Thoughts:  No  Memory:  Immediate;   Good Recent;   Good Remote;   Fair  Judgement:  Good  Insight:  Good  Psychomotor Activity:  Normal  Concentration:  Concentration: Good and Attention Span: Good  Recall:  Good  Fund of Knowledge:  Good  Language:  Good  Akathisia:  No  Handed:  Right  AIMS (if indicated):     Assets:  Agricultural consultant Housing Social Support  ADL's:  Intact  Cognition:  WNL  Sleep:        Treatment Plan Summary: Daily contact with patient to assess and evaluate symptoms and progress in treatment and Medication management (see MAR)  Disposition: Recommend psychiatric Inpatient admission when medically cleared.  Ethelene Hal, NP 06/20/2017 1:33 PM  Patient seen face-to-face for psychiatric evaluation, chart reviewed and case discussed with the physician extender and developed treatment plan. Reviewed the information documented and agree with the treatment plan. Corena Pilgrim, MD

## 2017-06-20 NOTE — ED Notes (Signed)
Pt taking a shower 

## 2017-06-20 NOTE — Progress Notes (Addendum)
Per tx team, pt is being D/C'd, CSW also received signed 1st exam from pt's psychiatrist and placed in chart.   Psychiatrist signed papers rescinding pt's IVC paperwork, CSW faxed in to the magistrate and confirmed paperwork was received.  Please reconsult if future social work needs arise.  CSW signing off, as social work intervention is no longer needed.  Dorothe PeaJonathan F. Exavier Lina, LCSW, LCAS, CSI Clinical Social Worker Ph: [phone]

## 2017-06-20 NOTE — ED Notes (Signed)
Pt speech pressured , behavior bizarre, aggressive, reports his mother is trying to kidnap him, pt demanding to call 911 to speak to "real police officers." Pt tore sign off wall, pt tore e-signature pad and threw it in the floor, pt disrobing in his room, reports his clothes are cursed. Reports we all "work under the rock" [employer] and security present. Jacki ConesLaurie NP notified of pt behavior. Will continue to monitor.

## 2017-06-20 NOTE — ED Notes (Signed)
Pt showering for 4th time this shift without towel ,clean scrubs, soap. Pt goes to rest room, showers, dresses and returns to bed.

## 2017-06-20 NOTE — ED Notes (Signed)
SBAR Report received from previous nurse. Pt received calm and visible on unit. Pt denies current SI/ HI, A/V H, depression, anxiety, or pain at this time, and appears otherwise stable and free of distress. Pt reminded of camera surveillance, q 15 min rounds, and rules of the milieu. Will continue to assess. 

## 2017-06-20 NOTE — ED Notes (Signed)
Pt in shower for 3rd time this shift

## 2017-06-21 DIAGNOSIS — R4587 Impulsiveness: Secondary | ICD-10-CM

## 2017-06-21 DIAGNOSIS — R451 Restlessness and agitation: Secondary | ICD-10-CM

## 2017-06-21 MED ORDER — STERILE WATER FOR INJECTION IJ SOLN
INTRAMUSCULAR | Status: AC
  Administered 2017-06-21: 09:00:00
  Filled 2017-06-21: qty 10

## 2017-06-21 MED ORDER — HALOPERIDOL DECANOATE 100 MG/ML IM SOLN
100.0000 mg | INTRAMUSCULAR | Status: DC
Start: 2017-06-21 — End: 2017-06-22
  Administered 2017-06-21: 100 mg via INTRAMUSCULAR
  Filled 2017-06-21: qty 1

## 2017-06-21 MED ORDER — LORAZEPAM 2 MG/ML IJ SOLN
2.0000 mg | Freq: Once | INTRAMUSCULAR | Status: AC
  Administered 2017-06-21: 2 mg via INTRAMUSCULAR
  Filled 2017-06-21: qty 1

## 2017-06-21 MED ORDER — DIPHENHYDRAMINE HCL 50 MG/ML IJ SOLN
50.0000 mg | Freq: Once | INTRAMUSCULAR | Status: AC
  Administered 2017-06-21: 50 mg via INTRAMUSCULAR
  Filled 2017-06-21: qty 1

## 2017-06-21 MED ORDER — ZIPRASIDONE MESYLATE 20 MG IM SOLR
20.0000 mg | Freq: Once | INTRAMUSCULAR | Status: AC
  Administered 2017-06-21: 20 mg via INTRAMUSCULAR
  Filled 2017-06-21: qty 20

## 2017-06-21 NOTE — ED Notes (Signed)
Pt woke up and has been calm and cooperative. He took his Haldol-D injection in the right deltoid. No distress noted.

## 2017-06-21 NOTE — ED Notes (Signed)
Pt has written on the window with crayon; he has written on the trash bag with tooth paste. He hits alarms without any apparent need. He took off his clothing and ran in the milieu naked with staff went into the bathroom to address the alarm. He is not redirectable and is agitated.

## 2017-06-21 NOTE — Consult Note (Signed)
[facility]   Reason for Consult:  Psychosis Referring Physician:  EDP Patient Identification: Cody [name] MRN:  [id number] Principal Diagnosis: Schizoaffective disorder, bipolar type [facility](HCC) Diagnosis:   Patient Active Problem List   Diagnosis Date Noted  . Cannabis use disorder, severe, dependence (HCC) [F12.20] 12/11/2014  . Schizoaffective disorder, bipolar type (HCC) [F25.0] 11/05/2014    Total Time spent with patient: 45 minutes  HPI:  Patient is a 27 y.o. Male patient admitted with aggressive behaviors at home increasing over the last week. Patient mother who provides collateral information notes that Patient has been seen by a community ACT team that has been providing Patient with his every other week Haldol injections. Mother states that over the last little while he has only been getting 75 mg injections and not the 100 mg injections that he had previously been very well managed on. Mother is unsure of reason for the medication change. Mother of patient also notes that her other children are afraid of patient when he is manic and unstable like he is now but would like him to come home as soon as he is stable. Patient currently denies suicidal/homicidal ideation, denies auditory/visual hallucinations and does not appear to be responding to internal stimuli. Yesterday patient was cooperative on the unit until he found out that he was unable to come home and then became very loud and agitated hitting walls, the glass around the nurses station, and becoming aggressive towards staff. Patient also was flashing nursing with his genitals. Patient unclear about why he was exhibiting these behaviors other than wanting to come home. Patient continues to be frustrated that he is still here in the hospital and does not feel that he has any mental health concerns at this time.   Past Psychiatric History:  As noted above  Risk to Self: Suicidal Ideation: No(Pt denies.  ) Suicidal Intent: No Is patient at risk for suicide?: No Suicidal Plan?: No Access to Means: No What has been your use of drugs/alcohol within the last 12 months?: Marijuana and cigarettes.  How many times?: 0 Other Self Harm Risks: Pt denies.  Triggers for Past Attempts: None known Intentional Self Injurious Behavior: None(Pt denies. ) Risk to Others: Homicidal Ideation: Yes-Currently Present(Pt denies, however per IVC. ) Thoughts of Harm to Others: Yes-Currently Present Comment - Thoughts of Harm to Others: Per IVC, pt threatened to cut step dads neck.  Current Homicidal Intent: Yes-Currently Present Current Homicidal Plan: Yes-Currently Present Describe Current Homicidal Plan: Per IVC, pt threatened to cut step dads neck.  Access to Homicidal Means: No(Pt denies access to weapons. ) Describe Access to Homicidal Means: Pt denies access to weapons.  Identified Victim: Pt's step father.  History of harm to others?: Yes Assessment of Violence: On admission Violent Behavior Description: Per IVC, pt was aggressive with police officers, today.  Does patient have access to weapons?: (Pt denies access to weapons. ) Criminal Charges Pending?: No Does patient have a court date: No Prior Inpatient Therapy: Prior Inpatient Therapy: Yes(Per chart.) Prior Therapy Dates: (UTA) Prior Therapy Facilty/Provider(s): Per chart, [facility].  Reason for Treatment: Bipolar and Schizophrenia.  Prior Outpatient Therapy: Prior Outpatient Therapy: Yes Prior Therapy Dates: Current Prior Therapy Facilty/Provider(s): [facility].  Reason for Treatment: Per chart, medication mangement and counseling.  Does patient have an ACCT team?: No Does patient have Intensive In-House Services?  : No Does patient have [facility] services? : No Does patient have P4CC services?: No  Past Medical History:  Past Medical History:  Diagnosis Date  . Anxiety   . Mood disorder (HCC)   . Schizophrenia (HCC)    History  reviewed. No pertinent surgical history. Family History: History reviewed. No pertinent family history. Family Psychiatric  History: Unknown Social History:  Social History   Substance and Sexual Activity  Alcohol Use Yes     Social History   Substance and Sexual Activity  Drug Use Yes  . Types: Marijuana    Social History   Socioeconomic History  . Marital status: Single    Spouse name: None  . Number of children: None  . Years of education: None  . Highest education level: None  Social Needs  . Financial resource strain: None  . Food insecurity - worry: None  . Food insecurity - inability: None  . Transportation needs - medical: None  . Transportation needs - non-medical: None  Occupational History  . None  Tobacco Use  . Smoking status: Smoker, Current Status Unknown  . Smokeless tobacco: Never Used  Substance and Sexual Activity  . Alcohol use: Yes  . Drug use: Yes    Types: Marijuana  . Sexual activity: Yes  Other Topics Concern  . None  Social History Narrative   ** Merged History Encounter **         Allergies:   Allergies  Allergen Reactions  . Penicillins Hives and Other (See Comments)     Has patient had a PCN reaction causing immediate rash, facial/tongue/throat swelling, SOB or lightheadedness with hypotension: No Has patient had a PCN reaction causing severe rash involving mucus membranes or skin necrosis: No Has patient had a PCN reaction that required hospitalization No Has patient had a PCN reaction occurring within the last 10 years: no If all of the above answers are "NO", then may proceed with Cephalosporin use.    Labs: No results found for this or any previous visit (from the past 48 hour(s)).  Current Facility-Administered Medications  Medication Dose Route Frequency Provider Last Rate Last Dose  . acetaminophen (TYLENOL) tablet 650 mg  650 mg Oral Q4H PRN Street, [place], [place]   650 mg at 06/19/17 1930  . alum & mag  hydroxide-simeth (MAALOX/MYLANTA) 200-200-20 MG/5ML suspension 30 mL  30 mL Oral Q6H PRN Street, [place], [place]      . divalproex (DEPAKOTE) DR tablet 500 mg  500 mg Oral Q12H Thedore Mins, MD   Stopped at 06/21/17 9528  . hydrOXYzine (ATARAX/VISTARIL) tablet 25 mg  25 mg Oral BID Thedore Mins, MD   Stopped at 06/21/17 4132  . nicotine (NICODERM CQ - dosed in mg/24 hours) patch 21 mg  21 mg Transdermal Daily [address] [place], [place]   Stopped at 06/21/17 4401  . ondansetron (ZOFRAN) tablet 4 mg  4 mg Oral Q8H PRN Street, [place], [place]      . risperiDONE (RISPERDAL) tablet 1 mg  1 mg Oral BID Thedore Mins, MD   Stopped at 06/21/17 0272   Current Outpatient Medications  Medication Sig Dispense Refill  . divalproex (DEPAKOTE) 500 MG DR tablet Take 1 tablet (500 mg total) every 12 (twelve) hours by mouth. (Patient not taking: Reported on 06/18/2017) 60 tablet 0  . risperiDONE (RISPERDAL) 0.5 MG tablet Take 1 tablet (0.5 mg total) 2 (two) times daily by mouth. (Patient not taking: Reported on 06/18/2017) 60 tablet 0  . traZODone (DESYREL) 100 MG tablet Take 1 tablet (100 mg total) at bedtime as needed by mouth for sleep. (Patient not taking: Reported  on 06/18/2017) 30 tablet 0    Musculoskeletal: Strength & Muscle Tone: within normal limits Gait & Station: normal Patient leans: N/A  Psychiatric Specialty Exam: Physical Exam  Nursing note and vitals reviewed. Constitutional: He is oriented to person, place, and time. He appears well-developed and well-nourished.  HENT:  Head: Normocephalic and atraumatic.  Neck: Normal range of motion.  Respiratory: Effort normal.  Musculoskeletal: Normal range of motion.  Neurological: He is alert and oriented to person, place, and time. He has normal strength.  Psychiatric: His speech is normal. His affect is angry. He is agitated and aggressive. Thought content is paranoid. Cognition and memory are impaired. He expresses impulsivity.    Review of  Systems  Psychiatric/Behavioral: Positive for depression, hallucinations and substance abuse. Negative for memory loss and suicidal ideas. The patient is not nervous/anxious and does not have insomnia.   All other systems reviewed and are negative.   Blood pressure (!) 124/98, pulse 100, temperature 98.2 F (36.8 C), temperature source Oral, resp. rate 20, height 5\' 10"  (1.778 m), weight 74.8 kg (165 lb), SpO2 100 %.Body mass index is 23.68 kg/m.  General Appearance: Casual  Eye Contact:  Good  Speech:  Normal Rate  Volume:  Increased  Mood:  Angry and Irritable  Affect:  Inappropriate  Thought Process:  Goal Directed  Orientation:  Full (Time, Place, and Person)  Thought Content:  Paranoid Ideation  Suicidal Thoughts:  No  Homicidal Thoughts:  No  Memory:  Immediate;   Good Recent;   Fair Remote;   Fair  Judgement:  Impaired  Insight:  Lacking  Psychomotor Activity:  Normal  Concentration:  Concentration: Fair and Attention Span: Fair  Recall:  [employer] of Knowledge:  Good  Language:  Good  Akathisia:  No  Handed:  Right  AIMS (if indicated):   N/A  Assets:  Communication Skills Resilience Social Support  ADL's:  Intact  Cognition:  Impaired,  Moderate  Sleep:   N/A     Treatment Plan Summary: Daily contact with patient to assess and evaluate symptoms and progress in treatment and Medication management (see MAR)  Disposition: Recommend psychiatric Inpatient admission when medically cleared. TTS to seek placement  Laveda Abbe, NP 06/21/2017 12:37 PM   Patient seen face-to-face for psychiatric evaluation, chart reviewed and case discussed with the physician extender and developed treatment plan. Reviewed the information documented and agree with the treatment plan.  Juanetta Beets, DO

## 2017-06-22 NOTE — BHH Suicide Risk Assessment (Signed)
Suicide Risk Assessment  Discharge Assessment   [facility]   Principal Problem: Schizoaffective disorder, bipolar type [facility](HCC) Discharge Diagnoses:  Patient Active Problem List   Diagnosis Date Noted  . Cannabis use disorder, severe, dependence (HCC) [F12.20] 12/11/2014  . Schizoaffective disorder, bipolar type (HCC) [F25.0] 11/05/2014    Total Time spent with patient: 30 minutes  Musculoskeletal: Strength & Muscle Tone: within normal limits Gait & Station: normal Patient leans: N/A  Psychiatric Specialty Exam: Physical Exam  Nursing note and vitals reviewed. Constitutional: He is oriented to person, place, and time. He appears well-developed and well-nourished.  HENT:  Head: Normocephalic and atraumatic.  Neck: Normal range of motion.  Respiratory: Effort normal.  Musculoskeletal: Normal range of motion.  Neurological: He is alert and oriented to person, place, and time. He has normal strength.  Psychiatric: His speech is normal and behavior is normal. Thought content normal. His mood appears anxious. He is not agitated and not aggressive. Thought content is not paranoid. Cognition and memory are normal. Cognition and memory are not impaired. He expresses impulsivity.   Review of Systems  Psychiatric/Behavioral: Negative for depression, hallucinations, memory loss, substance abuse and suicidal ideas. The patient is nervous/anxious. The patient does not have insomnia.   All other systems reviewed and are negative.  Blood pressure 119/74, pulse 79, temperature 98.2 F (36.8 C), resp. rate 20, height 5\' 10"  (1.778 m), weight 74.8 kg (165 lb), SpO2 100 %.Body mass index is 23.68 kg/m. General Appearance: Casual Eye Contact:  Good Speech:  Normal Rate Volume:  Increased Mood:  Euthymic Affect:  Congruent Thought Process:  Goal Directed Orientation:  Full (Time, Place, and Person) Thought Content:  Logical Suicidal Thoughts:  No Homicidal Thoughts:   No Memory:  Immediate;   Good Recent;   Fair Remote;   Fair Judgement:  Impaired Insight:  Lacking Psychomotor Activity:  Normal Concentration:  Concentration: Fair and Attention Span: Fair Recall:  [employer] of Knowledge:  Good Language:  Good Akathisia:  No Handed:  Right AIMS (if indicated):   N/A Assets:  Communication Skills Resilience Social Support ADL's:  Intact Cognition:  WNL   Mental Status Per Nursing Assessment::   On Admission:   Psychosis  Demographic Factors:  Male, Adolescent or young adult, Low socioeconomic status and Unemployed  Loss Factors: Financial problems/change in socioeconomic status  Historical Factors: Impulsivity  Risk Reduction Factors:   Sense of responsibility to family and Living with another person, especially a relative  Continued Clinical Symptoms:  Severe Anxiety and/or Agitation Depression:   Impulsivity Alcohol/Substance Abuse/Dependencies More than one psychiatric diagnosis  Cognitive Features That Contribute To Risk:  Closed-mindedness    Suicide Risk:  Minimal: No identifiable suicidal ideation.  Patients presenting with no risk factors but with morbid ruminations; may be classified as minimal risk based on the severity of the depressive symptoms    Plan Of Care/Follow-up recommendations:  Activity:  as tolerated Diet:  Heart Healthy  Laveda AbbeLaurie Britton Karynn Deblasi, NP 06/22/2017, 11:33 AM

## 2017-06-22 NOTE — BH Assessment (Signed)
[facility]  Clinician called pt's mother, Helaine Chessracy Holt [phone]) to advise that pt has been recommended for d/c. Mom provided no concerns or questions. She indicated that she's at work, currently, but will contact pt's ACTT team to see if they are able to pick him up.   Johny ShockSamantha M. Ladona Ridgelaylor, MS, NCC, LPCA Counselor

## 2017-06-22 NOTE — Consult Note (Signed)
[facility]   Reason for Consult:  Psychosis Referring Physician:  EDP Patient Identification: Ninfa Lindennthony L Chancellor MRN:  [id number] Principal Diagnosis: Schizoaffective disorder, bipolar type [facility](HCC) Diagnosis:   Patient Active Problem List   Diagnosis Date Noted  . Cannabis use disorder, severe, dependence (HCC) [F12.20] 12/11/2014  . Schizoaffective disorder, bipolar type (HCC) [F25.0] 11/05/2014    Total Time spent with patient: 30 minutes  HPI:  Patient is a 27 y.o. male patient admitted with aggressive behaviors at home increasing over the last week. Patient mother who provides collateral information notes that Patient has been seen by a community ACT team that has been providing Patient with his every other week Haldol injections. Mother states that over the last little while he has only been getting 75 mg injections and not the 100 mg injections that he had previously been very well managed on. Mother is unsure of reason for the medication change. Mother of patient also notes that her other children are afraid of patient when he is manic and unstable like he is now but would like him to come home as soon as he is stable. Patient currently denies suicidal/homicidal ideation, denies auditory/visual hallucinations and does not appear to be responding to internal stimuli.   Pt received his Haldol 100 mg injection yesterday, slept well last night and has had no further outbursts on the unit. Pt has been medication compliant while in the [facility]. Pt is stable and psychiatrically clear for discharge.   Past Psychiatric History:  As noted above  Risk to Self: None Risk to Others: None Prior Inpatient Therapy: Prior Inpatient Therapy: Yes(Per chart.) Prior Therapy Dates: (UTA) Prior Therapy Facilty/Provider(s): Per chart, [facility].  Reason for Treatment: Bipolar and Schizophrenia.  Prior Outpatient Therapy: Prior Outpatient Therapy: Yes Prior Therapy Dates: Current Prior  Therapy Facilty/Provider(s): [facility].  Reason for Treatment: Per chart, medication mangement and counseling.  Does patient have an ACCT team?: No Does patient have Intensive In-House Services?  : No Does patient have [facility] services? : No Does patient have P4CC services?: No  Past Medical History:  Past Medical History:  Diagnosis Date  . Anxiety   . Mood disorder (HCC)   . Schizophrenia (HCC)    History reviewed. No pertinent surgical history. Family History: History reviewed. No pertinent family history. Family Psychiatric  History: Unknown Social History:  Social History   Substance and Sexual Activity  Alcohol Use Yes     Social History   Substance and Sexual Activity  Drug Use Yes  . Types: Marijuana    Social History   Socioeconomic History  . Marital status: Single    Spouse name: None  . Number of children: None  . Years of education: None  . Highest education level: None  Social Needs  . Financial resource strain: None  . Food insecurity - worry: None  . Food insecurity - inability: None  . Transportation needs - medical: None  . Transportation needs - non-medical: None  Occupational History  . None  Tobacco Use  . Smoking status: Smoker, Current Status Unknown  . Smokeless tobacco: Never Used  Substance and Sexual Activity  . Alcohol use: Yes  . Drug use: Yes    Types: Marijuana  . Sexual activity: Yes  Other Topics Concern  . None  Social History Narrative   ** Merged History Encounter **         Allergies:   Allergies  Allergen Reactions  . Penicillins Hives and  Other (See Comments)     Has patient had a PCN reaction causing immediate rash, facial/tongue/throat swelling, SOB or lightheadedness with hypotension: No Has patient had a PCN reaction causing severe rash involving mucus membranes or skin necrosis: No Has patient had a PCN reaction that required hospitalization No Has patient had a PCN reaction occurring within the  last 10 years: no If all of the above answers are "NO", then may proceed with Cephalosporin use.    Labs: No results found for this or any previous visit (from the past 48 hour(s)).  Current Facility-Administered Medications  Medication Dose Route Frequency Provider Last Rate Last Dose  . acetaminophen (TYLENOL) tablet 650 mg  650 mg Oral Q4H PRN Street, [place], [place]   650 mg at 06/19/17 1930  . alum & mag hydroxide-simeth (MAALOX/MYLANTA) 200-200-20 MG/5ML suspension 30 mL  30 mL Oral Q6H PRN Street, [place], [place]      . divalproex (DEPAKOTE) DR tablet 500 mg  500 mg Oral Q12H Akintayo, Mojeed, MD   500 mg at 06/22/17 0911  . haloperidol decanoate (HALDOL DECANOATE) 100 MG/ML injection 100 mg  100 mg Intramuscular Q30 days Laveda Abbe, NP   100 mg at 06/21/17 1445  . hydrOXYzine (ATARAX/VISTARIL) tablet 25 mg  25 mg Oral BID Akintayo, Mojeed, MD   25 mg at 06/22/17 0911  . nicotine (NICODERM CQ - dosed in mg/24 hours) patch 21 mg  21 mg Transdermal Daily [address] [place], [place]   21 mg at 06/22/17 0919  . ondansetron (ZOFRAN) tablet 4 mg  4 mg Oral Q8H PRN Street, [place], [place]      . risperiDONE (RISPERDAL) tablet 1 mg  1 mg Oral BID Thedore Mins, MD   1 mg at 06/22/17 0911   Current Outpatient Medications  Medication Sig Dispense Refill  . divalproex (DEPAKOTE) 500 MG DR tablet Take 1 tablet (500 mg total) every 12 (twelve) hours by mouth. (Patient not taking: Reported on 06/18/2017) 60 tablet 0  . risperiDONE (RISPERDAL) 0.5 MG tablet Take 1 tablet (0.5 mg total) 2 (two) times daily by mouth. (Patient not taking: Reported on 06/18/2017) 60 tablet 0  . traZODone (DESYREL) 100 MG tablet Take 1 tablet (100 mg total) at bedtime as needed by mouth for sleep. (Patient not taking: Reported on 06/18/2017) 30 tablet 0    Musculoskeletal: Strength & Muscle Tone: within normal limits Gait & Station: normal Patient leans: N/A  Psychiatric Specialty Exam: Physical Exam  Nursing note  and vitals reviewed. Constitutional: He is oriented to person, place, and time. He appears well-developed and well-nourished.  HENT:  Head: Normocephalic and atraumatic.  Neck: Normal range of motion.  Respiratory: Effort normal.  Musculoskeletal: Normal range of motion.  Neurological: He is alert and oriented to person, place, and time. He has normal strength.  Psychiatric: His speech is normal and behavior is normal. Thought content normal. His mood appears anxious. He is not agitated and not aggressive. Thought content is not paranoid. Cognition and memory are normal. Cognition and memory are not impaired. He expresses impulsivity.    Review of Systems  Psychiatric/Behavioral: Negative for depression, hallucinations, memory loss, substance abuse and suicidal ideas. The patient is nervous/anxious. The patient does not have insomnia.   All other systems reviewed and are negative.   Blood pressure 119/74, pulse 79, temperature 98.2 F (36.8 C), resp. rate 20, height 5\' 10"  (1.778 m), weight 74.8 kg (165 lb), SpO2 100 %.Body mass index is 23.68 kg/m.  General Appearance: Casual  Eye Contact:  Good  Speech:  Normal Rate  Volume:  Increased  Mood:  Euthymic  Affect:  Congruent  Thought Process:  Goal Directed  Orientation:  Full (Time, Place, and Person)  Thought Content:  Logical  Suicidal Thoughts:  No  Homicidal Thoughts:  No  Memory:  Immediate;   Good Recent;   Fair Remote;   Fair  Judgement:  Impaired  Insight:  Lacking  Psychomotor Activity:  Normal  Concentration:  Concentration: Fair and Attention Span: Fair  Recall:  [employer] of Knowledge:  Good  Language:  Good  Akathisia:  No  Handed:  Right  AIMS (if indicated):   N/A  Assets:  Communication Skills Resilience Social Support  ADL's:  Intact  Cognition:  WNL  Sleep:   N/A     Treatment Plan Summary: Plan Schizoaffective disorder  Discharge Home Follow up with [facility] ACT team. Take all  medications as prescribed Avoid the use of alcohol and illicit drugs   Disposition: No evidence of imminent risk to self or others at present.   Patient does not meet criteria for psychiatric inpatient admission. Supportive therapy provided about ongoing stressors. Discussed crisis plan, support from social network, calling 911, coming to the Emergency Department, and calling Suicide Hotline.   Laveda Abbe, NP 06/22/2017 11:21 AM   Patient seen face-to-face for psychiatric evaluation, chart reviewed and case discussed with the physician extender and developed treatment plan. Reviewed the information documented and agree with the treatment plan.  Juanetta Beets, DO

## 2017-06-22 NOTE — ED Notes (Signed)
Patient constantly pacing, frequenting the bathroom and pressing the emergency bell. Verbally redirected and needs constant redirection to stay on task. Will continue to monitor.

## 2017-06-22 NOTE — BH Assessment (Signed)
[facility]  Per Juanetta BeetsJacqueline Norman, DO, this pt does not require psychiatric hospitalization at this time.  Pt presents under IVC initiated by pt's case manager, and upheld by Thedore MinsMojeed Akintayo, MD, which has now been rescinded by Dr Sharma CovertNorman.  [facility]  Pt's most recent provider is the [facility] Team, but pt's mother has reportedly made arrangements for pt's care to be transferred to a unspecified provider.   Discharge instructions advise pt to continue treatment with the [facility] ACT Team until an alternative is in place.  Pt's nurse, Morrie Sheldonshley, has been notified.  Doylene Canninghomas Nitin Mckowen, MA Triage Specialist [phone]

## 2017-06-22 NOTE — Discharge Instructions (Signed)
For your behavioral health needs, you are advised to continue treatment with the [facility] Team, unless arrangements are made for you to see a different provider:       [facility]      [address]      [place] [id number]      [phone]

## 2017-06-22 NOTE — ED Notes (Signed)
Pt taking a shower 

## 2017-06-22 NOTE — ED Notes (Signed)
Pt d/c home per MD order. Discharge summary reviewed with pt. Pt verbalizes understanding. Pt denies SI/HI/AVH. Pt signed for personal property and property returned. Pt signed e-signature. Ambulatory off unit with [facility]. Pt mother in lobby.

## 2019-08-27 ENCOUNTER — Encounter (HOSPITAL_COMMUNITY): Payer: Self-pay | Admitting: Emergency Medicine

## 2019-08-27 ENCOUNTER — Other Ambulatory Visit: Payer: Self-pay

## 2019-08-27 ENCOUNTER — Emergency Department (HOSPITAL_COMMUNITY)
Admission: EM | Admit: 2019-08-27 | Discharge: 2019-08-27 | Discharge status: Against Medical Advice | Payer: Medicaid Other | Attending: Emergency Medicine | Admitting: Emergency Medicine

## 2019-08-27 DIAGNOSIS — R440 Auditory hallucinations: Secondary | ICD-10-CM

## 2019-08-27 DIAGNOSIS — F209 Schizophrenia, unspecified: Secondary | ICD-10-CM | POA: Insufficient documentation

## 2019-08-27 LAB — CBC WITH DIFFERENTIAL/PLATELET
Abs Immature Granulocytes: 0.09 10*3/uL — ABNORMAL HIGH (ref 0.00–0.07)
Basophils Absolute: 0 10*3/uL (ref 0.0–0.1)
Basophils Relative: 0 %
Eosinophils Absolute: 0 10*3/uL (ref 0.0–0.5)
Eosinophils Relative: 0 %
HCT: 43.6 % (ref 39.0–52.0)
Hemoglobin: 13.8 g/dL (ref 13.0–17.0)
Immature Granulocytes: 1 %
Lymphocytes Relative: 12 %
Lymphs Abs: 1.4 10*3/uL (ref 0.7–4.0)
MCH: 28.5 pg (ref 26.0–34.0)
MCHC: 31.7 g/dL (ref 30.0–36.0)
MCV: 90.1 fL (ref 80.0–100.0)
Monocytes Absolute: 0.6 10*3/uL (ref 0.1–1.0)
Monocytes Relative: 5 %
Neutro Abs: 9.9 10*3/uL — ABNORMAL HIGH (ref 1.7–7.7)
Neutrophils Relative %: 82 %
Platelets: 246 10*3/uL (ref 150–400)
RBC: 4.84 MIL/uL (ref 4.22–5.81)
RDW: 12.5 % (ref 11.5–15.5)
WBC: 12 10*3/uL — ABNORMAL HIGH (ref 4.0–10.5)
nRBC: 0 % (ref 0.0–0.2)

## 2019-08-27 LAB — RAPID URINE DRUG SCREEN, HOSP PERFORMED
Amphetamines: NOT DETECTED
Barbiturates: NOT DETECTED
Benzodiazepines: NOT DETECTED
Cocaine: NOT DETECTED
Opiates: NOT DETECTED
Tetrahydrocannabinol: NOT DETECTED

## 2019-08-27 LAB — COMPREHENSIVE METABOLIC PANEL
ALT: 25 U/L (ref 0–44)
AST: 30 U/L (ref 15–41)
Albumin: 4.7 g/dL (ref 3.5–5.0)
Alkaline Phosphatase: 69 U/L (ref 38–126)
Anion gap: 10 (ref 5–15)
BUN: 6 mg/dL (ref 6–20)
CO2: 26 mmol/L (ref 22–32)
Calcium: 9.6 mg/dL (ref 8.9–10.3)
Chloride: 99 mmol/L (ref 98–111)
Creatinine, Ser: 0.77 mg/dL (ref 0.61–1.24)
GFR calc Af Amer: >60 mL/min (ref >60)
GFR calc non Af Amer: >60 mL/min (ref >60)
Glucose, Bld: 108 mg/dL — ABNORMAL HIGH (ref 70–99)
Potassium: 3.8 mmol/L (ref 3.5–5.1)
Sodium: 135 mmol/L (ref 135–145)
Total Bilirubin: 1.3 mg/dL — ABNORMAL HIGH (ref 0.3–1.2)
Total Protein: 7.9 g/dL (ref 6.5–8.1)

## 2019-08-27 LAB — VALPROIC ACID LEVEL: Valproic Acid Lvl: <10 ug/mL — ABNORMAL LOW (ref 50.0–100.0)

## 2019-08-27 LAB — ETHANOL: Alcohol, Ethyl (B): <10 mg/dL (ref <10)

## 2019-08-27 NOTE — ED Triage Notes (Signed)
Per [employer]-states he was seen at [facility] for hallucinations-UA positive for [facility] and meth-stated they were at capacity so [employer] transported him here

## 2019-08-27 NOTE — BH Assessment (Signed)
   Counselor informed by Nurse Kennyth Arnold that the Patient walked out.

## 2019-08-27 NOTE — ED Provider Notes (Signed)
[name]-EMERGENCY DEPT Provider Note   CSN: [id number] Arrival date & time: 08/27/19  1256     History Chief Complaint  Patient presents with  . Psychiatric Evaluation    [name] is a 29 y.o. male.  HPI   Patient presents to the ED for evaluation of auditory hallucinations.  Patient has a history of mental health problems.  He has history of schizophrenia and bipolar disorder.  Patient states he has been having increasing for hallucinations.  The voices are telling him to do a number of things.  Denies that there is specifically telling him to hurt himself else.  He states he has been taking his medications.  He denied that he has been using any drugs.  Police states that the patient was at [facility] mental health facility for his hallucinations.  They were at capacity so they asked the police to bring him to the ED here for further evaluation.  According to the police officer that the patient did test positive for marijuana and methamphetamine.  Past Medical History:  Diagnosis Date  . Anxiety   . Mood disorder (HCC)   . Schizophrenia [facility])     Patient Active Problem List   Diagnosis Date Noted  . Cannabis use disorder, severe, dependence (HCC) 12/11/2014  . Schizoaffective disorder, bipolar type (HCC) 11/05/2014    History reviewed. No pertinent surgical history.     No family history on file.  Social History   Tobacco Use  . Smoking status: Smoker, Current Status Unknown  . Smokeless tobacco: Never Used  Substance Use Topics  . Alcohol use: Yes  . Drug use: Yes    Types: Marijuana, Methamphetamines    Home Medications Prior to Admission medications   Medication Sig Start Date End Date Taking? Authorizing Provider  divalproex (DEPAKOTE) 500 MG DR tablet Take 1 tablet (500 mg total) every 12 (twelve) hours by mouth. Patient not taking: Reported on 06/18/2017 05/02/17   Charm Rings, NP  risperiDONE (RISPERDAL) 0.5 MG tablet Take 1  tablet (0.5 mg total) 2 (two) times daily by mouth. Patient not taking: Reported on 06/18/2017 05/02/17   Charm Rings, NP  traZODone (DESYREL) 100 MG tablet Take 1 tablet (100 mg total) at bedtime as needed by mouth for sleep. Patient not taking: Reported on 06/18/2017 05/02/17   Charm Rings, NP  benztropine (COGENTIN) 0.5 MG tablet Take one tab (0.5 mg) in the morning and one in the evening. Patient taking differently: Take 0.5 mg by mouth 2 (two) times daily.  11/10/14 09/23/15  Adonis Brook, NP    Allergies    Penicillins  Review of Systems   Review of Systems  All other systems reviewed and are negative.   Physical Exam Updated Vital Signs BP (!) 148/88 (BP Location: Right Arm)   Pulse 78   Temp 98.2 F (36.8 C)   Resp 17   SpO2 100%   Physical Exam Vitals and nursing note reviewed.  Constitutional:      General: He is not in acute distress.    Appearance: He is well-developed.  HENT:     Head: Normocephalic and atraumatic.     Right Ear: External ear normal.     Left Ear: External ear normal.  Eyes:     General: No scleral icterus.       Right eye: No discharge.        Left eye: No discharge.     Conjunctiva/sclera: Conjunctivae normal.  Neck:     Trachea: No tracheal deviation.  Cardiovascular:     Rate and Rhythm: Normal rate and regular rhythm.  Pulmonary:     Effort: Pulmonary effort is normal. No respiratory distress.     Breath sounds: Normal breath sounds. No stridor. No wheezing or rales.  Abdominal:     General: Bowel sounds are normal. There is no distension.     Palpations: Abdomen is soft.     Tenderness: There is no abdominal tenderness. There is no guarding or rebound.  Musculoskeletal:        General: No tenderness.     Cervical back: Neck supple.  Skin:    General: Skin is warm and dry.     Findings: No rash.  Neurological:     Mental Status: He is alert.     Cranial Nerves: No cranial nerve deficit (no facial droop, extraocular  movements intact, no slurred speech).     Sensory: No sensory deficit.     Motor: No abnormal muscle tone or seizure activity.     Coordination: Coordination normal.  Psychiatric:        Attention and Perception: He is attentive. He perceives auditory hallucinations.        Mood and Affect: Mood normal. Mood is not elated. Affect is not blunt or angry.        Speech: Speech is not delayed or tangential.        Behavior: Behavior is not aggressive or hyperactive. Behavior is cooperative.        Thought Content: Thought content does not include homicidal or suicidal ideation.     ED Results / Procedures / Treatments   Labs (all labs ordered are listed, but only abnormal results are displayed) Labs Reviewed  COMPREHENSIVE METABOLIC PANEL - Abnormal; Notable for the following components:      Result Value   Glucose, Bld 108 (*)    Total Bilirubin 1.3 (*)    All other components within normal limits  CBC WITH DIFFERENTIAL/PLATELET - Abnormal; Notable for the following components:   WBC 12.0 (*)    Neutro Abs 9.9 (*)    Abs Immature Granulocytes 0.09 (*)    All other components within normal limits  VALPROIC ACID LEVEL - Abnormal; Notable for the following components:   Valproic Acid Lvl <10 (*)    All other components within normal limits  RESPIRATORY PANEL BY RT PCR (FLU A&B, COVID)  ETHANOL  RAPID URINE DRUG SCREEN, HOSP PERFORMED    EKG None  Radiology No results found.  Procedures Procedures (including critical care time)  Medications Ordered in ED Medications - No data to display  ED Course  I have reviewed the triage vital signs and the nursing notes.  Pertinent labs & imaging results that were available during my care of the patient were reviewed by me and considered in my medical decision making (see chart for details).  The patient has been placed in psychiatric observation due to the need to provide a safe environment for the patient while obtaining  psychiatric consultation and evaluation, as well as ongoing medical and medication management to treat the patient's condition.  The patient has not been placed under full IVC at this time.     MDM Rules/Calculators/A&P                      Pt presented with hallucinations.  No SI or HI.  Hallucinations not telling him to hurt himself.  Pt ended up leaving the ED before seeing TTS.  Pt called his mother and told ED secretary that he had not taken his medications because he was staying with a friend.  Do not feel IVC is indicated.  It appears pt will be with his mother who will ensure he takes his medications.   Final Clinical Impression(s) / ED Diagnoses Final diagnoses:  Auditory hallucination    Rx / DC Orders ED Discharge Orders    None       Linwood Dibbles, MD 08/27/19 1652

## 2019-08-27 NOTE — ED Notes (Signed)
PT LEFT AMA 

## 2019-08-27 NOTE — ED Notes (Signed)
Per pt, states he was ready to go home-denied SI and HI-will follow up with [facility] per patient

## 2020-09-09 ENCOUNTER — Encounter (HOSPITAL_COMMUNITY): Payer: Self-pay | Admitting: Physician Assistant

## 2020-09-09 ENCOUNTER — Inpatient Hospital Stay (HOSPITAL_COMMUNITY)
Admission: RE | Admit: 2020-09-09 | Discharge: 2020-09-11 | DRG: 885 | Disposition: A | Discharge status: Home / Self Care | Payer: Medicaid Other | Attending: Psychiatry | Admitting: Psychiatry

## 2020-09-09 DIAGNOSIS — Z20822 Contact with and (suspected) exposure to covid-19: Secondary | ICD-10-CM | POA: Diagnosis present

## 2020-09-09 DIAGNOSIS — F1220 Cannabis dependence, uncomplicated: Secondary | ICD-10-CM | POA: Diagnosis present

## 2020-09-09 DIAGNOSIS — F333 Major depressive disorder, recurrent, severe with psychotic symptoms: Secondary | ICD-10-CM | POA: Insufficient documentation

## 2020-09-09 DIAGNOSIS — Z880 Allergy status to penicillin: Secondary | ICD-10-CM

## 2020-09-09 DIAGNOSIS — F259 Schizoaffective disorder, unspecified: Secondary | ICD-10-CM | POA: Diagnosis present

## 2020-09-09 DIAGNOSIS — F1290 Cannabis use, unspecified, uncomplicated: Secondary | ICD-10-CM

## 2020-09-09 DIAGNOSIS — R45851 Suicidal ideations: Secondary | ICD-10-CM | POA: Diagnosis present

## 2020-09-09 DIAGNOSIS — F250 Schizoaffective disorder, bipolar type: Secondary | ICD-10-CM | POA: Diagnosis present

## 2020-09-09 DIAGNOSIS — F1221 Cannabis dependence, in remission: Secondary | ICD-10-CM

## 2020-09-09 LAB — RESP PANEL BY RT-PCR (FLU A&B, COVID) ARPGX2
Influenza A by PCR: NEGATIVE
Influenza B by PCR: NEGATIVE
SARS Coronavirus 2 by RT PCR: NEGATIVE

## 2020-09-09 MED ORDER — ALUM & MAG HYDROXIDE-SIMETH 200-200-20 MG/5ML PO SUSP
30.0000 mL | ORAL | Status: DC | PRN
  Filled 2020-09-09: qty 30

## 2020-09-09 MED ORDER — ACETAMINOPHEN 325 MG PO TABS
650.0000 mg | ORAL_TABLET | Freq: Four times a day (QID) | ORAL | Status: DC | PRN
  Administered 2020-09-10 – 2020-09-11 (×2): 650 mg via ORAL
  Filled 2020-09-09 (×3): qty 2

## 2020-09-09 MED ORDER — TRAZODONE HCL 50 MG PO TABS
50.0000 mg | ORAL_TABLET | Freq: Every evening | ORAL | Status: DC | PRN
  Administered 2020-09-09: 50 mg via ORAL
  Filled 2020-09-09 (×2): qty 1

## 2020-09-09 MED ORDER — HYDROXYZINE HCL 25 MG PO TABS
25.0000 mg | ORAL_TABLET | Freq: Three times a day (TID) | ORAL | Status: DC | PRN
  Administered 2020-09-09 – 2020-09-10 (×2): 25 mg via ORAL
  Filled 2020-09-09 (×3): qty 1

## 2020-09-09 MED ORDER — MAGNESIUM HYDROXIDE 400 MG/5ML PO SUSP
30.0000 mL | Freq: Every day | ORAL | Status: DC | PRN
  Filled 2020-09-09: qty 30

## 2020-09-09 NOTE — BH Assessment (Signed)
Comprehensive Clinical Assessment (CCA) Note  09/09/2020 [name] [id number] Disposition: Nwoho PA recommends a inpatient admission to assist with stabilization. Patient has been accepted to [facility].    Flowsheet Row OP Visit from [facility]/09/2020 [facility]  C-SSRS RISK CATEGORY No Risk     Chief Complaint:  Chief Complaint  Patient presents with  . Psychiatric Evaluation  The patient demonstrates the following risk factors for suicide: Chronic risk factors for suicide include: N/A. Acute risk factors for suicide include: N/A. Protective factors for this patient include: coping skills. Considering these factors, the overall suicide risk at this point appears to be low.Patient is appropriate for outpatient follow up.  [name] is an 30 y.o. male who presents voluntary to [facility] with mother [name] [phone] experiencing ongoing AH. Patient reports passive S/I earlier this date although states he "would never hurt himself" and denies any S/I, H/I or VH at the time of assessment. Patient has been diagnosed with Schizoaffective D/O in the past and has been seen multiple times for the same. Per notes patient was last assessed on 06/28/17 when he presented at that time with similar symptoms although was with IVC at that time. Patient reports using Cannabis once or twice a week stating he "smokes a blunt on those days" with last use over the weekend. Patient is vague in reference to time frame and amounts used. Patient denies the use of any other substances.        Patient states he currently receives services from a ACT team ([facility]) where he has been diagnosed with Bipolar and Schizophrenia and has in the past been prescribed Depakote and Haldol although patient is uncertain what "medications they give him." Patient states he "took a shot two weeks ago" although they have not brought him any oral medications in the last week. Patient states over  the last three days he has been experiencing ongoing AH as noted above. Again patient is vague in reference to content stating they tell him "this and that." Patient denies any history of abuse or access to firearms. Patient states he currently resides alone and is unemployed although receives disability.    Patient presents alert and oriented. Patient is pleasant with coherent speech. Patient's eye contact was good. Patient's affect was congruent with mood. Patient's thought process was circumstantial. Patient's judgement was impaired. Patient's insight and impulse control are fair. Patient did not appear to be responding to internal stimuli.    Visit Diagnosis: Schizoaffective Disorder    CCA Screening, Triage and Referral (STR)  Patient Reported Information How did you hear about [place]? Self  Referral name: No data recorded Referral phone number: No data recorded  Whom do you see for routine medical problems? I don't have a doctor  Practice/Facility Name: No data recorded Practice/Facility Phone Number: No data recorded Name of Contact: No data recorded Contact Number: No data recorded Contact Fax Number: No data recorded Prescriber Name: No data recorded Prescriber Address (if known): No data recorded  What Is the Reason for Your Visit/Call Today? Ongoing AH  How Long Has This Been Causing You Problems? 1 wk - 1 month  What Do You Feel Would Help You the Most Today? Medication(s)   Have You Recently Been in Any Inpatient Treatment (Hospital/Detox/Crisis Center/28-Day Program)? No  Name/Location of Program/Hospital:No data recorded How Long Were You There? No data recorded When Were You Discharged? No data recorded  Have You Ever Received Services From  [place] Before? Yes  [facility]? Pt has been assessed by TTS on multiple occasions   Have You Recently Had Any Thoughts About Hurting Yourself? Yes  Are You Planning to Commit Suicide/Harm  Yourself At This time? No   Have you Recently Had Thoughts About Hurting Someone Karolee Ohslse? No  Explanation: No data recorded  Have You Used Any Alcohol or Drugs in the Past 24 Hours? No  How Long Ago Did You Use Drugs or Alcohol? No data recorded What Did You Use and How Much? No data recorded  Do You Currently Have a Therapist/Psychiatrist? Yes  Name of Therapist/Psychiatrist: [facility]   Have You Been Recently Discharged From Any Office Practice or Programs? No  Explanation of Discharge From Practice/Program: No data recorded    CCA Screening Triage Referral Assessment Type of Contact: Face-to-Face  Is this Initial or Reassessment? No data recorded Date Telepsych consult ordered in [facility]:  No data recorded Time Telepsych consult ordered in [facility]:  No data recorded  Patient Reported Information Reviewed? Yes  Patient Left Without Being Seen? No data recorded Reason for Not Completing Assessment: No data recorded  Collateral Involvement: No data recorded  Does Patient Have a Court Appointed Legal Guardian? No data recorded Name and Contact of Legal Guardian: No data recorded If Minor and Not Living with Parent(s), Who has Custody? No data recorded Is CPS involved or ever been involved? Never  Is APS involved or ever been involved? Never   Patient Determined To Be At Risk for Harm To Self or Others Based on Review of Patient Reported Information or Presenting Complaint? No  Method: No data recorded Availability of Means: No data recorded Intent: No data recorded Notification Required: No data recorded Additional Information for Danger to Others Potential: No data recorded Additional Comments for Danger to Others Potential: No data recorded Are There Guns or Other Weapons in Your Home? No data recorded Types of Guns/Weapons: No data recorded Are These Weapons Safely Secured?                            No data recorded Who Could Verify You Are Able To Have These  Secured: No data recorded Do You Have any Outstanding Charges, Pending Court Dates, Parole/Probation? No data recorded Contacted To Inform of Risk of Harm To Self or Others: Other: Comment (NA)   Location of Assessment: No data recorded  Does Patient Present under Involuntary Commitment? No  IVC Papers Initial File Date: No data recorded  [place]   Patient Currently Receiving the Following Services: ACTT Psychologist, educational([facility])   Determination of Need: Routine (7 days)   Options For Referral: Medication Management     CCA Biopsychosocial Intake/Chief Complaint:  Pt presents with ongoing AH  Current Symptoms/Problems: Pt states he is hearing voices that "tell him to do this and that."   Patient Reported Schizophrenia/Schizoaffective Diagnosis in Past: Yes   Strengths: Pt is willing to participate in treatment  Preferences: Medication managment  Abilities: Pt is open to interventions   Type of Services Patient Feels are Needed: Pt states he feels he needs to be admitted   Initial Clinical Notes/Concerns: Noe noted   Mental Health Symptoms Depression:  Difficulty Concentrating; Change in energy/activity   Duration of Depressive symptoms: Less than two weeks   Mania:  None   Anxiety:   Difficulty concentrating   Psychosis:  Hallucinations  Duration of Psychotic symptoms: Less than six months   Trauma:  None   Obsessions:  None   Compulsions:  None   Inattention:  None   Hyperactivity/Impulsivity:  N/A   Oppositional/Defiant Behaviors:  None   Emotional Irregularity:  None   Other Mood/Personality Symptoms:  as noted above    Mental Status Exam Appearance and self-care  Stature:  Average   Weight:  Average weight   Clothing:  Disheveled   Grooming:  Neglected   Cosmetic use:  None   Posture/gait:  Normal   Motor activity:  Restless   Sensorium  Attention:  Distractible   Concentration:  Anxiety  interferes   Orientation:  X5   Recall/memory:  Defective in Immediate   Affect and Mood  Affect:  Anxious   Mood:  Anxious   Relating  Eye contact:  Normal   Facial expression:  Anxious   Attitude toward examiner:  Cooperative   Thought and Language  Speech flow: Clear and Coherent   Thought content:  Appropriate to Mood and Circumstances   Preoccupation:  None   Hallucinations:  Auditory   Organization:  No data recorded  [employer] of Knowledge:  Fair   Intelligence:  Average   Abstraction:  Concrete   Judgement:  Fair   Reality Testing:  Realistic   Insight:  Fair   Decision Making:  Only simple   Social Functioning  Social Maturity:  Responsible   Social Judgement:  Normal   Stress  Stressors:  Family conflict   Coping Ability:  Exhausted   Skill Deficits:  Activities of daily living   Supports:  Usual     Religion: Religion/Spirituality Are You A Religious Person?: No  Leisure/Recreation: Leisure / Recreation Do You Have Hobbies?: No  Exercise/Diet: Exercise/Diet Do You Exercise?: No Have You Gained or Lost A Significant Amount of Weight in the Past Six Months?: No Do You Follow a Special Diet?: No Do You Have Any Trouble Sleeping?: No   CCA Employment/Education Employment/Work Situation: Employment / Work Psychologist, occupational Employment situation: On disability Has patient ever been in the [name]?: No  Education:     CCA Family/Childhood History Family and Relationship History: Family history Marital status: Single  Childhood History:  Childhood History Did patient suffer any verbal/emotional/physical/sexual abuse as a child?: No Did patient suffer from severe childhood neglect?: No Has patient ever been sexually abused/assaulted/raped as an adolescent or adult?: No Was the patient ever a victim of a crime or a disaster?: No Witnessed domestic violence?: No Has patient been affected by domestic violence as  an adult?: No  Child/Adolescent Assessment:     CCA Substance Use Alcohol/Drug Use: Alcohol / Drug Use Pain Medications: See MAR Prescriptions: See MAR Over the Counter: See MAR History of alcohol / drug use?: Yes Substance #1 Name of Substance 1: Cannabis 1 - Age of First Use: 18 1 - Amount (size/oz): Varies 1 - Frequency: Varies 1 - Duration: Ongoing 1 - Last Use / Amount: Pt states "a few days ago"                       ASAM's:  Six Dimensions of Multidimensional Assessment  Dimension 1:  Acute Intoxication and/or Withdrawal Potential:   Dimension 1:  Description of individual's past and current experiences of substance use and withdrawal: 1  Dimension 2:  Biomedical Conditions and Complications:   Dimension 2:  Description of patient's biomedical conditions and  complications: 1  Dimension 3:  Emotional, Behavioral, or Cognitive Conditions and Complications:  Dimension 3:  Description of emotional, behavioral, or cognitive conditions and complications: 1  Dimension 4:  Readiness to Change:  Dimension 4:  Description of Readiness to Change criteria: 1  Dimension 5:  Relapse, Continued use, or Continued Problem Potential:  Dimension 5:  Relapse, continued use, or continued problem potential critiera description: 1  Dimension 6:  Recovery/Living Environment:  Dimension 6:  Recovery/Iiving environment criteria description: 2  ASAM Severity Score: ASAM's Severity Rating Score: 7  ASAM Recommended Level of Treatment:     Substance use Disorder (SUD) Substance Use Disorder (SUD)  Checklist Symptoms of Substance Use: Continued use despite having a persistent/recurrent physical/psychological problem caused/exacerbated by use  Recommendations for Services/Supports/Treatments: Recommendations for Services/Supports/Treatments Recommendations For Services/Supports/Treatments: Medication Management  DSM5 Diagnoses: Patient Active Problem List   Diagnosis Date Noted  .  Cannabis use disorder, severe, dependence (HCC) 12/11/2014  . Schizoaffective disorder, bipolar type (HCC) 11/05/2014    Patient Centered Plan: Patient is on the following Treatment Plan(s):   Referrals to Alternative Service(s): Referred to Alternative Service(s):   Place:   Date:   Time:    Referred to Alternative Service(s):   Place:   Date:   Time:    Referred to Alternative Service(s):   Place:   Date:   Time:    Referred to Alternative Service(s):   Place:   Date:   Time:     Alfredia Ferguson, LCAS

## 2020-09-09 NOTE — H&P (Signed)
Behavioral Health Medical Screening Exam  C[name] is a 30 y.o. male with a past psychiatric history significant for schizoaffective disorder (bipolar type) and cannabis dependence who presents to [facility], accompanied by his mother. Patient reports that he has been hearing voices and wanting to harm himself over the past couple of weeks. Patient is affiliated with [facility] and he states that his ACT team member has not visited with him in the last 3 weeks to deliver his medications. Patient reports that the only medication he has received was his Haldol injection. Patient states that he normally takes trazodone 100 mg for his sleep but he has since run out of his medication after taking multiple pills in order to better manage his sleep. Due to his lack of medications, patient states he is going crazy.  Patient is cooperative and fully engaged in conversation during the encounter. Patient is visibly anxious and slightly agitated on exam. Patient denies active suicidal ideations but states that he was experiencing thoughts of harming himself. He denies homicidal ideations. Patient endorses auditory hallucinations that are command in nature. Patient denies active visual hallucinations. Patient states that he is trying not to harm anybody, but people keep wanting to talk to him and he doesn't have the time. Patient is tangential in his thought content and expresses ideas related to telepathy and animals communicating with one another through means of telepathy. Patient endorses poor sleep and states that he has not been able to receive even 5 hours of sleep. Patient endorses good appetite and states that he is able to eat. Patient endorses tobacco use and smokes roughly 6 packs per month. He denies alcohol consumption but states that he smokes marijuana and hemp roughly 2 times a week.  Total Time spent with patient: 15 minutes  Psychiatric Specialty Exam: Physical  Exam Constitutional:      General: He is in acute distress.     Appearance: Normal appearance.  HENT:     Head: Normocephalic and atraumatic.     Nose: Nose normal.  Pulmonary:     Effort: Pulmonary effort is normal.  Musculoskeletal:        General: Normal range of motion.     Cervical back: Normal range of motion and neck supple.  Skin:    General: Skin is warm and dry.  Neurological:     General: No focal deficit present.     Mental Status: He is alert and oriented to person, place, and time.  Psychiatric:        Attention and Perception: Attention normal. He perceives auditory hallucinations. He does not perceive visual hallucinations.        Mood and Affect: Mood is anxious and depressed.        Speech: Speech is rapid and pressured.        Behavior: Behavior is agitated. Behavior is cooperative.        Thought Content: Thought content includes suicidal ideation. Thought content does not include homicidal ideation. Thought content does not include suicidal plan.        Cognition and Memory: Memory normal. Cognition is impaired.        Judgment: Judgment normal.    Review of Systems  Constitutional: Negative.   HENT: Negative.   Eyes: Negative.   Respiratory: Negative.   Cardiovascular: Negative.   Gastrointestinal: Negative.   Genitourinary: Negative.   Musculoskeletal: Negative.   Skin: Negative.   Neurological: Negative.   Psychiatric/Behavioral: Positive  for agitation, dysphoric mood, hallucinations, sleep disturbance and suicidal ideas. Negative for decreased concentration and self-injury. The patient is nervous/anxious. The patient is not hyperactive.    There were no vitals taken for this visit.There is no height or weight on file to calculate BMI. General Appearance: Fairly Groomed Eye Contact:  Good Speech:  Clear and Coherent and Pressured Volume:  Normal Mood:  Anxious, Dysphoric and Irritable Affect:  Congruent and Depressed Thought Process:  Coherent,  Goal Directed and Descriptions of Associations: Tangential Orientation:  Full (Time, Place, and Person) Thought Content:  WDL, Illogical, Delusions, Hallucinations: Command:  Patient reports that he experienced voices telling him to kill himself and Tangential Suicidal Thoughts:  Yes.  without intent/plan Homicidal Thoughts:  No Memory:  Immediate;   Good Recent;   Fair Remote;   Fair Judgement:  Fair Insight:  Fair Psychomotor Activity:  Restlessness Concentration: Concentration: Good and Attention Span: Good Recall:  Good Fund of Knowledge:Fair Language: Good Akathisia:  NA Handed:  Right AIMS (if indicated):    Assets:  Communication Skills Desire for Improvement Housing Social Support Sleep:     Musculoskeletal: Strength & Muscle Tone: within normal limits Gait & Station: normal Patient leans: N/A  There were no vitals taken for this visit.  Recommendations: Based on my evaluation the patient does not appear to have an emergency medical condition. Since patient endorses passive suicide ideations and command-type auditory hallucination, patient meets criteria for inpatient psychiatric admission. Admission labs will be ordered and initiated upon conclusion of the encounter.  Meta Hatchet, PA 09/09/2020, 6:50 PM

## 2020-09-10 ENCOUNTER — Other Ambulatory Visit: Payer: Self-pay

## 2020-09-10 LAB — HEMOGLOBIN A1C
Hgb A1c MFr Bld: 4.5 % — ABNORMAL LOW (ref 4.8–5.6)
Mean Plasma Glucose: 82.45 mg/dL

## 2020-09-10 LAB — RAPID URINE DRUG SCREEN, HOSP PERFORMED
Amphetamines: NOT DETECTED
Barbiturates: NOT DETECTED
Benzodiazepines: NOT DETECTED
Cocaine: NOT DETECTED
Opiates: NOT DETECTED
Tetrahydrocannabinol: POSITIVE — AB

## 2020-09-10 LAB — TSH: TSH: 0.219 u[IU]/mL — ABNORMAL LOW (ref 0.350–4.500)

## 2020-09-10 LAB — LIPID PANEL
Cholesterol: 165 mg/dL (ref 0–200)
HDL: 44 mg/dL (ref >40)
LDL Cholesterol: 93 mg/dL (ref 0–99)
Total CHOL/HDL Ratio: 3.8 RATIO
Triglycerides: 141 mg/dL (ref <150)
VLDL: 28 mg/dL (ref 0–40)

## 2020-09-10 MED ORDER — TRAZODONE HCL 100 MG PO TABS
100.0000 mg | ORAL_TABLET | Freq: Every day | ORAL | Status: DC
  Administered 2020-09-10: 100 mg via ORAL
  Filled 2020-09-10 (×3): qty 1

## 2020-09-10 MED ORDER — NICOTINE POLACRILEX 2 MG MT GUM
2.0000 mg | CHEWING_GUM | OROMUCOSAL | Status: DC | PRN
  Administered 2020-09-10: 2 mg via ORAL

## 2020-09-10 MED ORDER — BENZTROPINE MESYLATE 2 MG PO TABS
2.0000 mg | ORAL_TABLET | Freq: Every day | ORAL | Status: DC
  Administered 2020-09-10: 2 mg via ORAL
  Filled 2020-09-10 (×3): qty 1

## 2020-09-10 MED ORDER — HALOPERIDOL 5 MG PO TABS
5.0000 mg | ORAL_TABLET | Freq: Every day | ORAL | Status: DC
  Administered 2020-09-10: 5 mg via ORAL
  Filled 2020-09-10: qty 14
  Filled 2020-09-10 (×2): qty 1

## 2020-09-10 MED ORDER — NICOTINE 21 MG/24HR TD PT24
21.0000 mg | MEDICATED_PATCH | Freq: Every day | TRANSDERMAL | Status: DC
  Administered 2020-09-10 – 2020-09-11 (×2): 21 mg via TRANSDERMAL
  Filled 2020-09-10 (×4): qty 1

## 2020-09-10 NOTE — [facility] Suicide Risk Assessment (Signed)
[facility] Admission Suicide Risk Assessment   Nursing information obtained from:  Patient Demographic factors:  Male,Living alone,Unemployed Current Mental Status: AH and self-harm thoughts prior to admission Loss Factors:  NA Historical Factors:  H/o cannabis use d/o and schizoaffective d/o diagnosis Risk Reduction Factors:  Has ACTT services, previous response to treatment, medication compliance with [facility]  Total Time Spent in Direct Patient Care:  I personally spent 45 minutes on the unit in direct patient care. The direct patient care time included face-to-face time with the patient, reviewing the patient's chart, communicating with other professionals, and coordinating care. Greater than 50% of this time was spent in counseling or coordinating care with the patient regarding goals of hospitalization, psycho-education, and discharge planning needs.  Principal Problem: Schizoaffective disorder, bipolar type (HCC) Diagnosis:  Principal Problem:   Schizoaffective disorder, bipolar type (HCC)  Subjective Data: Cody [name] is a 30 year old male with a past psychiatric history significant for schizoaffective disorder and cannabis use disorder who presented as a walk-in to [facility] on 09/09/20 due to worsening AH. The patient states he just got his own apartment and had not been sleeping well prior to admission. He became concerned when the [employer] started "making me feel like I wanted to hurt myself" because "they would not let me sleep." Today he states that after he slept well overnight his AH have resolved. He denies current command AH, VH, thought broadcasting/insertion/withdrawal, ideas of reference, or paranoia. He denies feeling depressed and denies current SI or HI. He denies recent drug or alcohol use. Per collateral from his ACT team nurse, he just got his Haldol Decanoate shot 100mg  2 weeks ago. See admission H&P for additional details.   Continued Clinical Symptoms:  Alcohol Use Disorder Identification  Test Final Score (AUDIT): 3 The "Alcohol Use Disorders Identification Test", Guidelines for Use in Primary Care, Second Edition.  [facility]). Score between 0-7:  no or low risk or alcohol related problems. Score between 8-15:  moderate risk of alcohol related problems. Score between 16-19:  high risk of alcohol related problems. Score 20 or above:  warrants further diagnostic evaluation for alcohol dependence and treatment.  CLINICAL FACTORS:   Previous Psychiatric Diagnoses and Treatments Schizoaffective d/o diagnosis  Musculoskeletal: Strength & Muscle Tone: within normal limits Gait & Station: normal Patient leans: N/A  Psychiatric Specialty Exam: Physical Exam Vitals reviewed.  HENT:     Head: Normocephalic.  Pulmonary:     Effort: Pulmonary effort is normal.  Neurological:     Mental Status: He is alert.     Review of Systems - see H&P  Blood pressure 110/71, pulse 97, temperature 98.6 F (37 C), temperature source Oral, resp. rate 16, height 5\' 9"  (1.753 m), weight 74.2 kg, SpO2 100 %.Body mass index is 24.14 kg/m.  General Appearance: casually dressed, adequate hygiene  Eye Contact:  Good  Speech:  Clear and Coherent and Normal Rate  Volume:  Normal  Mood:  euthymic  Affect:  Congruent  Thought Process:  Goal Directed  Orientation:  Full (Time, Place, and Person)  Thought Content:  Reports resolution of recent AH; denies VH, ideas of reference, paranoia or first rank symptoms; no acute psychosis noted on exam  Suicidal Thoughts:  No  Homicidal Thoughts:  No  Memory:  Recent;   Fair  Judgement:  Fair  Insight:  Fair  Psychomotor Activity:  Normal  Concentration:  Concentration: Fair and Attention Span: Fair  Recall:  05-20-1977 of Knowledge:  Fair  Language:  Good  Akathisia:  Negative  Assets:  Communication Skills Desire for Improvement Housing Resilience Social Support  ADL's:  Intact  Cognition:  WNL  Sleep:  Number of Hours:  6.75   COGNITIVE FEATURES THAT CONTRIBUTE TO RISK:  None    SUICIDE RISK:   Mild:  Suicidal ideation of limited frequency, intensity, duration, and specificity.  There are no identifiable plans, no associated intent, mild dysphoria and related symptoms, good self-control (both objective and subjective assessment), few other risk factors, and identifiable protective factors, including available and accessible social support.  PLAN OF CARE: Patient is currently voluntary. I am in agreement with plans to start Haldol 5mg  po tonight after NP discussed with his ACT team and confirmed his recent Haldol dec injection administration. If he does well, anticipate discharge with outpatient ACTT f/u. Will check CBC, CMP, UDS, Free T4 and repeat TSH. Admission labs reviewed: TSH 0.219, Lipid panel WNL, A1c 4.5, respiratory panel negative.EKG shows NSR 65bpm with QTc .   I certify that inpatient services furnished can reasonably be expected to improve the patient's condition.   , MD, FAPA 09/10/2020, 5:52 PM

## 2020-09-10 NOTE — [facility] Suicide Risk Assessment (Addendum)
[facility] Discharge Suicide Risk Assessment   Principal Problem: Schizoaffective disorder, bipolar type [facility]) Discharge Diagnoses: Principal Problem:   Schizoaffective disorder, bipolar type (HCC) Active Problems:   History of cannabis dependence/abuse (HCC)  Total Time Spent in Direct Patient Care:  I personally spent 30 minutes on the unit in direct patient care. The direct patient care time included face-to-face time with the patient, reviewing the patient's chart, communicating with other professionals, and coordinating care. Greater than 50% of this time was spent in counseling or coordinating care with the patient regarding goals of hospitalization, psycho-education, and discharge planning needs.  Subjective: Patient was seen today on rounds. He denies AVH, paranoia, ideas of reference, or first rank symptoms. He denies SI or HI and denies medication side-effects. He states he slept well overnight and has been eating well. He voices no physical complaints. He has plans to return to his apartment and clean it up today. He agrees to continued f/u with ACTT after discharge. I advised that lab came late today to draw blood and the results of his CBC, CMP and thyroid panel are pending. I advised that his TSH was initially low and he will need to see a PCP for recheck of possible thyroid disease after discharge. He states that thyroid issues run in the family. He verbalized understanding. I advised he will need ongoing metabolic lab monitoring and EKG monitoring while on an antipsychotic.  Musculoskeletal: Strength & Muscle Tone: within normal limits Gait & Station: normal, steady Patient leans: N/A  Psychiatric Specialty Exam: Review of Systems  Constitutional: Negative for appetite change.  Psychiatric/Behavioral: Negative for hallucinations, sleep disturbance and suicidal ideas.    Blood pressure (!) 131/97, pulse (!) 104, temperature 98 F (36.7 C), temperature source Oral, resp. rate 18,  height 5\' 9"  (1.753 m), weight 74.2 kg, SpO2 97 %.Body mass index is 24.14 kg/m.  General Appearance: casually dressed, adequate hygiene, well engaged with examiner  Eye Contact::  Good  Speech:  Clear and Coherent and Normal Rate409  Volume:  Normal  Mood:  Euthymic  Affect:  Congruent  Thought Process:  Goal Directed  Orientation:  Full (Time, Place, and Person)  Thought Content:  Logical and Denies AVH, paranoia, ideas of reference, or first rank symptoms; no acute psychosis noted on exam  Suicidal Thoughts:  Denied  Homicidal Thoughts:  Denied  Memory:  Recent;   Good  Judgement:  Fair  Insight:  Fair  Psychomotor Activity:  Normal, no cogwheeling or stiffness, no tremors  Concentration:  Good  Recall:  Fair  Fund of Knowledge:Good  Language: Good  Akathisia:  Negative  AIMS (if indicated):   0  Assets:  Communication Skills Desire for Improvement Housing Physical Health Resilience Social Support  Sleep:  Number of Hours: 6  Cognition: WNL  ADL's:  Intact   Mental Status Per Nursing Assessment::   On Admission:  AH and self-harm thoughts prior to admission  Demographic Factors:  Male, Living alone and Unemployed  Loss Factors: NA  Historical Factors: h/o cannabis use d/o and schizoaffective d/o diagnoses  Risk Reduction Factors:   ACTT services and compliant with [facility], positive social supports  Continued Clinical Symptoms:  Previous Psychiatric Diagnoses and Treatments  Cognitive Features That Contribute To Risk:  None    Suicide Risk:  Mild:  Suicidal ideation of limited frequency, intensity, duration, and specificity.  There are no identifiable plans, no associated intent, mild dysphoria and related symptoms, good self-control (both objective and subjective assessment), few other  risk factors, and identifiable protective factors, including available and accessible social support.  Follow-up Information    [facility] Follow up on 09/12/2020.    Why: Your ACTT Team will be in contact with you about your medications and therapy. Please have them set up primary care appointment to check Thyroid levels. Please have ACTT contact [place] in order to get additional labs that have been drawn.  Contact information: [address] [place] [address] [phone]              Plan Of Care/Follow-up recommendations:  Activity:  as tolerated Diet:  heart healthy Other:  Patient advised to remain compliant with medications and to keep follow up appointment with ACTT. He was advised to abstain from alcohol and illicit drug use after discharge. He was reminded he will need ongoing metabolic lab and CBC monitoring, EKG monitoring, and weight monitoring while on Haldol. He was advised that at this time, the results of his CBC and CMP are pending and he will be contacted if there are abnormalities. He was advised that his TSH was low on admission and a repeat TSH and FreeT4 are pending. He was instructed to see a primary care provider through his ACT team without fail for recheck of his thyroid labs.   Comer Locket, MD, FAPA 09/11/2020, 9:58 AM

## 2020-09-10 NOTE — Tx Team (Signed)
Initial Treatment Plan 09/10/2020 12:08 AM Cody [name] ZOX:[id number]    PATIENT STRESSORS: Marital or family conflict Medication change or noncompliance   PATIENT STRENGTHS: General fund of knowledge Motivation for treatment/growth   PATIENT IDENTIFIED PROBLEMS: Psychosis  Risk for suicide  "voices"                 DISCHARGE CRITERIA:  Improved stabilization in mood, thinking, and/or behavior Verbal commitment to aftercare and medication compliance  PRELIMINARY DISCHARGE PLAN: Attend aftercare/continuing care group Outpatient therapy  PATIENT/FAMILY INVOLVEMENT: This treatment plan has been presented to and reviewed with the patient, Cody [name].  The patient and family have been given the opportunity to ask questions and make suggestions.  Delos Haring, RN 09/10/2020, 12:08 AM

## 2020-09-10 NOTE — H&P (Signed)
Psychiatric Admission Assessment Adult  Patient Identification: Cody [name] MRN:  [id number] Date of Evaluation:  09/10/2020 Chief Complaint:  Severe episode of recurrent major depressive disorder, with psychotic features (HCC) [F33.3] Principal Diagnosis: Schizoaffective disorder, bipolar type (HCC) Diagnosis:  Principal Problem:   Schizoaffective disorder, bipolar type (HCC) Active Problems:   Cannabis use disorder, severe, dependence (HCC)  History of Present Illness: Cody [name] is a 30 year old male with a past psychiatric history significant for schizoaffective disorder and cannabis use disorder. He presented to [facility], voluntarily, as a walk-in accompanied by his mother for auditory hallucinations. He lives in his own apartment and is on SSDI. He is seen by [facility] ACT team and receives Haldol Dec 100 mg IM every 28 days.   Cody [name] stated he felt like he wanted to hurt himself yesterday because the voices would not let him sleep. He stated "they kept saying don't go to sleep." He stated he did not do anything to hurt himself and came to the hospital instead. He stated he lives in his own apartment and his mother checks on him every day. He feels safe at home. He stated the voices don't tell him to hurt himself or others, they were just not letting him get any sleep. He stated he takes Trazodone 100 mg at night for sleep and Cogentin every day. Patient has a history of Cannabis use disorder. His last UDS was on 3/14 and was negative. UDS pending. Labs reviewed: TSH 0.219, low. CBC with diff with WBC's 12.0, Neut# 9.9 otherwise no abnormalities. CMP with glucose 108, total bilirubin 1.3. Lipid panel normal, A1c 4.5. Will order Haldol 5 mg PO at bedtime for auditory hallucinations. Will continue to monitor for safety and offer support and encouragement.   Collateral from ACT team nurse: This provider verified his last injection with Jasmine December at [phone], his ACT team nurse who stated  it was given 2 weeks ago. His ACT team nurse stated G[place]am has been doing very well and has his own apartment and gets his shot every month. She agreed that some Haldol PO would be good to help quiet the voices.     Associated Signs/Symptoms: Depression Symptoms:  depressed mood, denies other depressive symptoms Duration of Depression Symptoms: Less than two weeks  (Hypo) Manic Symptoms:  Hallucinations,auditory Anxiety Symptoms:  None Psychotic Symptoms:  Hallucinations: Auditory PTSD Symptoms: none Total Time spent with patient: 1 hour  Past Psychiatric History: Schizoaffective Disorder, Bipolar type, Cannabis Use Disorder  Is the patient at risk to self? No.  Has the patient been a risk to self in the past 6 months? No.  Has the patient been a risk to self within the distant past? No.  Is the patient a risk to others? No.  Has the patient been a risk to others in the past 6 months? No.  Has the patient been a risk to others within the distant past? No.   Prior Inpatient Therapy:  Yes Prior Outpatient Therapy:  Yes  Alcohol Screening: 1. How often do you have a drink containing alcohol?: Monthly or less 2. How many drinks containing alcohol do you have on a typical day when you are drinking?: 3 or 4 3. How often do you have six or more drinks on one occasion?: Never AUDIT-C Score: 2 4. How often during the last year have you found that you were not able to stop drinking once you had started?: Never 5. How often during the last year have  you failed to do what was normally expected from you because of drinking?: Less than monthly 6. How often during the last year have you needed a first drink in the morning to get yourself going after a heavy drinking session?: Never 7. How often during the last year have you had a feeling of guilt of remorse after drinking?: Never 8. How often during the last year have you been unable to remember what happened the night before because you had  been drinking?: Never 9. Have you or someone else been injured as a result of your drinking?: No 10. Has a relative or friend or a doctor or another health worker been concerned about your drinking or suggested you cut down?: No Alcohol Use Disorder Identification Test Final Score (AUDIT): 3 Substance Abuse History in the last 12 months:  Yes.   Consequences of Substance Abuse: Medical Consequences:  Psychiatric admissions Previous Psychotropic Medications: Yes  Psychological Evaluations: Yes  Past Medical History:  Past Medical History:  Diagnosis Date  . Anxiety   . Mood disorder (HCC)   . Schizophrenia (HCC)    History reviewed. No pertinent surgical history. Family History: History reviewed. No pertinent family history. Family Psychiatric  History: Unknown, patient denies Tobacco Screening: Have you used any form of tobacco in the last 30 days? (Cigarettes, Smokeless Tobacco, Cigars, and/or Pipes): Yes Tobacco use, Select all that apply: 5 or more cigarettes per day Are you interested in Tobacco Cessation Medications?: Yes, will notify MD for an order Counseled patient on smoking cessation including recognizing danger situations, developing coping skills and basic information about quitting provided: Refused/Declined practical counseling Social History:  Social History   Substance and Sexual Activity  Alcohol Use Yes     Social History   Substance and Sexual Activity  Drug Use Yes  . Types: Marijuana, Methamphetamines    Additional Social History: Marital status: Single    Pain Medications: See MAR Prescriptions: See MAR Over the Counter: See MAR History of alcohol / drug use?: Yes Name of Substance 1: Cannabis 1 - Age of First Use: 18 1 - Amount (size/oz): Varies 1 - Frequency: Varies 1 - Duration: Ongoing 1 - Last Use / Amount: Pt states "a few days ago"      Allergies:   Allergies  Allergen Reactions  . Penicillins Hives and Other (See Comments)     Has  patient had a PCN reaction causing immediate rash, facial/tongue/throat swelling, SOB or lightheadedness with hypotension: No Has patient had a PCN reaction causing severe rash involving mucus membranes or skin necrosis: No Has patient had a PCN reaction that required hospitalization No Has patient had a PCN reaction occurring within the last 10 years: no If all of the above answers are "NO", then may proceed with Cephalosporin use.   Lab Results:  Results for orders placed or performed during the hospital encounter of 09/09/20 (from the past 48 hour(s))  Resp Panel by RT-PCR (Flu A&B, Covid) Nasopharyngeal Swab     Status: None   Collection Time: 09/09/20  6:31 PM   Specimen: Nasopharyngeal Swab; Nasopharyngeal(NP) swabs in vial transport medium  Result Value Ref Range   SARS Coronavirus 2 by RT PCR NEGATIVE NEGATIVE    Comment: (NOTE) SARS-CoV-2 target nucleic acids are NOT DETECTED.  The SARS-CoV-2 RNA is generally detectable in upper respiratory specimens during the acute phase of infection. The lowest concentration of SARS-CoV-2 viral copies this assay can detect is 138 copies/mL. A negative result does not preclude  SARS-Cov-2 infection and should not be used as the sole basis for treatment or other patient management decisions. A negative result may occur with  improper specimen collection/handling, submission of specimen other than nasopharyngeal swab, presence of viral mutation(s) within the areas targeted by this assay, and inadequate number of viral copies(<138 copies/mL). A negative result must be combined with clinical observations, patient history, and epidemiological information. The expected result is Negative.  Fact Sheet for Patients:  BloggerCourse.comhttps://www.fda.gov/media/152166/download  Fact Sheet for Healthcare Providers:  SeriousBroker.ithttps://www.fda.gov/media/152162/download  This test is no t yet approved or cleared by the [place] FDA and  has been authorized for detection  and/or diagnosis of SARS-CoV-2 by FDA under an Emergency Use Authorization (EUA). This EUA will remain  in effect (meaning this test can be used) for the duration of the COVID-19 declaration under Section 564(b)(1) of the Act, 21 U.S.C.section 360bbb-3(b)(1), unless the authorization is terminated  or revoked sooner.       Influenza A by PCR NEGATIVE NEGATIVE   Influenza B by PCR NEGATIVE NEGATIVE    Comment: (NOTE) The Xpert Xpress SARS-CoV-2/FLU/RSV plus assay is intended as an aid in the diagnosis of influenza from Nasopharyngeal swab specimens and should not be used as a sole basis for treatment. Nasal washings and aspirates are unacceptable for Xpert Xpress SARS-CoV-2/FLU/RSV testing.  Fact Sheet for Patients: BloggerCourse.comhttps://www.fda.gov/media/152166/download  Fact Sheet for Healthcare Providers: SeriousBroker.ithttps://www.fda.gov/media/152162/download  This test is not yet approved or cleared by the [place] FDA and has been authorized for detection and/or diagnosis of SARS-CoV-2 by FDA under an Emergency Use Authorization (EUA). This EUA will remain in effect (meaning this test can be used) for the duration of the COVID-19 declaration under Section 564(b)(1) of the Act, 21 U.S.C. section 360bbb-3(b)(1), unless the authorization is terminated or revoked.  Performed at [facility], [address] [place], [place] [address]   Hemoglobin A1c     Status: Abnormal   Collection Time: 09/10/20  6:19 AM  Result Value Ref Range   Hgb A1c MFr Bld 4.5 (L) 4.8 - 5.6 %    Comment: (NOTE) Pre diabetes:          5.7%-6.4%  Diabetes:              >6.4%  Glycemic control for   <7.0% adults with diabetes    Mean Plasma Glucose 82.45 mg/dL    Comment: Performed at [facility], [address] [place], [place] [address]  Lipid panel     Status: None   Collection Time: 09/10/20  6:19 AM  Result Value Ref Range   Cholesterol 165 0 - 200 mg/dL   Triglycerides 462141 <703<150  mg/dL   HDL 44 >50>40 mg/dL   Total CHOL/HDL Ratio 3.8 RATIO   VLDL 28 0 - 40 mg/dL   LDL Cholesterol 93 0 - 99 mg/dL    Comment:        Total Cholesterol/HDL:CHD Risk Coronary Heart Disease Risk Table                     Men   Women  1/2 Average Risk   3.4   3.3  Average Risk       5.0   4.4  2 X Average Risk   9.6   7.1  3 X Average Risk  23.4   11.0        Use the calculated Patient Ratio above and the CHD Risk Table to determine the  patient's CHD Risk.        ATP III CLASSIFICATION (LDL):  <100     mg/dL   Optimal  161-096  mg/dL   Near or Above                    Optimal  130-159  mg/dL   Borderline  045-409  mg/dL   High  >811     mg/dL   Very High Performed at [facility], [address] [place], [place] [address]   TSH     Status: Abnormal   Collection Time: 09/10/20  6:19 AM  Result Value Ref Range   TSH 0.219 (L) 0.350 - 4.500 uIU/mL    Comment: Performed by a 3rd Generation assay with a functional sensitivity of <=0.01 uIU/mL. Performed at [facility], [address], [place], [place] [address]     Blood Alcohol level:  Lab Results  Component Value Date   ETH <10 08/27/2019   ETH <10 06/18/2017    Metabolic Disorder Labs:  Lab Results  Component Value Date   HGBA1C 4.5 (L) 09/10/2020   MPG 82.45 09/10/2020   MPG 94 12/12/2014   Lab Results  Component Value Date   PROLACTIN 76.6 (H) 12/12/2014   Lab Results  Component Value Date   CHOL 165 09/10/2020   TRIG 141 09/10/2020   HDL 44 09/10/2020   CHOLHDL 3.8 09/10/2020   VLDL 28 09/10/2020   LDLCALC 93 09/10/2020   LDLCALC 89 12/12/2014    Current Medications: Current Facility-Administered Medications  Medication Dose Route Frequency Provider Last Rate Last Admin  . acetaminophen (TYLENOL) tablet 650 mg  650 mg Oral Q6H PRN Antonieta Pert, MD   650 mg at 09/10/20 0752  . alum & mag hydroxide-simeth (MAALOX/MYLANTA) 200-200-20 MG/5ML suspension 30 mL   30 mL Oral Q4H PRN Antonieta Pert, MD      . benztropine (COGENTIN) tablet 2 mg  2 mg Oral QHS Laveda Abbe, NP      . haloperidol (HALDOL) tablet 5 mg  5 mg Oral QHS Laveda Abbe, NP      . hydrOXYzine (ATARAX/VISTARIL) tablet 25 mg  25 mg Oral TID PRN Antonieta Pert, MD   25 mg at 09/09/20 2154  . magnesium hydroxide (MILK OF MAGNESIA) suspension 30 mL  30 mL Oral Daily PRN Antonieta Pert, MD      . nicotine (NICODERM CQ - dosed in mg/24 hours) patch 21 mg  21 mg Transdermal Daily Laveda Abbe, NP      . traZODone (DESYREL) tablet 100 mg  100 mg Oral QHS Laveda Abbe, NP       PTA Medications: Medications Prior to Admission  Medication Sig Dispense Refill Last Dose  . benztropine (COGENTIN) 2 MG tablet Take 2 mg by mouth at bedtime.     . haloperidol decanoate (HALDOL DECANOATE) 100 MG/ML injection Inject 100 mg into the muscle every 28 (twenty-eight) days.     . traZODone (DESYREL) 100 MG tablet Take 1 tablet (100 mg total) at bedtime as needed by mouth for sleep. (Patient taking differently: Take 100 mg by mouth at bedtime.) 30 tablet 0     Musculoskeletal: Strength & Muscle Tone: within normal limits Gait & Station: normal Patient leans: N/A  Psychiatric Specialty Exam:  Presentation  General Appearance: Appropriate for Environment; Casual  Eye Contact:Good  Speech:Clear and Coherent; Normal Rate  Speech Volume:Normal  Handedness:Right  Mood and Affect  Mood:Euthymic  Affect:Congruent  Thought Process  Thought Processes:Coherent; Goal Directed  Duration of Psychotic Symptoms: Less than six months  Past Diagnosis of Schizophrenia or Psychoactive disorder: Yes  Descriptions of Associations:Intact  Orientation:Full (Time, Place and Person)  Thought Content:Logical  Hallucinations:Hallucinations: Auditory  Ideas of Reference:None  Suicidal Thoughts:Suicidal Thoughts: No  Homicidal Thoughts:Homicidal Thoughts:  No  Sensorium  Memory:Immediate Fair; Recent Fair; Remote Fair  Judgment:Fair  Insight:Fair  Executive Functions  Concentration:Fair  Attention Span:Fair  Recall:Fair  Fund of Knowledge:Fair  Language:Fair  Psychomotor Activity  Psychomotor Activity:Psychomotor Activity: Normal  Assets  Assets:Communication Skills; Housing; Health and safety inspector; Leisure Time; Physical Health; Social Support; Resilience  Sleep  Sleep:Sleep: Good Number of Hours of Sleep: 6.75   Physical Exam: Physical Exam Vitals and nursing note reviewed.  Constitutional:      Appearance: Normal appearance.  HENT:     Head: Normocephalic.  Pulmonary:     Effort: Pulmonary effort is normal.  Musculoskeletal:        General: Normal range of motion.     Cervical back: Normal range of motion.  Neurological:     Mental Status: He is alert and oriented to person, place, and time.  Psychiatric:        Attention and Perception: Attention normal. He is attentive. He does not perceive auditory hallucinations.    ROS Blood pressure 110/71, pulse 97, temperature 98.6 F (37 C), temperature source Oral, resp. rate 16, height 5\' 9"  (1.753 m), weight 74.2 kg, SpO2 100 %. Body mass index is 24.14 kg/m.  Treatment Plan Summary: 1. Admit for crisis management and stabilization, estimated length of stay 3-5 days.    2. Medication management to reduce current symptoms to base line and improve the patient's overall level of functioning: See MAR, Md's SRA & treatment plan.   Observation Level/Precautions:  15 minute checks  Laboratory:  Will order UDS  Psychotherapy:  Group therapy  Medications:  See MAR  Consultations:  TBD  Discharge Concerns:  Safety, medication compliance  Estimated LOS: 3-5 days  Other:     Physician Treatment Plan for Primary Diagnosis: Schizoaffective disorder, bipolar type (HCC) Long Term Goal(s): Improvement in symptoms so as ready for discharge  Short Term Goals:  Ability to identify changes in lifestyle to reduce recurrence of condition will improve, Ability to demonstrate self-control will improve, Ability to maintain clinical measurements within normal limits will improve and Ability to identify triggers associated with substance abuse/mental health issues will improve  Physician Treatment Plan for Secondary Diagnosis: Principal Problem:   Schizoaffective disorder, bipolar type (HCC) Active Problems:   Cannabis use disorder, severe, dependence (HCC)  Long Term Goal(s): Improvement in symptoms so as ready for discharge  Short Term Goals: Ability to identify changes in lifestyle to reduce recurrence of condition will improve, Ability to verbalize feelings will improve, Compliance with prescribed medications will improve and Ability to identify triggers associated with substance abuse/mental health issues will improve  I certify that inpatient services furnished can reasonably be expected to improve the patient's condition.    , NP 3/29/20223:03 PM

## 2020-09-10 NOTE — Progress Notes (Signed)
Recreation Therapy Notes  Animal-Assisted Activity (AAA) Program Checklist/Progress Notes Patient Eligibility Criteria Checklist & Daily Group note for Rec Tx Intervention  Date: 3.29.22 Time: 1430 Location: [address]   AAA/T Program Assumption of Risk Form signed by Engineer, production or Parent Legal Guardian YES   Patient is free of allergies or severe asthma  YES   Patient reports no fear of animals YES   Patient reports no history of cruelty to animals  YES   Patient understands his/her participation is voluntary YES   Patient washes hands before animal contact YES   Patient washes hands after animal contact  YES   Education: Charity fundraiser, Appropriate Animal Interaction   Education Outcome: Acknowledges understanding/In group clarification offered/Needs additional education.   Clinical Observations/Feedback: Pt did not attend activity.    Caroll Rancher, LRT/CTRS    Caroll Rancher A 09/10/2020 3:22 PM

## 2020-09-10 NOTE — Plan of Care (Signed)
  Problem: Education: Goal: Knowledge of [facility] Education information/materials will improve Outcome: Progressing Goal: Emotional status will improve Outcome: Progressing Goal: Mental status will improve Outcome: Progressing Goal: Verbalization of understanding the information provided will improve Outcome: Progressing   

## 2020-09-10 NOTE — Progress Notes (Signed)
Pt stated he was doing better, pt kept to himself, pt stated AH less    09/10/20 2000  Psych Admission Type (Psych Patients Only)  Admission Status Voluntary  Psychosocial Assessment  Patient Complaints Anxiety;Isolation  Eye Contact Fair  Facial Expression Anxious;Pensive  Affect Anxious;Preoccupied  Furniture conservator/restorer;Restless  Appearance/Hygiene Disheveled  Behavior Characteristics Cooperative;Appropriate to situation  Mood Preoccupied  Thought Process  Coherency Blocking  Content Paranoia  Delusions Paranoid  Perception Hallucinations  Hallucination Auditory  Judgment Poor  Confusion None  Danger to Self  Current suicidal ideation? Denies  Danger to Others  Danger to Others None reported or observed

## 2020-09-10 NOTE — Progress Notes (Signed)
Progress note    09/10/20 0750  Psych Admission Type (Psych Patients Only)  Admission Status Voluntary  Psychosocial Assessment  Patient Complaints Anxiety;Isolation  Eye Contact Fair  Facial Expression Anxious;Pensive  Affect Anxious;Preoccupied  Furniture conservator/restorer;Restless  Appearance/Hygiene Disheveled  Behavior Characteristics Cooperative;Appropriate to situation;Anxious;Fidgety  Mood Anxious;Preoccupied;Pleasant  Thought Process  Coherency Blocking  Content Paranoia  Delusions Paranoid  Perception Hallucinations  Hallucination Auditory  Judgment Poor  Confusion None  Danger to Self  Current suicidal ideation? Denies  Danger to Others  Danger to Others None reported or observed

## 2020-09-10 NOTE — Progress Notes (Signed)
Patient ID: Cody [name], male   DOB: 08/20/1990, 30 y.o.   MRN: [id number]  Admission Note:  30 yr male who presents as Walk-In, in no acute distress for the treatment of Psychosis and SI. Pt appears flat and depressed. Pt was calm and cooperative with admission process. Pt denied SI/ HI / VH at this time. Pt stated he was hearing voices and told his mom that he did not care if he died and she convinced him to come to the hospital. Pt stated he wanted Nicotine gum .   Per Assessment:  30 y.o. male with a past psychiatric history significant for schizoaffective disorder (bipolar type) and cannabis dependence who presents to [facility], accompanied by his mother. Patient reports that he has been hearing voices and wanting to harm himself over the past couple of weeks. Patient is affiliated with [facility] and he states that his ACT team member has not visited with him in the last 3 weeks to deliver his medications. Patient reports that the only medication he has received was his Haldol injection. Patient states that he normally takes trazodone 100 mg for his sleep but he has since run out of his medication after taking multiple pills in order to better manage his sleep. Due to his lack of medications, patient states he is going crazy.  A: Skin was assessed and found to be clear of any abnormal marks apart from a old scar on abdomen, L-foot . PT searched and no contraband found, POC and unit policies explained and understanding verbalized. Consents obtained. Food and fluids offered, and  accepted.   R:Pt had no additional questions or concerns.

## 2020-09-11 DIAGNOSIS — F250 Schizoaffective disorder, bipolar type: Principal | ICD-10-CM

## 2020-09-11 LAB — CBC WITH DIFFERENTIAL/PLATELET
Abs Immature Granulocytes: 0.04 10*3/uL (ref 0.00–0.07)
Basophils Absolute: 0 10*3/uL (ref 0.0–0.1)
Basophils Relative: 1 %
Eosinophils Absolute: 0.1 10*3/uL (ref 0.0–0.5)
Eosinophils Relative: 1 %
HCT: 41.9 % (ref 39.0–52.0)
Hemoglobin: 13.6 g/dL (ref 13.0–17.0)
Immature Granulocytes: 1 %
Lymphocytes Relative: 25 %
Lymphs Abs: 1.6 10*3/uL (ref 0.7–4.0)
MCH: 29.4 pg (ref 26.0–34.0)
MCHC: 32.5 g/dL (ref 30.0–36.0)
MCV: 90.7 fL (ref 80.0–100.0)
Monocytes Absolute: 0.4 10*3/uL (ref 0.1–1.0)
Monocytes Relative: 6 %
Neutro Abs: 4.2 10*3/uL (ref 1.7–7.7)
Neutrophils Relative %: 66 %
Platelets: 226 10*3/uL (ref 150–400)
RBC: 4.62 MIL/uL (ref 4.22–5.81)
RDW: 12.7 % (ref 11.5–15.5)
WBC: 6.3 10*3/uL (ref 4.0–10.5)
nRBC: 0 % (ref 0.0–0.2)

## 2020-09-11 LAB — COMPREHENSIVE METABOLIC PANEL
ALT: 19 U/L (ref 0–44)
AST: 19 U/L (ref 15–41)
Albumin: 4.2 g/dL (ref 3.5–5.0)
Alkaline Phosphatase: 53 U/L (ref 38–126)
Anion gap: 8 (ref 5–15)
BUN: 9 mg/dL (ref 6–20)
CO2: 30 mmol/L (ref 22–32)
Calcium: 9.6 mg/dL (ref 8.9–10.3)
Chloride: 103 mmol/L (ref 98–111)
Creatinine, Ser: 1.04 mg/dL (ref 0.61–1.24)
GFR, Estimated: >60 mL/min (ref >60)
Glucose, Bld: 83 mg/dL (ref 70–99)
Potassium: 4.3 mmol/L (ref 3.5–5.1)
Sodium: 141 mmol/L (ref 135–145)
Total Bilirubin: 1.3 mg/dL — ABNORMAL HIGH (ref 0.3–1.2)
Total Protein: 6.8 g/dL (ref 6.5–8.1)

## 2020-09-11 LAB — T4, FREE: Free T4: 0.88 ng/dL (ref 0.61–1.12)

## 2020-09-11 LAB — TSH: TSH: 0.251 u[IU]/mL — ABNORMAL LOW (ref 0.350–4.500)

## 2020-09-11 MED ORDER — TRAZODONE HCL 100 MG PO TABS
100.0000 mg | ORAL_TABLET | Freq: Every day | ORAL | 0 refills | Status: DC
Start: 2020-09-11 — End: 2021-02-26

## 2020-09-11 MED ORDER — HALOPERIDOL 5 MG PO TABS: 5.0000 mg | ORAL_TABLET | Freq: Every day | ORAL | 0 refills | Status: DC

## 2020-09-11 NOTE — Progress Notes (Signed)
  [facility] Adult Case Management Discharge Plan :  Will you be returning to the same living situation after discharge:  Yes,  Home At discharge, do you have transportation home?: Yes,  Mother  Do you have the ability to pay for your medications: Yes,  Medicaid  Release of information consent forms completed and in the chart;  Patient's signature needed at discharge.  Patient to Follow up at:  Follow-up Information    [facility], [facility] Follow up on 09/12/2020.   Why: Your ACTT Team will be in contact with you about your medications and therapy. Please have them set up primary care appointment to check Thyroid levels. Please have ACTT contact [place] in order to get additional labs that have been drawn.  Contact information: [address] [place]ure[address] [place] [address] [phone]               Next level of care provider has access to [facility] Link:no  Safety Planning and Suicide Prevention discussed: Yes,  with patient   Have you used any form of tobacco in the last 30 days? (Cigarettes, Smokeless Tobacco, Cigars, and/or Pipes): Yes  Has patient been referred to the Quitline?: Patient refused referral  Patient has been referred for addiction treatment: N/A  Aram Beecham, LCSWA 09/11/2020, 10:47 AM

## 2020-09-11 NOTE — Progress Notes (Signed)
Recreation Therapy Notes  Date: 3.30.22 Time: 0930 Location: [address]  Group Topic: Stress Management   Goal Area(s) Addresses:  Patient will actively participate in stress management techniques presented during session.   Intervention: Stress management techniques  Activity :Guided Imagery  LRT read a script that focused on being in a peaceful meadow during the spring time.  Patients were to listen and follow along as the meditation played to fully participate in the activity.     Education:  Stress Management, Discharge Planning.   Education Outcome: Acknowledges education  Clinical Observations/Feedback: Patient did not attend group session.    Caroll Rancher, LRT/CTRS    Caroll Rancher A 09/11/2020 11:05 AM

## 2020-09-11 NOTE — Discharge Summary (Signed)
Physician Discharge Summary Note  Patient:  Cody [name] is an 30 y.o., male MRN:  [id number] DOB:  Dec 27, 1990 Patient phone:  [phone] (home)  Patient address:   [address] [name],  Total Time spent with patient: 30 minutes  Date of Admission:  09/09/2020 Date of Discharge: 09/11/2020  Reason for Admission:  (From NP's admission note): Cody [name] is a 30 year old male with a past psychiatric history significant for schizoaffective disorder and cannabis use disorder. [facility], as a walk-in accompanied by his mother for auditory hallucinations. He lives in his own apartment and is on SSDI. He is seen by [facility] ACT team and receives Haldol Dec 100 mg IM every 28 days.   Cody [name] stated he felt like he wanted to hurt himself yesterday because the voices would not let him sleep. He stated "they kept saying don't go to sleep." He stated he did not do anything to hurt himself and came to the hospital instead. He stated he lives in his own apartment and his mother checks on him every day. He feels safe at home. He stated the voices don't tell him to hurt himself or others, they were just not letting him get any sleep. He stated he takes Trazodone 100 mg at night for sleep and Cogentin every day. Patient has a history of Cannabis use disorder. His last UDS was on 3/14 and was negative. UDS pending. Labs reviewed: TSH 0.219, low. CBC with diff with WBC's 12.0, Neut# 9.9 otherwise no abnormalities. CMP with glucose 108, total bilirubin 1.3. Lipid panel normal, A1c 4.5. Will order Haldol 5 mg PO at bedtime for auditory hallucinations. Will continue to monitor for safety and offer support and encouragement.   Collateral from ACT team nurse: This provider verified his last injection with Jasmine DecemberSharon at [phone], his ACT team nurse who stated it was given 2 weeks ago. His ACT team nurse stated Cody [name] has been doing very well and has his own apartment  and gets his shot every month. She agreed that some Haldol PO would be good to help quiet the voices.    Evaluation on the unit, day of discharge: Patient was seen, chart reviewed and case discussed with the treatment team. Patient stated he feels better and the voices are not bothering him today. He denies SI/HI/AVH, paranoia and delusions. He is taking his medications and has no issues with them. He is sleeping and eating well. He has been attending group therapy. He is visible on the unit and interacts appropriately with staff and peers. Patient's ACT team has been notified of his discharge today. Patient was provided with 14 day sample of Haldol PO to augment his Haldol Dec 100 mg IM LAI which he received 2 weeks ago. His ACT team administers this medication and is also aware he will be taking Haldol 5 mg PO until he is due for his next injection. It may be that patient needs an increase in his injectable dose, this will be assessed by his AVT team. Patient is stable for discharge home today.   Principal Problem: Schizoaffective disorder, bipolar type [facility](HCC) Discharge Diagnoses: Principal Problem:   Schizoaffective disorder, bipolar type (HCC) Active Problems:   History of cannabis dependence/abuse (HCC)   Past Psychiatric History: See H&P  Past Medical History:  Past Medical History:  Diagnosis Date  . Anxiety   . Mood disorder (HCC)   . Schizophrenia (HCC)    History reviewed.  No pertinent surgical history. Family History: History reviewed. No pertinent family history. Family Psychiatric  History: See H&P Social History:  Social History   Substance and Sexual Activity  Alcohol Use Yes     Social History   Substance and Sexual Activity  Drug Use Yes  . Types: Marijuana, Methamphetamines    Social History   Socioeconomic History  . Marital status: Single    Spouse name: Not on file  . Number of children: Not on file  . Years of education: Not on file  . Highest education  level: Not on file  Occupational History  . Not on file  Tobacco Use  . Smoking status: Smoker, Current Status Unknown    Packs/day: 0.25  . Smokeless tobacco: Never Used  Vaping Use  . Vaping Use: Never used  Substance and Sexual Activity  . Alcohol use: Yes  . Drug use: Yes    Types: Marijuana, Methamphetamines  . Sexual activity: Not Currently    Birth control/protection: None  Other Topics Concern  . Not on file  Social History Narrative   ** Merged History Encounter **       Social Determinants of Health   Financial Resource Strain: Not on file  Food Insecurity: Not on file  Transportation Needs: Not on file  Physical Activity: Not on file  Stress: Not on file  Social Connections: Not on file    Hospital Course:  After the above admission evaluation, Cody [name]'s presenting symptoms were noted. He received his last Haldol Dec 100 mg long acting njection ~2 weeks ago. On admission, after a disscusion with his ACT team nurse Jasmine December, it was agreed that Haldol 5 mg PO at bedtime to augment his Haldol LAI and to help control the voices he was hearing on admission would be a good plan to bridge him over to his next injection in 2 weeks. The addition of the Haldol PO was discussed with him  & initiated with his consent.  His UDS on arrival to the ED was positive for [facility], his alcohol level was negative.  He was medicated, stabilized & discharged on the medications as listed on his discharge medication list below.  Besides the mood stabilization treatments, Cody [name] was also enrolled & participated in the group counseling sessions being offered & held on this unit. He learned coping skills. He presented no other significant pre-existing medical issues that required treatment. He tolerated his treatment regimen without any adverse effects or reactions reported.   During the course of his hospitalization, the 15-minute checks were adequate to ensure patient's safety. Cody [name] did not display  any dangerous, violent or suicidal behavior on the unit.  He interacted with patients & staff appropriately, participated appropriately in the group sessions/therapies. His medications were addressed & adjusted to meet his needs. He was recommended for outpatient follow-up care & medication management upon discharge to assure continuity of care & mood stability.  At the time of discharge patient is not reporting any acute suicidal/homicidal ideations. He feels more confident about his self-care & in managing his mental health. He currently denies any new issues or concerns. Education and supportive counseling provided throughout his hospital stay & upon discharge.   Today upon his discharge evaluation with the attending psychiatrist, Cody [name] shares he is doing well. He denies any other specific concerns. He is sleeping well. His appetite is good. He denies other physical complaints. He denies AH/VH, delusional thoughts or paranoia. He does not appear to be responding to any internal  stimuli. He feels that his medications have been helpful & is in agreement to continue his current treatment regimen as recommended. He was able to engage in safety planning including plan to return to [facility] or contact emergency services if he feels unable to maintain his own safety or the safety of others. Pt had no further questions, comments, or concerns. [facility] with all personal belongings in no apparent distress. Transportation home per his mother.   Physical Findings: AIMS: Facial and Oral Movements Muscles of Facial Expression: None, normal Lips and Perioral Area: None, normal Jaw: None, normal Tongue: None, normal,Extremity Movements Upper (arms, wrists, hands, fingers): None, normal Lower (legs, knees, ankles, toes): None, normal, Trunk Movements Neck, shoulders, hips: None, normal, Overall Severity Severity of abnormal movements (highest score from questions above): None, normal Incapacitation due to abnormal  movements: None, normal Patient's awareness of abnormal movements (rate only patient's report): No Awareness, Dental Status Current problems with teeth and/or dentures?: No Does patient usually wear dentures?: No  CIWA:    COWS:     Musculoskeletal: Strength & Muscle Tone: within normal limits Gait & Station: normal Patient leans: N/A  Psychiatric Specialty Exam:  Presentation  General Appearance: Appropriate for Environment; Casual  Eye Contact:Good  Speech:Clear and Coherent; Normal Rate  Speech Volume:Normal  Handedness:Right  Mood and Affect  Mood:Euthymic  Affect:Congruent  Thought Process  Thought Processes:Coherent; Goal Directed  Descriptions of Associations:Intact  Orientation:Full (Time, Place and Person)  Thought Content:Logical  History of Schizophrenia/Schizoaffective disorder:Yes  Duration of Psychotic Symptoms:Less than six months  Hallucinations:Hallucinations: Auditory  Ideas of Reference:None  Suicidal Thoughts:Suicidal Thoughts: No  Homicidal Thoughts:Homicidal Thoughts: No  Sensorium  Memory:Immediate Fair; Recent Fair; Remote Fair  Judgment:Fair  Insight:Fair  Executive Functions  Concentration:Fair  Attention Span:Fair  Recall:Fair  Fund of Knowledge:Fair  Language:Fair  Psychomotor Activity  Psychomotor Activity:Psychomotor Activity: Normal  Assets  Assets:Communication Skills; Housing; Health and safety inspector; Leisure Time; Physical Health; Social Support; Resilience  Sleep  Sleep:Sleep: Good Number of Hours of Sleep: 6  Physical Exam: Physical Exam Vitals and nursing note reviewed.  Constitutional:      Appearance: Normal appearance.  HENT:     Head: Normocephalic.  Pulmonary:     Effort: Pulmonary effort is normal.  Musculoskeletal:        General: Normal range of motion.     Cervical back: Normal range of motion.  Neurological:     General: No focal deficit present.     Mental Status: He is  alert and oriented to person, place, and time.    ROS Blood pressure (!) 131/97, pulse (!) 104, temperature 98 F (36.7 C), temperature source Oral, resp. rate 18, height 5\' 9"  (1.753 m), weight 74.2 kg, SpO2 97 %. Body mass index is 24.14 kg/m.   Have you used any form of tobacco in the last 30 days? (Cigarettes, Smokeless Tobacco, Cigars, and/or Pipes): Yes  Has this patient used any form of tobacco in the last 30 days? (Cigarettes, Smokeless Tobacco, Cigars, and/or Pipes) Yes, Yes, A prescription for an FDA-approved tobacco cessation medication was offered at discharge and the patient refused  Blood Alcohol level:  Lab Results  Component Value Date   [facility] <10 08/27/2019   ETH <10 06/18/2017    Metabolic Disorder Labs:  Lab Results  Component Value Date   HGBA1C 4.5 (L) 09/10/2020   MPG 82.45 09/10/2020   MPG 94 12/12/2014   Lab Results  Component Value Date   PROLACTIN 76.6 (H) 12/12/2014  Lab Results  Component Value Date   CHOL 165 09/10/2020   TRIG 141 09/10/2020   HDL 44 09/10/2020   CHOLHDL 3.8 09/10/2020   VLDL 28 09/10/2020   LDLCALC 93 09/10/2020   LDLCALC 89 12/12/2014    See Psychiatric Specialty Exam and Suicide Risk Assessment completed by Attending Physician prior to discharge.  Discharge destination:  Home  Is patient on multiple antipsychotic therapies at discharge:  No   Has Patient had three or more failed trials of antipsychotic monotherapy by history:  No  Recommended Plan for Multiple Antipsychotic Therapies: NA  Discharge Instructions    Diet - low sodium heart healthy   Complete by: As directed    Increase activity slowly   Complete by: As directed      Allergies as of 09/11/2020      Reactions   Penicillins Hives, Other (See Comments)   Has patient had a PCN reaction causing immediate rash, facial/tongue/throat swelling, SOB or lightheadedness with hypotension: No Has patient had a PCN reaction causing severe rash involving mucus  membranes or skin necrosis: No Has patient had a PCN reaction that required hospitalization No Has patient had a PCN reaction occurring within the last 10 years: no If all of the above answers are "NO", then may proceed with Cephalosporin use.      Medication List    TAKE these medications     Indication  benztropine 2 MG tablet Commonly known as: COGENTIN Take 2 mg by mouth at bedtime.  Indication: Extrapyramidal Reaction caused by Medications   haloperidol 5 MG tablet Commonly known as: HALDOL Take 1 tablet (5 mg total) by mouth at bedtime.  Indication: schizoaffective disorder   haloperidol decanoate 100 MG/ML injection Commonly known as: HALDOL DECANOATE Inject 100 mg into the muscle every 28 (twenty-eight) days.  Indication: Schizoaffective disorder bipolar type   traZODone 100 MG tablet Commonly known as: DESYREL Take 1 tablet (100 mg total) at bedtime as needed by mouth for sleep. What changed: when to take this  Indication: Trouble Sleeping       Follow-up Information    [facility], [facility] Follow up on 09/12/2020.   Why: Your ACTT Team will be in contact with you about your medications and therapy. Please have them set up primary care appointment to check Thyroid levels. Please have ACTT contact [place] in order to get additional labs that have been drawn.  Contact information: [address] [place] [address] [phone]               Follow-up recommendations:  Activity:  as tolerated Diet:  Heart healthy  Comments:  Sample medications given at discharge.  Patient agreeable to plan.  Patient was given opportunity to ask questions.  He appears to feel comfortable with discharge and denies any current suicidal or homicidal thoughts, auditory or visual hallucinations, paranoia or delusions.   Patient is instructed prior to discharge to: Take all medications as prescribed by his mental healthcare provider. Report any adverse effects and  or reactions from the medicines to his outpatient provider promptly. Patient has been instructed & cautioned: To not engage in alcohol and or illegal drug use while on prescription medicines. In the event of worsening symptoms, patient is instructed to call the crisis hotline, 911 and or go to the nearest ED for appropriate evaluation and treatment of symptoms. To follow-up with his primary care provider for your other medical issues, concerns and or health care needs.  Signed: Laveda Abbe, NP 09/11/2020, 10:08 AM

## 2020-09-11 NOTE — [facility] Counselor (Signed)
Adult Comprehensive Assessment  Patient ID: Cody [name], male   DOB: October 13, 1990, 30 y.o.   MRN: [id number]  Information Source:    Current Stressors:     Living/Environment/Situation:  Living Arrangements: Alone  Family History:  Marital status: Single  Childhood History:  Did patient suffer any verbal/emotional/physical/sexual abuse as a child?: No Did patient suffer from severe childhood neglect?: No Has patient ever been sexually abused/assaulted/raped as an adolescent or adult?: No Was the patient ever a victim of a crime or a disaster?: No Witnessed domestic violence?: No Has patient been affected by domestic violence as an adult?: No  Education:     Employment/Work Situation:   Employment situation: On disability Has patient ever been in the [name]?: No  Financial Resources:      Alcohol/Substance Abuse:      Social Support System:      Leisure/Recreation:   Do You Have Hobbies?: No  Strengths/Needs:      Discharge Plan:      Summary/Recommendations:   Summary and Recommendations (to be completed by the evaluator): Cody [name] is a 30 year old, AA, male who was admitted to the hospital Auditory Hallucinations and passive suicidal thoughts.  The Pt did not meet with CSW until discharge and all information contained here was gathered from other notes in the patient's chart.  The Pt currently lives on his own with assisstance from his mother, Cody [name].  The Pt's UDS upon admission was positive for Marijuana and negative for all other substances.  The Pt is established with an [facility] ACTT Team and is receiving therapy and medication management through this service.  The Pt reports that he has been medication compliant but has not been sleeping well for the past several days and this has caused his Auditory Hallucinations to worsen.  During his stay in the hospital the Pt states that he has been able to sleep better and no longer hears the  Auditory Hallucinations.  While in the hospital the Pt can benefit from crisis stabilization, medication evaluations, group therapy, psycho-education, case management, and discharge planning.  Upon discharge the Pt will return to their home and will follow up with their [facility] ACTT Team for mental health services.  Aram Beecham. 09/11/2020

## 2020-09-11 NOTE — [facility] Counselor (Signed)
CSW contacted the [facility] ACTT Team Jasmine December at [phone]) and informed them of the patient's discharge from the hospital today.  The ACTT Team states that they will be following up with the patient today or tomorrow at his home and will be calling his mother today to schedule an appointment with the doctor to get his Thyroid levels re-checked.

## 2020-09-11 NOTE — Progress Notes (Addendum)
Patient denies SI/HI. He denies AVH. He received both written and verbal discharge instructions. He verbalizes understanding of follow up appt and med regimen. He received an AVS, SRA, transitional record, prescription for trazodone, and a 14 day supply of sample medications (Haldol). Patient received belongings from locker. Patient safely discharged to the lobby and picked up by his mother.

## 2020-09-11 NOTE — [facility] Group Notes (Signed)
Type of Therapy and Topic:  Group Therapy - Healthy vs Unhealthy Coping Skills  Participation Level:  Active  Description of Group The focus of this group was to determine what unhealthy coping techniques typically are used by group members and what healthy coping techniques would be helpful in coping with various problems. Patients were guided in becoming aware of the differences between healthy and unhealthy coping techniques. Patients were asked to identify 2-3 healthy coping skills they would like to learn to use more effectively.  Therapeutic Goals 1. Patients learned that coping is what human beings do all day long to deal with various situations in their lives 2. Patients defined and discussed healthy vs unhealthy coping techniques 3. Patients identified their preferred coping techniques and identified whether these were healthy or unhealthy 4. Patients determined 2-3 healthy coping skills they would like to become more familiar with and use more often. 5. Patients provided support and ideas to each other   Summary of Patient Progress: The patient accepted the worksheets that were provided and states that they will speak with the CSW if there are any further questions about how to reduce stress.    Therapeutic Modalities Cognitive Behavioral Therapy Motivational Interviewing  

## 2020-09-11 NOTE — [facility] Suicide Risk Assessment (Signed)
[facility] INPATIENT:  Family/Significant Other Suicide Prevention Education  Suicide Prevention Education:  Patient Refusal for Family/Significant Other Suicide Prevention Education: The patient Cody [name] has refused to provide written consent for family/significant other to be provided Family/Significant Other Suicide Prevention Education during admission and/or prior to discharge.  Physician notified.  CSW completed SPE with the patient.  A suicide prevention pamphlet was placed in the patient's chart for review upon discharge.   [name] 09/11/2020, 10:30 AM

## 2020-09-11 NOTE — Tx Team (Signed)
Interdisciplinary Treatment and Diagnostic Plan Update  09/11/2020 Time of Session: 10:20am Cody [name] MRN: [id number]  Principal Diagnosis: Schizoaffective disorder, bipolar type [facility])  Secondary Diagnoses: Principal Problem:   Schizoaffective disorder, bipolar type (HCC) Active Problems:   History of cannabis dependence/abuse (HCC)   Current Medications:  Current Facility-Administered Medications  Medication Dose Route Frequency Provider Last Rate Last Admin  . acetaminophen (TYLENOL) tablet 650 mg  650 mg Oral Q6H PRN Antonieta Pert, MD   650 mg at 09/11/20 1256  . alum & mag hydroxide-simeth (MAALOX/MYLANTA) 200-200-20 MG/5ML suspension 30 mL  30 mL Oral Q4H PRN Antonieta Pert, MD      . benztropine (COGENTIN) tablet 2 mg  2 mg Oral QHS Laveda Abbe, NP   2 mg at 09/10/20 2141  . haloperidol (HALDOL) tablet 5 mg  5 mg Oral QHS Laveda Abbe, NP   5 mg at 09/10/20 2141  . hydrOXYzine (ATARAX/VISTARIL) tablet 25 mg  25 mg Oral TID PRN Antonieta Pert, MD   25 mg at 09/10/20 1659  . magnesium hydroxide (MILK OF MAGNESIA) suspension 30 mL  30 mL Oral Daily PRN Antonieta Pert, MD      . nicotine (NICODERM CQ - dosed in mg/24 hours) patch 21 mg  21 mg Transdermal Daily Laveda Abbe, NP   21 mg at 09/11/20 1001  . traZODone (DESYREL) tablet 100 mg  100 mg Oral QHS Laveda Abbe, NP   100 mg at 09/10/20 2141   PTA Medications: Medications Prior to Admission  Medication Sig Dispense Refill Last Dose  . benztropine (COGENTIN) 2 MG tablet Take 2 mg by mouth at bedtime.     . haloperidol decanoate (HALDOL DECANOATE) 100 MG/ML injection Inject 100 mg into the muscle every 28 (twenty-eight) days.     . traZODone (DESYREL) 100 MG tablet Take 1 tablet (100 mg total) at bedtime as needed by mouth for sleep. (Patient taking differently: Take 100 mg by mouth at bedtime.) 30 tablet 0     Patient Stressors: Marital or family conflict Medication  change or noncompliance  Patient Strengths: General fund of knowledge Motivation for treatment/growth  Treatment Modalities: Medication Management, Group therapy, Case management,  1 to 1 session with clinician, Psychoeducation, Recreational therapy.   Physician Treatment Plan for Primary Diagnosis: Schizoaffective disorder, bipolar type (HCC) Long Term Goal(s): Improvement in symptoms so as ready for discharge Improvement in symptoms so as ready for discharge   Short Term Goals: Ability to identify changes in lifestyle to reduce recurrence of condition will improve Ability to demonstrate self-control will improve Ability to maintain clinical measurements within normal limits will improve Ability to identify triggers associated with substance abuse/mental health issues will improve Ability to identify changes in lifestyle to reduce recurrence of condition will improve Ability to verbalize feelings will improve Compliance with prescribed medications will improve Ability to identify triggers associated with substance abuse/mental health issues will improve  Medication Management: Evaluate patient's response, side effects, and tolerance of medication regimen.  Therapeutic Interventions: 1 to 1 sessions, Unit Group sessions and Medication administration.  Evaluation of Outcomes: Adequate for Discharge  Physician Treatment Plan for Secondary Diagnosis: Principal Problem:   Schizoaffective disorder, bipolar type (HCC) Active Problems:   History of cannabis dependence/abuse (HCC)  Long Term Goal(s): Improvement in symptoms so as ready for discharge Improvement in symptoms so as ready for discharge   Short Term Goals: Ability to identify changes in lifestyle to reduce recurrence of  condition will improve Ability to demonstrate self-control will improve Ability to maintain clinical measurements within normal limits will improve Ability to identify triggers associated with substance  abuse/mental health issues will improve Ability to identify changes in lifestyle to reduce recurrence of condition will improve Ability to verbalize feelings will improve Compliance with prescribed medications will improve Ability to identify triggers associated with substance abuse/mental health issues will improve     Medication Management: Evaluate patient's response, side effects, and tolerance of medication regimen.  Therapeutic Interventions: 1 to 1 sessions, Unit Group sessions and Medication administration.  Evaluation of Outcomes: Adequate for Discharge   RN Treatment Plan for Primary Diagnosis: Schizoaffective disorder, bipolar type (HCC) Long Term Goal(s): Knowledge of disease and therapeutic regimen to maintain health will improve  Short Term Goals: Ability to disclose and discuss suicidal ideas and Compliance with prescribed medications will improve  Medication Management: RN will administer medications as ordered by provider, will assess and evaluate patient's response and provide education to patient for prescribed medication. RN will report any adverse and/or side effects to prescribing provider.  Therapeutic Interventions: 1 on 1 counseling sessions, Psychoeducation, Medication administration, Evaluate responses to treatment, Monitor vital signs and CBGs as ordered, Perform/monitor CIWA, COWS, AIMS and Fall Risk screenings as ordered, Perform wound care treatments as ordered.  Evaluation of Outcomes: Adequate for Discharge   LCSW Treatment Plan for Primary Diagnosis: Schizoaffective disorder, bipolar type (HCC) Long Term Goal(s): Safe transition to appropriate next level of care at discharge, Engage patient in therapeutic group addressing interpersonal concerns.  Short Term Goals: Engage patient in aftercare planning with referrals and resources, Increase social support, Identify triggers associated with mental health/substance abuse issues and Increase skills for  wellness and recovery  Therapeutic Interventions: Assess for all discharge needs, 1 to 1 time with Social worker, Explore available resources and support systems, Assess for adequacy in community support network, Educate family and significant other(s) on suicide prevention, Complete Psychosocial Assessment, Interpersonal group therapy.  Evaluation of Outcomes: Adequate for Discharge   Progress in Treatment: Attending groups: No. Participating in groups: No. Taking medication as prescribed: Yes. Toleration medication: Yes. Family/Significant other contact made: No, will contact:  pt declined consents Patient understands diagnosis: Yes. Discussing patient identified problems/goals with staff: Yes. Medical problems stabilized or resolved: Yes. Denies suicidal/homicidal ideation: Yes. Issues/concerns per patient self-inventory: No.   New problem(s) identified: No, Describe:  none  New Short Term/Long Term Goal(s): medication stabilization, elimination of SI thoughts, development of comprehensive mental wellness plan.   Patient Goals:  Did not attend  Discharge Plan or Barriers: Patient is to return home. ACTT is to follow up with patient at discharge.  Reason for Continuation of Hospitalization: Medication stabilization Suicidal ideation  Estimated Length of Stay: Adequate for discharge   Attendees: Patient: Cody [name] 09/11/2020  Physician: Bartholomew Crews, MD 09/11/2020   Nursing:  09/11/2020   RN Care Manager: 09/11/2020  Social Worker: Ruthann Cancer, LCSW 09/11/2020   Recreational Therapist:  09/11/2020   Other:  09/11/2020   Other:  09/11/2020   Other: 09/11/2020       Scribe for Treatment Team: Otelia Santee, LCSW 09/11/2020 2:15 PM

## 2020-09-12 NOTE — Plan of Care (Signed)
I reviewed the patient's labs that were pending on day of discharge. His repeat TSH is still low at 0.251 with normal Free T4 0.88. Patient had already been advised to see PCP for recheck of thyroid function labs as an outpatient to r/o hyperthyroidism. His repeat CBC was WNL. His repeat CMP was WNL except for total bili elevated at 1.3 which appears chronic. I have asked social work to contact the patient or his ACTT to notify them of lab results to ensure outpatient f/u.   Bartholomew Crews, MD, Celene Skeen

## 2021-02-26 ENCOUNTER — Other Ambulatory Visit: Payer: Self-pay

## 2021-02-26 ENCOUNTER — Ambulatory Visit (HOSPITAL_COMMUNITY)
Admission: RE | Admit: 2021-02-26 | Discharge: 2021-02-26 | Disposition: A | Discharge status: Home / Self Care | Payer: Medicaid Other | Attending: Psychiatry | Admitting: Psychiatry

## 2021-02-26 ENCOUNTER — Ambulatory Visit (HOSPITAL_COMMUNITY)
Admission: EM | Admit: 2021-02-26 | Discharge: 2021-02-27 | Disposition: A | Discharge status: Home / Self Care | Payer: Medicaid Other | Attending: Student | Admitting: Student

## 2021-02-26 DIAGNOSIS — F1220 Cannabis dependence, uncomplicated: Secondary | ICD-10-CM | POA: Insufficient documentation

## 2021-02-26 DIAGNOSIS — F250 Schizoaffective disorder, bipolar type: Secondary | ICD-10-CM | POA: Insufficient documentation

## 2021-02-26 DIAGNOSIS — Z20822 Contact with and (suspected) exposure to covid-19: Secondary | ICD-10-CM | POA: Insufficient documentation

## 2021-02-26 DIAGNOSIS — F419 Anxiety disorder, unspecified: Secondary | ICD-10-CM | POA: Insufficient documentation

## 2021-02-26 DIAGNOSIS — F17210 Nicotine dependence, cigarettes, uncomplicated: Secondary | ICD-10-CM | POA: Insufficient documentation

## 2021-02-26 LAB — POCT URINE DRUG SCREEN - MANUAL ENTRY (I-SCREEN)
POC Amphetamine UR: NOT DETECTED
POC Buprenorphine (BUP): NOT DETECTED
POC Cocaine UR: POSITIVE — AB
POC Marijuana UR: POSITIVE — AB
POC Methadone UR: NOT DETECTED
POC Methamphetamine UR: NOT DETECTED
POC Morphine: NOT DETECTED
POC Oxazepam (BZO): NOT DETECTED
POC Oxycodone UR: NOT DETECTED
POC Secobarbital (BAR): NOT DETECTED

## 2021-02-26 MED ORDER — ACETAMINOPHEN 325 MG PO TABS: 650.0000 mg | ORAL_TABLET | Freq: Four times a day (QID) | ORAL | Status: DC | PRN

## 2021-02-26 MED ORDER — MAGNESIUM HYDROXIDE 400 MG/5ML PO SUSP: 30.0000 mL | Freq: Every day | ORAL | Status: DC | PRN

## 2021-02-26 MED ORDER — ALUM & MAG HYDROXIDE-SIMETH 200-200-20 MG/5ML PO SUSP: 30.0000 mL | ORAL | Status: DC | PRN

## 2021-02-26 MED ORDER — BENZTROPINE MESYLATE 1 MG PO TABS
2.0000 mg | ORAL_TABLET | Freq: Every day | ORAL | Status: DC
  Administered 2021-02-27: 2 mg via ORAL
  Filled 2021-02-26: qty 2

## 2021-02-26 MED ORDER — HALOPERIDOL 5 MG PO TABS
5.0000 mg | ORAL_TABLET | Freq: Every day | ORAL | Status: DC
  Administered 2021-02-27: 5 mg via ORAL
  Filled 2021-02-26: qty 1

## 2021-02-26 MED ORDER — HYDROXYZINE HCL 25 MG PO TABS
25.0000 mg | ORAL_TABLET | Freq: Three times a day (TID) | ORAL | Status: DC | PRN
  Administered 2021-02-27: 25 mg via ORAL
  Filled 2021-02-26: qty 1

## 2021-02-26 MED ORDER — TRAZODONE HCL 100 MG PO TABS
100.0000 mg | ORAL_TABLET | Freq: Every evening | ORAL | Status: DC | PRN
  Administered 2021-02-27: 100 mg via ORAL
  Filled 2021-02-26: qty 1
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
## 2021-02-26 NOTE — H&P (Addendum)
Behavioral Health Medical Screening Exam  Visit Diagnosis:   -Schizoaffective disorder, bipolar type (HCC)  -Cannabis use disorder, severe, dependence (HCC)   Cody [name] is a 30 y.o. male with past psychiatric history significant for schizoaffective disorder, bipolar type and cannabis use disorder/abuse/dependence, who presents to the [facility]) unaccompanied as a voluntary walk-in.  The patient is asked why he came to the behavioral health Hospital, patient states "Basically I'm trying to detox and I'm hearing voices. I'm not in my right self right now". Patient denies SI on exam to myself, but per TTS counselor, patient endorsed passive SI with no plan to TTS counselor and patient also circled suicidal ideations on his intake sheet.  He denies history of any past suicide attempts.  He does endorse history of self-injurious behavior via cutting himself with a razor 2 weeks ago, but patient denies that this was a suicide attempt and he states that he cut himself at that time "not to kill but to ease the pain of the thoughts in my head".  Patient denies history of any additional self-injurious behavior via cutting.  He denies history of intentionally burning himself.  Patient endorses auditory hallucinations on exam and he states that he used to experience auditory hallucinations daily, but then progressed to experiencing auditory hallucinations twice per week over the past few months.  However, he does report that more recently over the past week, his auditory hallucinations have been becoming worse and more frequent in nature.  Patient describes his auditory hallucinations as the voices of multiple people (mostly females) and patient states that most of the voices belong to people that he knows such as "hometown friends, rich people, and gangbangers".  Patient states that the voices will say things to him such as "get my life together, get out of here, someone is going to kill  me".  Patient endorses history of experiencing visual hallucinations 2 years ago, but he states that he has not experienced visual hallucinations for 2 years now.  He denies paranoia or delusions.  He reports sleeping poorly, about 4 hours per night.  He denies anhedonia, feelings of guilt/hopelessness/worthlessness, energy changes, concentration changes, or appetite changes.  He does endorse weight loss of 5 pounds over the past 2 weeks.  Per chart review, patient was most recently psychiatrically hospitalized at [facility] from 09/09/2020 to 09/11/2020 and was discharged on prescriptions for Cogentin 2 mg p.o. at bedtime for extrapyramidal reaction caused by medications, Haldol 5 mg p.o. at bedtime for schizoaffective disorder, Haldol Decanoate 100 mg IM injection every 28 days for schizoaffective disorder bipolar type, and trazodone 100 mg p.o. at bedtime as needed for sleep.  Chart review of patient's 09/11/20 [facility] discharge summary also shows that the patient was to follow up with his ACTT team through [facility] on 09/12/2020.  Patient states that he does still have an ACT team through [facility] and he states that he has a psychiatric provider and therapist through his ACT team.  He states that he was taking the Cogentin 2 mg p.o. at bedtime and trazodone 100 mg p.o. at bedtime as needed for sleep, the patient states that he ran out of his Cogentin 2 weeks ago and ran out of his trazodone 1 week ago.  Patient states that he has not been taking Haldol 5 mg p.o.  Patient does state that he has been receiving the Haldol 100 mg IM injection, but Patient reports that he believes he last  received this injection 6-7 weeks ago and he states he believes he is 3 weeks overdue for this injection. Per review of previous PTA med reconciliation, it was documented on 09/10/20 that patient had last received this injection 2 weeks prior to 09/10/20 at that time. He reports that he last saw his ACT team 2 days ago.  When  patient is asked why he has not had his Haldol injection again or why his trazodone and Cogentin have not been refilled by his ACT team, patient states "they told me that they were going to give them to me but they never did".  Patient states that he has not taken any additional psychotropic medications or any additional home medications at this time.  Patient reports that he lives in [place] with his mother and father.  Patient denies access to firearms or weapons.  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  Patient initially denies alcohol use, but then states that he relapsed on alcohol about 4 days ago in which he states that he drank 2 standard size beers and 1 40 ounce beer at that time.  Patient states that prior to this relapse 4 days ago, he had not drank any alcohol for 5 years straight.  He denies history of alcohol withdrawal symptoms, delirium tremens, or seizures.  He endorses tobacco abuse, stating that he smokes 3 packs of cigarettes per day.  Patient also endorses smoking 1/4 pound of marijuana per month.  He reports smoking about "1/8th to a quarter" of marijuana daily and he reports he has been smoking marijuana since the age of 55.  He reports that he last used marijuana on the evening of 913 22-3 blunts of marijuana at that time.  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  Patient denies any additional substance use at this time.  Patient provided consent for myself and TTS counselor Al Corpus, [facility]) to speak with patient's mother [name]: [phone]), patient's father Baruc Tugwell.: [phone]) and patient's ACT team (patient did not know phone number for ACT team) for collateral information.  Myself and Al Corpus, [facility] attempted to contact patient's mother and father  via phone, but were unsuccessful and reaching patient's mother or father. Myself and Al Corpus, [facility] then contacted and [facility] [phone]) via phone and spoke with one of patient's ACT team qualified professionals (Otis "Odie" [place]) through the crisis line. Mr. Sedonia Small states that the patient does receive care from a psych NP through his ACT team and he states that he believes that the patient last saw his psych NP about 2 to 3 weeks ago.  Mr. Sedonia Small reports that he last saw the patient yesterday on 02/25/2021 and he reports that at that time, the patient told him that he was "running low" on some of his medications and patient told him that he was going to contact his pharmacy to have his medications refilled.  Mr. Sedonia Small states that he does not know what psychotropic medications (names or dosages) the patient is prescribed specifically by his ACT team.  Mr. Sedonia Small states that he sees the patient every Tuesday.  Mr. Sedonia Small denies any recent history of the patient endorsing SI or HI to him.  Mr. Sedonia Small states that the patient has refused  to have a therapist through his ACT team.  Mr. Sedonia Small states that he does have concerns for the patient's safety due to the patient's volatile levels of psychosis and he reports that he believes the patient may be a threat to himself or others at this time.  Total Time spent with patient: 30 minutes  Psychiatric Specialty Exam:  Presentation  General Appearance: Bizarre; Fairly Groomed  Eye Contact:Fleeting; Fair  Speech:Clear and Coherent; Normal Rate  Speech Volume:Normal  Handedness:Right   Mood and Affect  Mood:Depressed  Affect:Non-Congruent (Euthymic appearing)   Thought Process  Thought Processes:Coherent; Goal Directed  Descriptions of Associations:Intact  Orientation:Full (Time, Place and Person)  Thought Content:WDL  History of Schizophrenia/Schizoaffective disorder:Yes  Duration of Psychotic  Symptoms:Less than six months  Hallucinations:Hallucinations: Auditory Description of Auditory Hallucinations: See HPI for details.  Ideas of Reference:None  Suicidal Thoughts:Suicidal Thoughts: -- (Patient denies SI to myself, but per TTS counselor, patient endorsed passive SI with no plan to TTS counselor.)  Homicidal Thoughts:Homicidal Thoughts: No   Sensorium  Memory:Immediate Fair; Recent Fair; Remote Fair  Judgment:Fair  Insight:Fair   Executive Functions  Concentration:Fair  Attention Span:Fair  Recall:Fair  Fund of Knowledge:Fair  Language:Fair   Psychomotor Activity  Psychomotor Activity:Psychomotor Activity: Increased; Restlessness   Assets  Assets:Communication Skills; Desire for Improvement; Financial Resources/Insurance; Housing; Leisure Time; Physical Health; Social Support   Sleep  Sleep:Sleep: Poor Number of Hours of Sleep: 4    Physical Exam: Physical Exam Vitals reviewed.  Constitutional:      General: He is not in acute distress.    Appearance: He is not ill-appearing, toxic-appearing or diaphoretic.  HENT:     Head: Normocephalic and atraumatic.     Right Ear: External ear normal.     Left Ear: External ear normal.     Nose: Nose normal.  Eyes:     General:        Right eye: No discharge.        Left eye: No discharge.     Conjunctiva/sclera: Conjunctivae normal.  Cardiovascular:     Rate and Rhythm: Normal rate.  Pulmonary:     Effort: Pulmonary effort is normal. No respiratory distress.  Musculoskeletal:        General: Normal range of motion.     Cervical back: Normal range of motion.  Neurological:     General: No focal deficit present.     Mental Status: He is alert and oriented to person, place, and time.     Comments: No tremor noted.   Psychiatric:        Attention and Perception: He perceives auditory hallucinations. He does not perceive visual hallucinations.        Mood and Affect: Mood is depressed.         Speech: Speech normal.        Behavior: Behavior is hyperactive. Behavior is not agitated, slowed, aggressive, withdrawn or combative. Behavior is cooperative.        Thought Content: Thought content is not paranoid or delusional. Thought content does not include homicidal ideation.     Comments: Affect non-congruent with mood and euthymic appearing. Patient denies SI to myself, but per TTS counselor, patient endorsed passive SI with no plan to TTS counselor   Review of Systems  Constitutional:  Positive for weight loss. Negative for chills, diaphoresis, fever and malaise/fatigue.  HENT:  Negative for congestion.   Respiratory:  Negative for cough and shortness of breath.   Cardiovascular:  Negative for  chest pain and palpitations.  Gastrointestinal:  Negative for abdominal pain, constipation, diarrhea, nausea and vomiting.  Musculoskeletal:  Negative for joint pain and myalgias.  Neurological:  Negative for dizziness, tremors, seizures and headaches.  Psychiatric/Behavioral:  Positive for depression, hallucinations and suicidal ideas. Negative for memory loss and substance abuse. The patient does not have insomnia.        Patient denies SI to myself, but per TTS counselor, patient endorsed passive SI with no plan to TTS counselor.  All other systems reviewed and are negative.  Vitals: Blood pressure (!) 129/92, pulse 84, temperature 98.4 F (36.9 C), temperature source Oral, resp. rate 20, SpO2 100 %. There is no height or weight on file to calculate BMI.  Musculoskeletal: Strength & Muscle Tone: within normal limits Gait & Station: normal Patient leans: N/A   Recommendations:  Based on my evaluation the patient does not appear to have an emergency medical condition.  Based on patient's inconsistencies regarding suicidal ideation (see HPI for details), and his reported worsening auditory hallucinations, and collateral information obtained from patient's ACT team member, including  patient's ACT team members safety concerns regarding the patient, believe that the patient is a potential threat to himself at this time and recommend overnight continuous assessment for the patient. Will transfer the patient to the [facility] behavioral health urgent care [facility]) for overnight continuous assessment for further crisis stabilization and treatment, as well as for patient to be restarted on psychotropic medications.  Patient is agreeable to this plan.  Provider report given to [facility] Marzetta Board, RN), who gave report to [facility] provider Nira Conn, NP) and they both have agreed to accept the patient.  EMTALA form completed.  Jaclyn Shaggy, PA-C 02/26/2021, 10:36 PM

## 2021-02-26 NOTE — ED Provider Notes (Signed)
Behavioral Health Admission [facility]&P [facility] & OBS)  Date: 02/27/21 Patient Name: Cody [name] MRN: [id number] Chief Complaint:  Chief Complaint  Patient presents with   Hallucinations      Diagnoses:  Final diagnoses:  Schizoaffective disorder, bipolar type (HCC)  Cannabis use disorder, severe, dependence (HCC)    HPI: Cody [name] is a 30 y.o. male with past psychiatric history significant for schizoaffective disorder, bipolar type and cannabis use disorder/abuse/dependence, who presents to the [facility] behavioral health urgent care [facility]) as a voluntary direct admit transfer from [facility]).  Patient presented to [facility] unaccompanied as a voluntary walk-in earlier this evening on 02/26/2021.  Patient was evaluated by TTS and myself at that time and based on patient's walk-in evaluation, it was recommended by myself for the patient to be transferred to [facility] for overnight continuous assessment for further crisis stabilization and treatment.   Per my HPI from my earlier recent 02/26/21 [facility] MSE note for my earlier recent 02/26/21 [facility] walk-in evaluation of the patient:  "Cody [name] is a 30 y.o. male with past psychiatric history significant for schizoaffective disorder, bipolar type and cannabis use disorder/abuse/dependence, who presents to the [facility] behavioral [facility]) unaccompanied as a voluntary walk-in.  The patient is asked why he came to the behavioral health Hospital, patient states "Basically I'm trying to detox and I'm hearing voices. I'm not in my right self right now". Patient denies SI on exam to myself, but per TTS counselor, patient endorsed passive SI with no plan to TTS counselor and patient also circled suicidal ideations on his intake sheet.  He denies history of any past suicide attempts.  He does endorse history of self-injurious behavior via cutting himself with a razor 2 weeks ago, but patient denies that this was a suicide attempt  and he states that he cut himself at that time "not to kill but to ease the pain of the thoughts in my head".  Patient denies history of any additional self-injurious behavior via cutting.  He denies history of intentionally burning himself.  Patient endorses auditory hallucinations on exam and he states that he used to experience auditory hallucinations daily, but then progressed to experiencing auditory hallucinations twice per week over the past few months.  However, he does report that more recently over the past week, his auditory hallucinations have been becoming worse and more frequent in nature.  Patient describes his auditory hallucinations as the voices of multiple people (mostly females) and patient states that most of the voices belong to people that he knows such as "hometown friends, rich people, and gangbangers".  Patient states that the voices will say things to him such as "get my life together, get out of here, someone is going to kill me".  Patient endorses history of experiencing visual hallucinations 2 years ago, but he states that he has not experienced visual hallucinations for 2 years now.  He denies paranoia or delusions.  He reports sleeping poorly, about 4 hours per night.  He denies anhedonia, feelings of guilt/hopelessness/worthlessness, energy changes, concentration changes, or appetite changes.  He does endorse weight loss of 5 pounds over the past 2 weeks.  Per chart review, patient was most recently psychiatrically hospitalized at [facility] from 09/09/2020 to 09/11/2020 and was discharged on prescriptions for Cogentin 2 mg p.o. at bedtime for extrapyramidal reaction caused by medications, Haldol 5 mg p.o. at bedtime for schizoaffective disorder, Haldol Decanoate 100 mg IM injection every 28 days for  schizoaffective disorder bipolar type, and trazodone 100 mg p.o. at bedtime as needed for sleep.  Chart review of patient's 09/11/20 [facility] discharge summary also shows that the patient was to  follow up with his ACTT team through [facility] on 09/12/2020.  Patient states that he does still have an ACT team through [facility] and he states that he has a psychiatric provider and therapist through his ACT team.  He states that he was taking the Cogentin 2 mg p.o. at bedtime and trazodone 100 mg p.o. at bedtime as needed for sleep, the patient states that he ran out of his Cogentin 2 weeks ago and ran out of his trazodone 1 week ago.  Patient states that he has not been taking Haldol 5 mg p.o.  Patient does state that he has been receiving the Haldol 100 mg IM injection, but Patient reports that he believes he last received this injection 6-7 weeks ago and he states he believes he is 3 weeks overdue for this injection. Per review of previous PTA med reconciliation, it was documented on 09/10/20 that patient had last received this injection 2 weeks prior to 09/10/20 at that time. He reports that he last saw his ACT team 2 days ago.  When patient is asked why he has not had his Haldol injection again or why his trazodone and Cogentin have not been refilled by his ACT team, patient states "they told me that they were going to give them to me but they never did".  Patient states that he has not taken any additional psychotropic medications or any additional home medications at this time.  Patient reports that he lives in [place] with his mother and father.  Patient denies access to firearms or weapons.  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  Patient initially denies alcohol use, but then states that he relapsed on alcohol about 4 days ago in which he states that he drank 2 standard size beers and 1 40 ounce beer at that time.  Patient states that prior to this relapse 4 days ago, he had not drank any alcohol for 5 years straight.  He denies history of alcohol withdrawal symptoms, delirium tremens, or seizures.  He  endorses tobacco abuse, stating that he smokes 3 packs of cigarettes per day.  Patient also endorses smoking 1/4 pound of marijuana per month.  He reports smoking about "1/8th to a quarter" of marijuana daily and he reports he has been smoking marijuana since the age of 64.  He reports that he last used marijuana on the evening of 913 22-3 blunts of marijuana at that time.  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  Patient denies any additional substance use at this time.   Patient provided consent for myself and TTS counselor Al Corpus, [facility]) to speak with patient's mother [name]: [phone]), patient's father Reyden Smith.: [phone]) and patient's ACT team (patient did not know phone number for ACT team) for collateral information.  Myself and Al Corpus, [facility] attempted to contact patient's mother and father via phone, but were unsuccessful and reaching patient's mother or father. Myself and Al Corpus, [facility] then contacted and [facility] (([phone]) via phone and spoke with one of patient's ACT team  qualified professionals (Otis "Odie" Wink) through the crisis line. Mr. Sedonia Small states that the patient does receive care from a psych NP through his ACT team and he states that he believes that the patient last saw his psych NP about 2 to 3 weeks ago.  Mr. Sedonia Small reports that he last saw the patient yesterday on 02/25/2021 and he reports that at that time, the patient told him that he was "running low" on some of his medications and patient told him that he was going to contact his pharmacy to have his medications refilled.  Mr. Sedonia Small states that he does not know what psychotropic medications (names or dosages) the patient is prescribed specifically by his ACT team.  Mr. Sedonia Small states that he sees the patient every Tuesday.  Mr. Sedonia Small denies any  recent history of the patient endorsing SI or HI to him.  Mr. Sedonia Small states that the patient has refused to have a therapist through his ACT team.  Mr. Sedonia Small states that he does have concerns for the patient's safety due to the patient's volatile levels of psychosis and he reports that he believes the patient may be a threat to himself or others at this time."  Please see my earlier 02/26/21 [facility] MSE note for further details regarding patient's 02/26/2021 behavioral health Hospital walk-in evaluation if necessary.  Patient seen and examined by myself a second time upon his arrival to [facility] from [facility].  Patient denies SI or HI at this time.  Patient does continue to endorse auditory hallucinations that are similar in nature to the auditory hallucinations he endorsed to me earlier on initial evaluation earlier this evening (see HPI for details).  Patient denies any physical symptoms or physical complaints at this time.  Additionally, of note, patient's UDS was positive for marijuana and cocaine (patient denied use of any additional illicit substances currently aside from marijuana on evaluation).   PHQ 2-9:   Flowsheet Row ED from 02/26/2021 in [facility] Admission (Discharged[facility] from OP [facility]  C-SSRS RISK CATEGORY High Risk Low Risk        Total Time spent with patient: 30 minutes  Musculoskeletal  Strength & Muscle Tone: within normal limits Gait & Station: normal Patient leans: N/A  Psychiatric Specialty Exam  Presentation General Appearance: Fairly Groomed; Bizarre  Eye Contact:Fair  Speech:Clear and Coherent; Normal Rate  Speech Volume:Normal  Handedness:Right   Mood and Affect  Mood:Depressed  Affect:Non-Congruent (Euthymic appearing)   Thought Process  Thought Processes:Coherent; Goal Directed  Descriptions of Associations:Intact  Orientation:Full (Time, Place and  Person)  Thought Content:WDL  Diagnosis of Schizophrenia or Schizoaffective disorder in past: Yes  Duration of Psychotic Symptoms: Less than six months  Hallucinations:Hallucinations: Auditory Description of Auditory Hallucinations: See HPI for details.  Ideas of Reference:None  Suicidal Thoughts:Suicidal Thoughts: No  Homicidal Thoughts:Homicidal Thoughts: No   Sensorium  Memory:Immediate Fair; Recent Fair; Remote Fair  Judgment:Fair  Insight:Fair   Executive Functions  Concentration:Fair  Attention Span:Fair  Recall:Fair  Fund of Knowledge:Fair  Language:Fair   Psychomotor Activity  Psychomotor Activity:Psychomotor Activity: Increased; Restlessness   Assets  Assets:Communication Skills; Desire for Improvement; Financial Resources/Insurance; Housing; Leisure Time; Physical Health; Social Support   Sleep  Sleep:Sleep: Poor Number of Hours of Sleep: 4   Nutritional Assessment (For OBS and FBC admissions only) Has the patient had a weight loss or gain of 10 pounds or more in the last 3 months?: No Has the  patient had a decrease in food intake/or appetite?: No Does the patient have dental problems?: No Does the patient have eating habits or behaviors that may be indicators of an eating disorder including binging or inducing vomiting?: No Has the patient recently lost weight without trying?: 0 Has the patient been eating poorly because of a decreased appetite?: 0 Malnutrition Screening Tool Score: 0    Physical Exam: Physical Exam Vitals reviewed.  Constitutional:      General: He is not in acute distress.    Appearance: He is not ill-appearing, toxic-appearing or diaphoretic.  HENT:     Head: Normocephalic and atraumatic.     Right Ear: External ear normal.     Left Ear: External ear normal.     Nose: Nose normal.  Eyes:     General:        Right eye: No discharge.        Left eye: No discharge.     Conjunctiva/sclera: Conjunctivae normal.   Cardiovascular:     Rate and Rhythm: Normal rate.  Pulmonary:     Effort: Pulmonary effort is normal. No respiratory distress.  Musculoskeletal:        General: Normal range of motion.     Cervical back: Normal range of motion.  Neurological:     General: No focal deficit present.     Mental Status: He is alert and oriented to person, place, and time.     Comments: No tremor noted.   Psychiatric:        Attention and Perception: He perceives auditory hallucinations. He does not perceive visual hallucinations.        Mood and Affect: Mood is depressed.        Speech: Speech normal.        Behavior: Behavior is hyperactive. Behavior is not agitated, slowed, aggressive, withdrawn or combative. Behavior is cooperative.        Thought Content: Thought content is not paranoid or delusional. Thought content does not include homicidal ideation.     Comments: Affect non-congruent with mood and euthymic appearing. Patient denies SI at this time.   Review of Systems  Constitutional:  Positive for weight loss. Negative for chills, diaphoresis, fever and malaise/fatigue.  HENT:  Negative for congestion.   Respiratory:  Negative for cough and shortness of breath.   Cardiovascular:  Negative for chest pain and palpitations.  Gastrointestinal:  Negative for abdominal pain, constipation, diarrhea, nausea and vomiting.  Musculoskeletal:  Negative for joint pain and myalgias.  Neurological:  Negative for dizziness, tremors, seizures and headaches.  Psychiatric/Behavioral:  Positive for depression, hallucinations and suicidal ideas. Negative for memory loss and substance abuse. The patient does not have insomnia.        Patient denies SI at this time.  All other systems reviewed and are negative.  Vitals: Blood pressure (!) 129/95, pulse 76, temperature 98.8 F (37.1 C), temperature source Oral, resp. rate 20, SpO2 99 %. There is no height or weight on file to calculate BMI.  Past Psychiatric  History: Schizoaffective disorder, bipolar type and cannabis use disorder/abuse/dependence. Per chart review, patient was most recently psychiatrically hospitalized at [facility] from 09/09/2020 to 09/11/2020.   Is the patient at risk to self? Yes  Has the patient been a risk to self in the past 6 months? No .    Has the patient been a risk to self within the distant past? No   Is the patient a risk to others? No  Has the patient been a risk to others in the past 6 months? No   Has the patient been a risk to others within the distant past? No   Past Medical History:  Past Medical History:  Diagnosis Date   Anxiety    Mood disorder (HCC)    Schizophrenia (HCC)    No past surgical history on file.  Family History: No family history on file.  Social History:  Social History   Socioeconomic History   Marital status: Single    Spouse name: Not on file   Number of children: Not on file   Years of education: Not on file   Highest education level: Not on file  Occupational History   Not on file  Tobacco Use   Smoking status: Smoker, Current Status Unknown    Packs/day: 0.25    Types: Cigarettes   Smokeless tobacco: Never  Vaping Use   Vaping Use: Never used  Substance and Sexual Activity   Alcohol use: Yes   Drug use: Yes    Types: Marijuana, Methamphetamines   Sexual activity: Not Currently    Birth control/protection: None  Other Topics Concern   Not on file  Social History Narrative   ** Merged History Encounter **       Social Determinants of Health   Financial Resource Strain: Not on file  Food Insecurity: Not on file  Transportation Needs: Not on file  Physical Activity: Not on file  Stress: Not on file  Social Connections: Not on file  Intimate Partner Violence: Not on file    SDOH:  SDOH Screenings   Alcohol Screen: Low Risk    Last Alcohol Screening Score (AUDIT): 3  Depression (PHQ2-9): Not on file  Financial Resource Strain: Not on file  Food Insecurity:  Not on file  Housing: Not on file  Physical Activity: Not on file  Social Connections: Not on file  Stress: Not on file  Tobacco Use: High Risk   Smoking Tobacco Use: Smoker, Current Status Unknown   Smokeless Tobacco Use: Never  Transportation Needs: Not on file    Last Labs:  Admission on 02/26/2021  Component Date Value Ref Range Status   SARS Coronavirus 2 by RT PCR 02/27/2021 NEGATIVE  NEGATIVE Final   Comment: (NOTE) SARS-CoV-2 target nucleic acids are NOT DETECTED.  The SARS-CoV-2 RNA is generally detectable in upper respiratory specimens during the acute phase of infection. The lowest concentration of SARS-CoV-2 viral copies this assay can detect is 138 copies/mL. A negative result does not preclude SARS-Cov-2 infection and should not be used as the sole basis for treatment or other patient management decisions. A negative result may occur with  improper specimen collection/handling, submission of specimen other than nasopharyngeal swab, presence of viral mutation(s) within the areas targeted by this assay, and inadequate number of viral copies(<138 copies/mL). A negative result must be combined with clinical observations, patient history, and epidemiological information. The expected result is Negative.  Fact Sheet for Patients:  BloggerCourse.com  Fact Sheet for Healthcare Providers:  SeriousBroker.it  This test is no                          t yet approved or cleared by the [place] FDA and  has been authorized for detection and/or diagnosis of SARS-CoV-2 by FDA under an Emergency Use Authorization (EUA). This EUA will remain  in effect (meaning this test can be used) for the duration of the  COVID-19 declaration under Section 564(b)(1) of the Act, 21 U.S.C.section 360bbb-3(b)(1), unless the authorization is terminated  or revoked sooner.       Influenza A by PCR 02/27/2021 NEGATIVE  NEGATIVE Final    Influenza B by PCR 02/27/2021 NEGATIVE  NEGATIVE Final   Comment: (NOTE) The Xpert Xpress SARS-CoV-2/FLU/RSV plus assay is intended as an aid in the diagnosis of influenza from Nasopharyngeal swab specimens and should not be used as a sole basis for treatment. Nasal washings and aspirates are unacceptable for Xpert Xpress SARS-CoV-2/FLU/RSV testing.  Fact Sheet for Patients: BloggerCourse.com  Fact Sheet for Healthcare Providers: SeriousBroker.it  This test is not yet approved or cleared by the [place] FDA and has been authorized for detection and/or diagnosis of SARS-CoV-2 by FDA under an Emergency Use Authorization (EUA). This EUA will remain in effect (meaning this test can be used) for the duration of the COVID-19 declaration under Section 564(b)(1) of the Act, 21 U.S.C. section 360bbb-3(b)(1), unless the authorization is terminated or revoked.  Performed at [facility], [address] [place], [place] [address]    SARS Coronavirus 2 Ag 02/27/2021 Negative  Negative Preliminary   WBC 02/27/2021 9.4  4.0 - 10.5 K/uL Final   RBC 02/27/2021 4.89  4.22 - 5.81 MIL/uL Final   Hemoglobin 02/27/2021 14.4  13.0 - 17.0 g/dL Final   HCT 34/91/7915 43.8  39.0 - 52.0 % Final   MCV 02/27/2021 89.6  80.0 - 100.0 fL Final   MCH 02/27/2021 29.4  26.0 - 34.0 pg Final   MCHC 02/27/2021 32.9  30.0 - 36.0 g/dL Final   RDW 05/69/7948 12.5  11.5 - 15.5 % Final   Platelets 02/27/2021 232  150 - 400 K/uL Final   nRBC 02/27/2021 0.0  0.0 - 0.2 % Final   Neutrophils Relative % 02/27/2021 63  % Final   Neutro Abs 02/27/2021 5.9  1.7 - 7.7 K/uL Final   Lymphocytes Relative 02/27/2021 28  % Final   Lymphs Abs 02/27/2021 2.6  0.7 - 4.0 K/uL Final   Monocytes Relative 02/27/2021 8  % Final   Monocytes Absolute 02/27/2021 0.7  0.1 - 1.0 K/uL Final   Eosinophils Relative 02/27/2021 1  % Final   Eosinophils Absolute 02/27/2021 0.1  0.0 - 0.5  K/uL Final   Basophils Relative 02/27/2021 0  % Final   Basophils Absolute 02/27/2021 0.0  0.0 - 0.1 K/uL Final   Immature Granulocytes 02/27/2021 0  % Final   Abs Immature Granulocytes 02/27/2021 0.04  0.00 - 0.07 K/uL Final   Performed at [facility], [address], [place], [place] [address]   Sodium 02/27/2021 137  135 - 145 mmol/L Final   Potassium 02/27/2021 3.7  3.5 - 5.1 mmol/L Final   Chloride 02/27/2021 99  98 - 111 mmol/L Final   CO2 02/27/2021 28  22 - 32 mmol/L Final   Glucose, Bld 02/27/2021 88  70 - 99 mg/dL Final   Glucose reference range applies only to samples taken after fasting for at least 8 hours.   BUN 02/27/2021 6  6 - 20 mg/dL Final   Creatinine, Ser 02/27/2021 0.80  0.61 - 1.24 mg/dL Final   Calcium 37/48/2707 9.8  8.9 - 10.3 mg/dL Final   Total Protein 86/75/4492 7.3  6.5 - 8.1 g/dL Final   Albumin 01/00/7121 4.5  3.5 - 5.0 g/dL Final   AST 97/58/8325 22  15 - 41 U/L Final   ALT 02/27/2021 21  0 - 44 U/L Final   Alkaline Phosphatase 02/27/2021 55  38 - 126 U/L Final   Total Bilirubin 02/27/2021 1.1  0.3 - 1.2 mg/dL Final   GFR, Estimated 02/27/2021 >60  >60 mL/min Final   Comment: (NOTE) Calculated using the CKD-EPI Creatinine Equation (2021)    Anion gap 02/27/2021 10  5 - 15 Final   Performed at [facility], [address], [place], [place] [address]   Hgb A1c MFr Bld 02/27/2021 4.2 (A) 4.8 - 5.6 % Final   Comment: (NOTE) Pre diabetes:          5.7%-6.4%  Diabetes:              >6.4%  Glycemic control for   <7.0% adults with diabetes    Mean Plasma Glucose 02/27/2021 73.84  mg/dL Final   Performed at [facility], [address] [place], [place] [address]   Cholesterol 02/27/2021 208 (A) 0 - 200 mg/dL Final   Triglycerides 09/81/1914 130  <150 mg/dL Final   HDL 78/29/5621 65  >40 mg/dL Final   Total CHOL/HDL Ratio 02/27/2021 3.2  RATIO Final   VLDL 02/27/2021 26  0 - 40 mg/dL Final   LDL Cholesterol 02/27/2021 117 (A) 0 -  99 mg/dL Final   Comment:        Total Cholesterol/HDL:CHD Risk Coronary Heart Disease Risk Table                     Men   Women  1/2 Average Risk   3.4   3.3  Average Risk       5.0   4.4  2 X Average Risk   9.6   7.1  3 X Average Risk  23.4   11.0        Use the calculated Patient Ratio above and the CHD Risk Table to determine the patient's CHD Risk.        ATP III CLASSIFICATION (LDL):  <100     mg/dL   Optimal  308-657  mg/dL   Near or Above                    Optimal  130-159  mg/dL   Borderline  846-962  mg/dL   High  >952     mg/dL   Very High Performed at [facility], [address], [place], [place] [address]    TSH 02/27/2021 1.087  0.350 - 4.500 uIU/mL Final   Comment: Performed by a 3rd Generation assay with a functional sensitivity of <=0.01 uIU/mL. Performed at [facility], [address] [place], [place] [address]    POC Amphetamine UR 02/26/2021 None Detected  NONE DETECTED (Cut Off Level 1000 ng/mL) Final   POC Secobarbital (BAR) 02/26/2021 None Detected  NONE DETECTED (Cut Off Level 300 ng/mL) Final   POC Buprenorphine (BUP) 02/26/2021 None Detected  NONE DETECTED (Cut Off Level 10 ng/mL) Final   POC Oxazepam (BZO) 02/26/2021 None Detected  NONE DETECTED (Cut Off Level 300 ng/mL) Final   POC Cocaine UR 02/26/2021 Positive (A) NONE DETECTED (Cut Off Level 300 ng/mL) Final   POC Methamphetamine UR 02/26/2021 None Detected  NONE DETECTED (Cut Off Level 1000 ng/mL) Final   POC Morphine 02/26/2021 None Detected  NONE DETECTED (Cut Off Level 300 ng/mL) Final   POC Oxycodone UR 02/26/2021 None Detected  NONE DETECTED (Cut Off Level 100 ng/mL) Final   POC Methadone  UR 02/26/2021 None Detected  NONE DETECTED (Cut Off Level 300 ng/mL) Final   POC Marijuana UR 02/26/2021 Positive (A) NONE DETECTED (Cut Off Level 50 ng/mL) Final   SARSCOV2ONAVIRUS 2 AG 02/27/2021 NEGATIVE  NEGATIVE Final   Comment: (NOTE) SARS-CoV-2 antigen NOT DETECTED.   Negative  results are presumptive.  Negative results do not preclude SARS-CoV-2 infection and should not be used as the sole basis for treatment or other patient management decisions, including infection  control decisions, particularly in the presence of clinical signs and  symptoms consistent with COVID-19, or in those who have been in contact with the virus.  Negative results must be combined with clinical observations, patient history, and epidemiological information. The expected result is Negative.  Fact Sheet for Patients: https://www.jennings-kim.com/  Fact Sheet for Healthcare Providers: https://alexander-rogers.biz/  This test is not yet approved or cleared by the [place] FDA and  has been authorized for detection and/or diagnosis of SARS-CoV-2 by FDA under an Emergency Use Authorization (EUA).  This EUA will remain in effect (meaning this test can be used) for the duration of  the COV                          ID-19 declaration under Section 564(b)(1) of the Act, 21 U.S.C. section 360bbb-3(b)(1), unless the authorization is terminated or revoked sooner.    Admission on 09/09/2020, Discharged on 09/11/2020  Component Date Value Ref Range Status   Hgb A1c MFr Bld 09/10/2020 4.5 (A) 4.8 - 5.6 % Final   Comment: (NOTE) Pre diabetes:          5.7%-6.4%  Diabetes:              >6.4%  Glycemic control for   <7.0% adults with diabetes    Mean Plasma Glucose 09/10/2020 82.45  mg/dL Final   Performed at [facility], [address], [place], [place] [address]   Cholesterol 09/10/2020 165  0 - 200 mg/dL Final   Triglycerides 60/45/4098 141  <150 mg/dL Final   HDL 11/91/4782 44  >40 mg/dL Final   Total CHOL/HDL Ratio 09/10/2020 3.8  RATIO Final   VLDL 09/10/2020 28  0 - 40 mg/dL Final   LDL Cholesterol 09/10/2020 93  0 - 99 mg/dL Final   Comment:        Total Cholesterol/HDL:CHD Risk Coronary Heart Disease Risk Table                     Men    Women  1/2 Average Risk   3.4   3.3  Average Risk       5.0   4.4  2 X Average Risk   9.6   7.1  3 X Average Risk  23.4   11.0        Use the calculated Patient Ratio above and the CHD Risk Table to determine the patient's CHD Risk.        ATP III CLASSIFICATION (LDL):  <100     mg/dL   Optimal  956-213  mg/dL   Near or Above                    Optimal  130-159  mg/dL   Borderline  086-578  mg/dL   High  >469     mg/dL   Very High Performed at [facility], [address], [place], [place] [address]    TSH  09/10/2020 0.219 (A) 0.350 - 4.500 uIU/mL Final   Comment: Performed by a 3rd Generation assay with a functional sensitivity of <=0.01 uIU/mL. Performed at [facility], [address], [place], [place] [address]    SARS Coronavirus 2 by RT PCR 09/09/2020 NEGATIVE  NEGATIVE Final   Comment: (NOTE) SARS-CoV-2 target nucleic acids are NOT DETECTED.  The SARS-CoV-2 RNA is generally detectable in upper respiratory specimens during the acute phase of infection. The lowest concentration of SARS-CoV-2 viral copies this assay can detect is 138 copies/mL. A negative result does not preclude SARS-Cov-2 infection and should not be used as the sole basis for treatment or other patient management decisions. A negative result may occur with  improper specimen collection/handling, submission of specimen other than nasopharyngeal swab, presence of viral mutation(s) within the areas targeted by this assay, and inadequate number of viral copies(<138 copies/mL). A negative result must be combined with clinical observations, patient history, and epidemiological information. The expected result is Negative.  Fact Sheet for Patients:  BloggerCourse.com  Fact Sheet for Healthcare Providers:  SeriousBroker.it  This test is no                          t yet approved or cleared by the [place] FDA and   has been authorized for detection and/or diagnosis of SARS-CoV-2 by FDA under an Emergency Use Authorization (EUA). This EUA will remain  in effect (meaning this test can be used) for the duration of the COVID-19 declaration under Section 564(b)(1) of the Act, 21 U.S.C.section 360bbb-3(b)(1), unless the authorization is terminated  or revoked sooner.       Influenza A by PCR 09/09/2020 NEGATIVE  NEGATIVE Final   Influenza B by PCR 09/09/2020 NEGATIVE  NEGATIVE Final   Comment: (NOTE) The Xpert Xpress SARS-CoV-2/FLU/RSV plus assay is intended as an aid in the diagnosis of influenza from Nasopharyngeal swab specimens and should not be used as a sole basis for treatment. Nasal washings and aspirates are unacceptable for Xpert Xpress SARS-CoV-2/FLU/RSV testing.  Fact Sheet for Patients: BloggerCourse.com  Fact Sheet for Healthcare Providers: SeriousBroker.it  This test is not yet approved or cleared by the [place] FDA and has been authorized for detection and/or diagnosis of SARS-CoV-2 by FDA under an Emergency Use Authorization (EUA). This EUA will remain in effect (meaning this test can be used) for the duration of the COVID-19 declaration under Section 564(b)(1) of the Act, 21 U.S.C. section 360bbb-3(b)(1), unless the authorization is terminated or revoked.  Performed at [facility], [address] [place], [place] [address]    Opiates 09/10/2020 NONE DETECTED  NONE DETECTED Final   Cocaine 09/10/2020 NONE DETECTED  NONE DETECTED Final   Benzodiazepines 09/10/2020 NONE DETECTED  NONE DETECTED Final   Amphetamines 09/10/2020 NONE DETECTED  NONE DETECTED Final   Tetrahydrocannabinol 09/10/2020 POSITIVE (A) NONE DETECTED Final   Barbiturates 09/10/2020 NONE DETECTED  NONE DETECTED Final   Comment: (NOTE) DRUG SCREEN FOR MEDICAL PURPOSES ONLY.  IF CONFIRMATION IS NEEDED FOR ANY PURPOSE, NOTIFY  LAB WITHIN 5 DAYS.  LOWEST DETECTABLE LIMITS FOR URINE DRUG SCREEN Drug Class                     Cutoff (ng/mL) Amphetamine and metabolites    1000 Barbiturate and metabolites    200 Benzodiazepine                 200 Tricyclics  and metabolites     300 Opiates and metabolites        300 Cocaine and metabolites        300 THC                            50 Performed at [facility], [address] [place], [place] [address]    TSH 09/11/2020 0.251 (A) 0.350 - 4.500 uIU/mL Final   Comment: Performed by a 3rd Generation assay with a functional sensitivity of <=0.01 uIU/mL. Performed at [facility], [address] [place], [place] [address]    Free T4 09/11/2020 0.88  0.61 - 1.12 ng/dL Final   Comment: (NOTE) Biotin ingestion may interfere with free T4 tests. If the results are inconsistent with the TSH level, previous test results, or the clinical presentation, then consider biotin interference. If needed, order repeat testing after stopping biotin. Performed at [facility], [address], [place], [place] [address]    WBC 09/11/2020 6.3  4.0 - 10.5 K/uL Final   RBC 09/11/2020 4.62  4.22 - 5.81 MIL/uL Final   Hemoglobin 09/11/2020 13.6  13.0 - 17.0 g/dL Final   HCT 60/03/9322 41.9  39.0 - 52.0 % Final   MCV 09/11/2020 90.7  80.0 - 100.0 fL Final   MCH 09/11/2020 29.4  26.0 - 34.0 pg Final   MCHC 09/11/2020 32.5  30.0 - 36.0 g/dL Final   RDW 55/73/2202 12.7  11.5 - 15.5 % Final   Platelets 09/11/2020 226  150 - 400 K/uL Final   nRBC 09/11/2020 0.0  0.0 - 0.2 % Final   Neutrophils Relative % 09/11/2020 66  % Final   Neutro Abs 09/11/2020 4.2  1.7 - 7.7 K/uL Final   Lymphocytes Relative 09/11/2020 25  % Final   Lymphs Abs 09/11/2020 1.6  0.7 - 4.0 K/uL Final   Monocytes Relative 09/11/2020 6  % Final   Monocytes Absolute 09/11/2020 0.4  0.1 - 1.0 K/uL Final   Eosinophils Relative 09/11/2020 1  % Final   Eosinophils Absolute  09/11/2020 0.1  0.0 - 0.5 K/uL Final   Basophils Relative 09/11/2020 1  % Final   Basophils Absolute 09/11/2020 0.0  0.0 - 0.1 K/uL Final   Immature Granulocytes 09/11/2020 1  % Final   Abs Immature Granulocytes 09/11/2020 0.04  0.00 - 0.07 K/uL Final   Performed at [facility], [address], [place], [place] [address]   Sodium 09/11/2020 141  135 - 145 mmol/L Final   Potassium 09/11/2020 4.3  3.5 - 5.1 mmol/L Final   Chloride 09/11/2020 103  98 - 111 mmol/L Final   CO2 09/11/2020 30  22 - 32 mmol/L Final   Glucose, Bld 09/11/2020 83  70 - 99 mg/dL Final   Glucose reference range applies only to samples taken after fasting for at least 8 hours.   BUN 09/11/2020 9  6 - 20 mg/dL Final   Creatinine, Ser 09/11/2020 1.04  0.61 - 1.24 mg/dL Final   Calcium 62/37/6283 9.6  8.9 - 10.3 mg/dL Final   Total Protein 15/17/6160 6.8  6.5 - 8.1 g/dL Final   Albumin 73/71/0626 4.2  3.5 - 5.0 g/dL Final   AST 94/85/4627 19  15 - 41 U/L Final   ALT 09/11/2020 19  0 - 44 U/L Final   Alkaline Phosphatase 09/11/2020 53  38 - 126 U/L Final   Total  Bilirubin 09/11/2020 1.3 (A) 0.3 - 1.2 mg/dL Final   GFR, Estimated 09/11/2020 >60  >60 mL/min Final   Comment: (NOTE) Calculated using the CKD-EPI Creatinine Equation (2021)    Anion gap 09/11/2020 8  5 - 15 Final   Performed at [facility], [address], [place], [place] [address]    Allergies: Penicillins  PTA Medications: (Not in a hospital admission)   Medical Decision Making  Patient is a 30 y.o. male with past psychiatric history significant for schizoaffective disorder, bipolar type and cannabis use disorder/abuse/dependence, who presents to the [facility] behavioral health urgent care [facility]) as a voluntary direct admit transfer from [facility] Based on patient's inconsistencies regarding suicidal ideation (see HPI for details) during [facility] evaluation, and his reported worsening  auditory hallucinations, and collateral information obtained from patient's ACT team member during [facility] evaluation, including patient's ACT team members safety concerns regarding the patient, believe that the patient is a potential threat to himself at this time and recommend overnight continuous assessment for the patient.     Recommendations  Based on my evaluation the patient does not appear to have an emergency medical condition. Patient will be placed in [facility] continuous assessment for further stabilization and treatment.  Patient will be reevaluated by the treatment team on 02/27/2021 and disposition to be determined at that time.  Labs/tests ordered and reviewed:  -PCR Flu A&B, COVID: Negative  -UDS: Positive for cocaine and marijuana (Patient denied use of cocaine upon evaluation).   -CBC with differential: Within normal limits  -CMP: Within normal limits  -Hemoglobin A1c, lipid panel, and TSH ordered due to patient's history of being on antipsychotic medications.  Hemoglobin A1c reduced at 4.2%.  Lipid panel shows slight elevation of total cholesterol at 208 mg/dL (this was within normal limits at 165 mg/dL 5 months ago) and slight elevation of LDL cholesterol at 117 mg/dL (this was within normal limits at 93 mg/dL 5 months ago).  Lipid panel otherwise unremarkable.  TSH within normal limits at 1.087 uIU/mL (this was reduced at 0.251 uIU/mL 5 months ago).   -EKG ordered to check patient's QTC due to patient's history of being on antipsychotic medication.  Patient's EKG shows normal sinus rhythm with no acute/concerning findings with ventricular rate of 71 bpm, PR interval 124 ms, QRS 86 ms, and QT/QTC 382/415 ms.  Believe that patient's QT/QTC and lab values are appropriate for continuation of antipsychotic medication at this time.  Patient states that he was taking the Cogentin 2 mg p.o. at bedtime and trazodone 100 mg p.o. at bedtime as needed for sleep, the patient states that he ran out of  his Cogentin 2 weeks ago and ran out of his trazodone 1 week ago.  Patient states that he has not been taking Haldol 5 mg p.o.  Patient does state that he has been receiving the Haldol 100 mg IM injection, but Patient reports that he believes he last received this injection 6-7 weeks ago and he states he believes he is 3 weeks overdue for this injection. Per review of previous PTA med reconciliation, it was documented on 09/10/20 that patient had last received this injection 2 weeks prior to 09/10/20 at that time.  Per collateral from patient's ACT team, patient's ACT team member is not sure what patient's current psychotropic medication regimen is at this time.  Thus, it is difficult to determine what psychotropic medications the patient is actually taking at home at this time.  Recommend that dayshift treatment team/pharmacy contact patient's other ACT team members during the day on 02/27/2021 to properly reconcile patient's home psychotropic medications, as well as to properly confirm if patient is still receiving Haldol 100 mg injection/confirm when patient last had his Haldol 100 mg IM injection/when he is due for his next Haldol 100 mg IM injection  Discussed with the patient restarting the p.o. medications that patient was discharged from [facility] on in March of 2022/the medications that patient states he ran out of 1-2 weeks ago, which include Cogentin 2 mg p.o. daily at bedtime, Haldol 5 mg p.o. daily at bedtime, and trazodone 100 mg p.o. at bedtime as needed for sleep.  Patient educated on side effect profiles of Cogentin, Haldol, and trazodone and patient verbalizes understanding of this education.  Patient agreed to restart the above medications.  Will restart the following medications:  -Haldol 5 mg p.o. daily at bedtime for psychosis/schizoaffective disorder  -Cogentin 2 mg p.o. daily at bedtime for extrapyramidal reaction caused by medications  -Trazodone 100 mg p.o. daily at bedtime as needed for  sleep  Vistaril 25 mg p.o. 3 times daily as needed ordered for anxiety.  Patient educated on side effect profile of Vistaril and patient verbalizes understanding of this education.    Jaclyn Shaggy, PA-C 02/27/21  4:05 AM

## 2021-02-27 LAB — COMPREHENSIVE METABOLIC PANEL
ALT: 21 U/L (ref 0–44)
AST: 22 U/L (ref 15–41)
Albumin: 4.5 g/dL (ref 3.5–5.0)
Alkaline Phosphatase: 55 U/L (ref 38–126)
Anion gap: 10 (ref 5–15)
BUN: 6 mg/dL (ref 6–20)
CO2: 28 mmol/L (ref 22–32)
Calcium: 9.8 mg/dL (ref 8.9–10.3)
Chloride: 99 mmol/L (ref 98–111)
Creatinine, Ser: 0.8 mg/dL (ref 0.61–1.24)
GFR, Estimated: >60 mL/min (ref >60)
Glucose, Bld: 88 mg/dL (ref 70–99)
Potassium: 3.7 mmol/L (ref 3.5–5.1)
Sodium: 137 mmol/L (ref 135–145)
Total Bilirubin: 1.1 mg/dL (ref 0.3–1.2)
Total Protein: 7.3 g/dL (ref 6.5–8.1)

## 2021-02-27 LAB — CBC WITH DIFFERENTIAL/PLATELET
Abs Immature Granulocytes: 0.04 10*3/uL (ref 0.00–0.07)
Basophils Absolute: 0 10*3/uL (ref 0.0–0.1)
Basophils Relative: 0 %
Eosinophils Absolute: 0.1 10*3/uL (ref 0.0–0.5)
Eosinophils Relative: 1 %
HCT: 43.8 % (ref 39.0–52.0)
Hemoglobin: 14.4 g/dL (ref 13.0–17.0)
Immature Granulocytes: 0 %
Lymphocytes Relative: 28 %
Lymphs Abs: 2.6 10*3/uL (ref 0.7–4.0)
MCH: 29.4 pg (ref 26.0–34.0)
MCHC: 32.9 g/dL (ref 30.0–36.0)
MCV: 89.6 fL (ref 80.0–100.0)
Monocytes Absolute: 0.7 10*3/uL (ref 0.1–1.0)
Monocytes Relative: 8 %
Neutro Abs: 5.9 10*3/uL (ref 1.7–7.7)
Neutrophils Relative %: 63 %
Platelets: 232 10*3/uL (ref 150–400)
RBC: 4.89 MIL/uL (ref 4.22–5.81)
RDW: 12.5 % (ref 11.5–15.5)
WBC: 9.4 10*3/uL (ref 4.0–10.5)
nRBC: 0 % (ref 0.0–0.2)

## 2021-02-27 LAB — POC SARS CORONAVIRUS 2 AG: SARSCOV2ONAVIRUS 2 AG: NEGATIVE

## 2021-02-27 LAB — LIPID PANEL
Cholesterol: 208 mg/dL — ABNORMAL HIGH (ref 0–200)
HDL: 65 mg/dL (ref >40)
LDL Cholesterol: 117 mg/dL — ABNORMAL HIGH (ref 0–99)
Total CHOL/HDL Ratio: 3.2 RATIO
Triglycerides: 130 mg/dL (ref <150)
VLDL: 26 mg/dL (ref 0–40)

## 2021-02-27 LAB — RESP PANEL BY RT-PCR (FLU A&B, COVID) ARPGX2
Influenza A by PCR: NEGATIVE
Influenza B by PCR: NEGATIVE
SARS Coronavirus 2 by RT PCR: NEGATIVE

## 2021-02-27 LAB — POC SARS CORONAVIRUS 2 AG -  ED: SARS Coronavirus 2 Ag: NEGATIVE

## 2021-02-27 LAB — HEMOGLOBIN A1C
Hgb A1c MFr Bld: 4.2 % — ABNORMAL LOW (ref 4.8–5.6)
Mean Plasma Glucose: 73.84 mg/dL

## 2021-02-27 LAB — TSH: TSH: 1.087 u[IU]/mL (ref 0.350–4.500)

## 2021-02-27 MED ORDER — BENZTROPINE MESYLATE 2 MG PO TABS: 2.0000 mg | ORAL_TABLET | Freq: Every day | ORAL | 0 refills | Status: DC

## 2021-02-27 MED ORDER — TRAZODONE HCL 100 MG PO TABS: 100.0000 mg | ORAL_TABLET | Freq: Every evening | ORAL | 0 refills | Status: DC | PRN

## 2021-02-27 NOTE — ED Notes (Signed)
Pt sleeping at present, no distress noted.  Monitoring for safety. 

## 2021-02-27 NOTE — ED Notes (Signed)
Patient denies pain and is resting comfortably.  

## 2021-02-27 NOTE — Discharge Instructions (Signed)

## 2021-02-27 NOTE — Progress Notes (Signed)
Cody [name] received his AVS, questions answered and he retrieved his personal belongings. He was discharged without incident at 1652hrs.

## 2021-02-27 NOTE — ED Notes (Signed)
Presents with complaint of auditory hallucinations and requesting detox.  Denies SI, admits to being a cutter with razor to ease the pain x 2 weeks ago.  Denies being a suicide attempt.  A&O x 4, no distress noted.  Monitoring for safety.

## 2021-02-27 NOTE — ED Provider Notes (Signed)
FBC/OBS ASAP Discharge Summary  Date and Time: 02/27/2021 3:54 PM  Name: Cody [name]  MRN:  [id number]   Discharge Diagnoses:  Final diagnoses:  Schizoaffective disorder, bipolar type (HCC)  Cannabis use disorder, severe, dependence (HCC)    Subjective: Patient states "I just need prescriptions for my trazodone and my benztropine, I recently ran out."  He reports readiness to discharge home.  Patient is reassessed face-to-face by nurse practitioner.  He is seated in observation area, no acute distress.  He is alert and oriented, pleasant and cooperative during assessment.  He reports euthymic mood with congruent affect. He denies suicidal and homicidal ideations.  He contracts verbally for safety with this Clinical research associate. He has normal speech and behavior.  He denies both auditory and visual hallucinations.  Patient is able to converse coherently with goal-directed thoughts and no distractibility or preoccupation.  He denies paranoia.  Objectively there is no evidence of psychosis/mania or delusional thinking. Cody [name] has been diagnosed with schizoaffective disorder.  He reports he last received long-acting injectable Haldol decannulate 100 mg IM approximately 3 weeks ago. Patient resides in [place], he denies access to weapons.  He is not currently he denies alcohol and substance use.  He endorses average sleep and appetite. Patient offered support and encouragement.  He gives verbal consent to speak with his mother and act team provider, [facility]. Spoke with act team representative, Althea, with [facility]. ACT team will visit Cody [name] in his home on tomorrow, 9/16-2022. Spoke with patient's mother, Cody [name] phone number [phone].  Patient's mother will transport him home today.  She agrees with plan to discharge and follow-up with outpatient ACT team.  Stay Summary: HPI from 02/27/2021 at 23:19pm Cody [name] is a 30 y.o. male with past psychiatric history significant  for schizoaffective disorder, bipolar type and cannabis use disorder/abuse/dependence, who presents to the [facility] behavioral health urgent care [facility]) as a voluntary direct admit transfer from [facility]).  Patient presented to [facility] unaccompanied as a voluntary walk-in earlier this evening on 02/26/2021.  Patient was evaluated by TTS and myself at that time and based on patient's walk-in evaluation, it was recommended by myself for the patient to be transferred to [facility] for overnight continuous assessment for further crisis stabilization and treatment.     Total Time spent with patient: 30 minutes  Past Psychiatric History: MDD, cannabis use disorder, schizoaffective disorder Past Medical History:  Past Medical History:  Diagnosis Date   Anxiety    Mood disorder (HCC)    Schizophrenia (HCC)    No past surgical history on file. Family History: No family history on file. Family Psychiatric History: None reported Social History:  Social History   Substance and Sexual Activity  Alcohol Use Yes     Social History   Substance and Sexual Activity  Drug Use Yes   Types: Marijuana, Methamphetamines    Social History   Socioeconomic History   Marital status: Single    Spouse name: Not on file   Number of children: Not on file   Years of education: Not on file   Highest education level: Not on file  Occupational History   Not on file  Tobacco Use   Smoking status: Smoker, Current Status Unknown    Packs/day: 0.25    Types: Cigarettes   Smokeless tobacco: Never  Vaping Use   Vaping Use: Never used  Substance and Sexual Activity   Alcohol use: Yes   Drug  use: Yes    Types: Marijuana, Methamphetamines   Sexual activity: Not Currently    Birth control/protection: None  Other Topics Concern   Not on file  Social History Narrative   ** Merged History Encounter **       Social Determinants of Health   Financial Resource Strain: Not on file   Food Insecurity: Not on file  Transportation Needs: Not on file  Physical Activity: Not on file  Stress: Not on file  Social Connections: Not on file   SDOH:  SDOH Screenings   Alcohol Screen: Low Risk    Last Alcohol Screening Score (AUDIT): 3  Depression (PHQ2-9): Not on file  Financial Resource Strain: Not on file  Food Insecurity: Not on file  Housing: Not on file  Physical Activity: Not on file  Social Connections: Not on file  Stress: Not on file  Tobacco Use: High Risk   Smoking Tobacco Use: Smoker, Current Status Unknown   Smokeless Tobacco Use: Never  Transportation Needs: Not on file    Tobacco Cessation:  A prescription for an FDA-approved tobacco cessation medication was offered at discharge and the patient refused  Current Medications:  Current Facility-Administered Medications  Medication Dose Route Frequency Provider Last Rate Last Admin   acetaminophen (TYLENOL) tablet 650 mg  650 mg Oral Q6H PRN Jaclyn Shaggy, PA-C       alum & mag hydroxide-simeth (MAALOX/MYLANTA) 200-200-20 MG/5ML suspension 30 mL  30 mL Oral Q4H PRN Melbourne Abts W, PA-C       benztropine (COGENTIN) tablet 2 mg  2 mg Oral QHS Melbourne Abts W, PA-C   2 mg at 02/27/21 0041   haloperidol (HALDOL) tablet 5 mg  5 mg Oral QHS Melbourne Abts W, PA-C   5 mg at 02/27/21 0041   hydrOXYzine (ATARAX/VISTARIL) tablet 25 mg  25 mg Oral TID PRN Jaclyn Shaggy, PA-C   25 mg at 02/27/21 0041   magnesium hydroxide (MILK OF MAGNESIA) suspension 30 mL  30 mL Oral Daily PRN Melbourne Abts W, PA-C       traZODone (DESYREL) tablet 100 mg  100 mg Oral QHS PRN Melbourne Abts W, PA-C   100 mg at 02/27/21 0041   Current Outpatient Medications  Medication Sig Dispense Refill   benztropine (COGENTIN) 2 MG tablet Take 2 mg by mouth at bedtime.     haloperidol decanoate (HALDOL DECANOATE) 100 MG/ML injection Inject 100 mg into the muscle every 28 (twenty-eight) days.     traZODone (DESYREL) 100 MG tablet Take 1 tablet (100  mg total) at bedtime as needed by mouth for sleep. (Patient taking differently: Take 100 mg by mouth at bedtime.) 30 tablet 0    PTA Medications: (Not in a hospital admission)   Musculoskeletal  Strength & Muscle Tone: within normal limits Gait & Station: normal Patient leans: N/A  Psychiatric Specialty Exam  Presentation  General Appearance: Appropriate for Environment; Casual  Eye Contact:Good  Speech:Clear and Coherent; Normal Rate  Speech Volume:Normal  Handedness:Right   Mood and Affect  Mood:Euthymic  Affect:Appropriate; Congruent   Thought Process  Thought Processes:Coherent; Goal Directed; Linear  Descriptions of Associations:Intact  Orientation:Full (Time, Place and Person)  Thought Content:Logical  Diagnosis of Schizophrenia or Schizoaffective disorder in past: Yes  Duration of Psychotic Symptoms: Less than six months   Hallucinations:Hallucinations: None Description of Auditory Hallucinations: See HPI for details.  Ideas of Reference:None  Suicidal Thoughts:Suicidal Thoughts: No  Homicidal Thoughts:Homicidal Thoughts: No   Sensorium  Memory:Immediate  Fair; Recent Fair; Remote Fair  Judgment:Fair  Insight:Fair   Executive Functions  Concentration:Good  Attention Span:Good  Recall:Good  Fund of Knowledge:Good  Language:Good   Psychomotor Activity  Psychomotor Activity:Psychomotor Activity: Normal   Assets  Assets:Communication Skills; Desire for Improvement; Financial Resources/Insurance; Housing; Intimacy; Leisure Time; Physical Health; Social Support   Sleep  Sleep:Sleep: Fair Number of Hours of Sleep: 4   Nutritional Assessment (For OBS and FBC admissions only) Has the patient had a weight loss or gain of 10 pounds or more in the last 3 months?: No Has the patient had a decrease in food intake/or appetite?: No Does the patient have dental problems?: No Does the patient have eating habits or behaviors that may be  indicators of an eating disorder including binging or inducing vomiting?: No Has the patient recently lost weight without trying?: 0 Has the patient been eating poorly because of a decreased appetite?: 0 Malnutrition Screening Tool Score: 0    Physical Exam  Physical Exam Vitals and nursing note reviewed.  Constitutional:      Appearance: Normal appearance. He is well-developed and normal weight.  HENT:     Head: Normocephalic and atraumatic.     Nose: Nose normal.  Cardiovascular:     Rate and Rhythm: Normal rate.  Pulmonary:     Effort: Pulmonary effort is normal.  Musculoskeletal:        General: Normal range of motion.     Cervical back: Normal range of motion.  Skin:    General: Skin is warm and dry.  Neurological:     Mental Status: He is alert and oriented to person, place, and time.  Psychiatric:        Attention and Perception: Attention and perception normal.        Mood and Affect: Mood and affect normal.        Speech: Speech normal.        Behavior: Behavior normal. Behavior is cooperative.        Thought Content: Thought content normal.        Cognition and Memory: Cognition and memory normal.        Judgment: Judgment normal.   Review of Systems  Constitutional: Negative.   HENT: Negative.    Eyes: Negative.   Respiratory: Negative.    Cardiovascular: Negative.   Gastrointestinal: Negative.   Genitourinary: Negative.   Musculoskeletal: Negative.   Skin: Negative.   Neurological: Negative.   Endo/Heme/Allergies: Negative.   Psychiatric/Behavioral: Negative.    Blood pressure (!) 99/58, pulse 76, temperature 98 F (36.7 C), temperature source Temporal, resp. rate 16, SpO2 99 %. There is no height or weight on file to calculate BMI.  Demographic Factors:  Male  Loss Factors: NA  Historical Factors: NA  Risk Reduction Factors:   Sense of responsibility to family, Positive social support, Positive therapeutic relationship, and Positive coping  skills or problem solving skills  Continued Clinical Symptoms:  Alcohol/Substance Abuse/Dependencies Previous Psychiatric Diagnoses and Treatments  Cognitive Features That Contribute To Risk:  None    Suicide Risk:  Minimal: No identifiable suicidal ideation.  Patients presenting with no risk factors but with morbid ruminations; may be classified as minimal risk based on the severity of the depressive symptoms  Plan Of Care/Follow-up recommendations:  Patient reviewed with Dr. Bronwen Betters. Follow-up with established outpatient psychiatry, [facility] act team. Continue current medications including: -Benztropine 2 mg nightly -Haldol decanoate 100 mg q. 28 days -Trazodone 100 mg nightly as needed/sleep  Disposition: Discharge  Lenard Lance, FNP 02/27/2021, 3:54 PM

## 2021-02-27 NOTE — Progress Notes (Signed)
Received Cody [name] this AM asleep in his chair bed, he was cooperative with VS assessment and later woke up and ate lunch.

## 2021-05-22 ENCOUNTER — Ambulatory Visit (HOSPITAL_COMMUNITY)
Admission: EM | Admit: 2021-05-22 | Discharge: 2021-05-23 | Disposition: A | Discharge status: Home / Self Care | Payer: Medicaid Other | Attending: Behavioral Health | Admitting: Behavioral Health

## 2021-05-22 DIAGNOSIS — Z20822 Contact with and (suspected) exposure to covid-19: Secondary | ICD-10-CM | POA: Insufficient documentation

## 2021-05-22 DIAGNOSIS — F1290 Cannabis use, unspecified, uncomplicated: Secondary | ICD-10-CM | POA: Insufficient documentation

## 2021-05-22 DIAGNOSIS — R45851 Suicidal ideations: Secondary | ICD-10-CM | POA: Insufficient documentation

## 2021-05-22 DIAGNOSIS — F333 Major depressive disorder, recurrent, severe with psychotic symptoms: Secondary | ICD-10-CM | POA: Diagnosis not present

## 2021-05-22 DIAGNOSIS — F17210 Nicotine dependence, cigarettes, uncomplicated: Secondary | ICD-10-CM | POA: Diagnosis not present

## 2021-05-22 DIAGNOSIS — F259 Schizoaffective disorder, unspecified: Secondary | ICD-10-CM

## 2021-05-22 LAB — COMPREHENSIVE METABOLIC PANEL
ALT: 241 U/L — ABNORMAL HIGH (ref 0–44)
AST: 47 U/L — ABNORMAL HIGH (ref 15–41)
Albumin: 4.1 g/dL (ref 3.5–5.0)
Alkaline Phosphatase: 60 U/L (ref 38–126)
Anion gap: 8 (ref 5–15)
BUN: 10 mg/dL (ref 6–20)
CO2: 28 mmol/L (ref 22–32)
Calcium: 10 mg/dL (ref 8.9–10.3)
Chloride: 99 mmol/L (ref 98–111)
Creatinine, Ser: 1 mg/dL (ref 0.61–1.24)
GFR, Estimated: >60 mL/min (ref >60)
Glucose, Bld: 73 mg/dL (ref 70–99)
Potassium: 3.8 mmol/L (ref 3.5–5.1)
Sodium: 135 mmol/L (ref 135–145)
Total Bilirubin: 1.4 mg/dL — ABNORMAL HIGH (ref 0.3–1.2)
Total Protein: 6.4 g/dL — ABNORMAL LOW (ref 6.5–8.1)

## 2021-05-22 LAB — CBC WITH DIFFERENTIAL/PLATELET
Abs Immature Granulocytes: 0.05 10*3/uL (ref 0.00–0.07)
Basophils Absolute: 0 10*3/uL (ref 0.0–0.1)
Basophils Relative: 0 %
Eosinophils Absolute: 0.1 10*3/uL (ref 0.0–0.5)
Eosinophils Relative: 1 %
HCT: 43.7 % (ref 39.0–52.0)
Hemoglobin: 13.9 g/dL (ref 13.0–17.0)
Immature Granulocytes: 1 %
Lymphocytes Relative: 27 %
Lymphs Abs: 2.7 10*3/uL (ref 0.7–4.0)
MCH: 28.9 pg (ref 26.0–34.0)
MCHC: 31.8 g/dL (ref 30.0–36.0)
MCV: 90.9 fL (ref 80.0–100.0)
Monocytes Absolute: 1 10*3/uL (ref 0.1–1.0)
Monocytes Relative: 10 %
Neutro Abs: 5.9 10*3/uL (ref 1.7–7.7)
Neutrophils Relative %: 61 %
Platelets: 248 10*3/uL (ref 150–400)
RBC: 4.81 MIL/uL (ref 4.22–5.81)
RDW: 13 % (ref 11.5–15.5)
WBC: 9.7 10*3/uL (ref 4.0–10.5)
nRBC: 0 % (ref 0.0–0.2)

## 2021-05-22 LAB — RESP PANEL BY RT-PCR (FLU A&B, COVID) ARPGX2
Influenza A by PCR: NEGATIVE
Influenza B by PCR: NEGATIVE
SARS Coronavirus 2 by RT PCR: NEGATIVE

## 2021-05-22 LAB — POC SARS CORONAVIRUS 2 AG: SARSCOV2ONAVIRUS 2 AG: NEGATIVE

## 2021-05-22 LAB — ETHANOL: Alcohol, Ethyl (B): <10 mg/dL (ref <10)

## 2021-05-22 MED ORDER — ALUM & MAG HYDROXIDE-SIMETH 200-200-20 MG/5ML PO SUSP: 30.0000 mL | ORAL | Status: DC | PRN

## 2021-05-22 MED ORDER — ACETAMINOPHEN 325 MG PO TABS: 650.0000 mg | ORAL_TABLET | Freq: Four times a day (QID) | ORAL | Status: DC | PRN

## 2021-05-22 MED ORDER — HYDROXYZINE HCL 25 MG PO TABS
25.0000 mg | ORAL_TABLET | Freq: Three times a day (TID) | ORAL | Status: DC | PRN
  Administered 2021-05-22: 25 mg via ORAL
  Filled 2021-05-22: qty 1

## 2021-05-22 MED ORDER — MAGNESIUM HYDROXIDE 400 MG/5ML PO SUSP: 30.0000 mL | Freq: Every day | ORAL | Status: DC | PRN

## 2021-05-22 MED ORDER — ENSURE ENLIVE PO LIQD
237.0000 mL | Freq: Two times a day (BID) | ORAL | Status: DC
  Filled 2021-05-22: qty 237

## 2021-05-22 MED ORDER — TRAZODONE HCL 50 MG PO TABS
50.0000 mg | ORAL_TABLET | Freq: Every evening | ORAL | Status: DC | PRN
Start: 2021-05-22 — End: 2021-05-23
  Administered 2021-05-22: 50 mg via ORAL
  Filled 2021-05-22: qty 1
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
## 2021-05-22 NOTE — ED Provider Notes (Signed)
Behavioral Health Admission H&P [facility] & OBS)  Date: 05/22/21 Patient Name: Cody [name] MRN: McRoberts:[id number] Chief Complaint:  Chief Complaint  Patient presents with   Schizophrenia   Suicidal   Depression   Hallucinations      Diagnoses:  Final diagnoses:  MDD (major depressive disorder), recurrent, severe, with psychosis ([place])    HPI: Patient presents to the [facility] voluntarily as a walk-in accompanied by Janice Norrie (ACT team with [facility]) with a chief complaint of depression and suicidal ideations.  Patient seen and evaluated face-to-face by this provider, chart reviewed and case discussed with Dr. Serafina Mitchell.   On evaluation, patient is alert and oriented x4. His thought process is logical and speech is coherent. His mood is depressed and affect is congruent.     Patient reports that he is in a crisis and that his mother passed away two weeks ago. He reports feeling depressed and describes his depressive symptoms as not eating, sadness, irritability, poor sleep, and decreased energy for the past two weeks. He denies having thoughts of wanting to kill himself or others. He endorses command auditory hallucinations that tell him to do things like "not eat." He denies visual hallucinations. There is no objective evidence that he is responding to internal or external stimuli.   He reports smoking weed daily, on average 2 blunts per day since he was in middle school. 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 A couple times this month." He denies drinking alcohol. He reports that he resides alone in a duplex. He reports that he receives outpatient psychiatric services with [facility]. He states that he is prescribed Haldol injection, trazodone and cogentin.   Patient gives verbal consent for this provider to speak with Janice Norrie at [facility]. I spoke with Mr. Owens Shark via telephone [phone] who states that he brought the patient  in for an evaluation after the patient called the office emotional, stating that he was suicidal. He states that the patient reported feeling like he is in someone else's body. He state that the patient's mother died unexpectedly at a young age two weeks ago around Thanksgiving. He states that he is concerned that the patient may hurt himself due to voicing suicidal thoughts. He states that this is unusual behavior for the patient, however he was very close to his mother. He states that the patient has a hx of delusions and hallucinations. He states that the patient receives medication management at [facility] but is unable to provide the name of his current medications at this time.  PHQ 2-9:   [place] [facility] from 02/26/2021 in [facility] Admission (Discharged) from OP Visit from 09/09/2020 in [facility]  C-SSRS RISK CATEGORY High Risk Low Risk        Total Time spent with patient: 30 minutes  Musculoskeletal  Strength & Muscle Tone: within normal limits Gait & Station: normal Patient leans: N/A  Psychiatric Specialty Exam  Presentation General Appearance: Disheveled  Eye Contact:Fair  Speech:Clear and Coherent  Speech Volume:Decreased  Handedness:Right   Mood and Affect  Mood:Depressed  Affect:Congruent   Thought Process  Thought Processes:Coherent  Descriptions of Associations:Intact  Orientation:Full (Time, Place and Person)  Thought Content:WDL  Diagnosis of Schizophrenia or Schizoaffective disorder in past: Yes  Duration of Psychotic Symptoms: Greater than six months  Hallucinations:Hallucinations: Auditory; Command  Ideas of Reference:None  Suicidal Thoughts:Suicidal Thoughts: No  Homicidal Thoughts:Homicidal Thoughts: No   Sensorium  Memory:Immediate Fair; Recent Fair; Remote Fair  Judgment:Intact  Insight:Present   Executive Functions  Concentration:Fair  Attention  Span:Fair  [place]   Psychomotor Activity  Psychomotor Activity:Psychomotor Activity: Normal   Assets  Assets:Communication Skills; Desire for Improvement; Financial Resources/Insurance; Housing; Leisure Time; Physical Health; Social Support   Sleep  Sleep:Sleep: Poor Number of Hours of Sleep: 4   Nutritional Assessment (For OBS and FBC admissions only) Has the patient had a weight loss or gain of 10 pounds or more in the last 3 months?: No Has the patient had a decrease in food intake/or appetite?: Yes Does the patient have dental problems?: No Does the patient have eating habits or behaviors that may be indicators of an eating disorder including binging or inducing vomiting?: No Has the patient recently lost weight without trying?: 2.0 Has the patient been eating poorly because of a decreased appetite?: 1 Malnutrition Screening Tool Score: 3 Nutritional Assessment Referrals: Other (comment) (nutritional supplement ordered)   Physical Exam Constitutional:      Appearance: Normal appearance.  HENT:     Head: Normocephalic.     Nose: Nose normal.  Eyes:     Conjunctiva/sclera: Conjunctivae normal.  Cardiovascular:     Rate and Rhythm: Normal rate.     Pulses: Normal pulses.  Pulmonary:     Effort: Pulmonary effort is normal.  Musculoskeletal:        General: Normal range of motion.     Cervical back: Normal range of motion.  Skin:    Comments: Abrasions to left lower face area  Neurological:     Mental Status: He is oriented to person, place, and time.   Review of Systems  Constitutional: Negative.   HENT: Negative.    Eyes: Negative.   Respiratory: Negative.    Cardiovascular: Negative.   Gastrointestinal: Negative.   Genitourinary: Negative.   Musculoskeletal: Negative.   Neurological: Negative.   Endo/Heme/Allergies: Negative.    Blood pressure 123/84, pulse 96, temperature 98.2 F (36.8 C), temperature  source Oral, resp. rate 18, SpO2 100 %. There is no height or weight on file to calculate BMI.  Past Psychiatric History: hx of schizoaffective dx, hospitalizations at [facility] in March 2022 and May 2016  Is the patient at risk to self? Yes  Has the patient been a risk to self in the past 6 months? Yes .    Has the patient been a risk to self within the distant past? Yes   Is the patient a risk to others? No   Has the patient been a risk to others in the past 6 months? No   Has the patient been a risk to others within the distant past? No   Past Medical History:  Past Medical History:  Diagnosis Date   Anxiety    Mood disorder ([place])    Schizophrenia ([place])    No past surgical history on file.  Family History: Unknown   Social History:  Social History   Socioeconomic History   Marital status: Single    Spouse name: Not on file   Number of children: Not on file   Years of education: Not on file   Highest education level: Not on file  Occupational History   Not on file  Tobacco Use   Smoking status: Smoker, Current Status Unknown    Packs/day: 0.25    Types: Cigarettes   Smokeless tobacco: Never  Vaping Use  Vaping Use: Never used  Substance and Sexual Activity   Alcohol use: Yes   Drug use: Yes    Types: Marijuana, Methamphetamines   Sexual activity: Not Currently    Birth control/protection: None  Other Topics Concern   Not on file  Social History Narrative   ** Merged History Encounter **       Social Determinants of Health   Financial Resource Strain: Not on file  Food Insecurity: Not on file  Transportation Needs: Not on file  Physical Activity: Not on file  Stress: Not on file  Social Connections: Not on file  Intimate Partner Violence: Not on file    SDOH:  SDOH Screenings   Alcohol Screen: Low Risk    Last Alcohol Screening Score (AUDIT): 3  Depression (PHQ2-9): Not on file  Financial Resource Strain: Not on file  Food Insecurity: Not on file   Housing: Not on file  Physical Activity: Not on file  Social Connections: Not on file  Stress: Not on file  Tobacco Use: High Risk   Smoking Tobacco Use: Smoker, Current Status Unknown   Smokeless Tobacco Use: Never   Passive Exposure: Not on file  Transportation Needs: Not on file    Last Labs:  Admission on 02/26/2021, Discharged on 02/27/2021  Component Date Value Ref Range Status   SARS Coronavirus 2 by RT PCR 02/27/2021 NEGATIVE  NEGATIVE Final   Comment: (NOTE) SARS-CoV-2 target nucleic acids are NOT DETECTED.  The SARS-CoV-2 RNA is generally detectable in upper respiratory specimens during the acute phase of infection. The lowest concentration of SARS-CoV-2 viral copies this assay can detect is 138 copies/mL. A negative result does not preclude SARS-Cov-2 infection and should not be used as the sole basis for treatment or other patient management decisions. A negative result may occur with  improper specimen collection/handling, submission of specimen other than nasopharyngeal swab, presence of viral mutation(s) within the areas targeted by this assay, and inadequate number of viral copies(<138 copies/mL). A negative result must be combined with clinical observations, patient history, and epidemiological information. The expected result is Negative.  Fact Sheet for Patients:  BloggerCourse.com  Fact Sheet for Healthcare Providers:  SeriousBroker.it  This test is no                          t yet approved or cleared by the [place] FDA and  has been authorized for detection and/or diagnosis of SARS-CoV-2 by FDA under an Emergency Use Authorization (EUA). This EUA will remain  in effect (meaning this test can be used) for the duration of the COVID-19 declaration under Section 564(b)(1) of the Act, 21 U.S.C.section 360bbb-3(b)(1), unless the authorization is terminated  or revoked sooner.       Influenza A by  PCR 02/27/2021 NEGATIVE  NEGATIVE Final   Influenza B by PCR 02/27/2021 NEGATIVE  NEGATIVE Final   Comment: (NOTE) The Xpert Xpress SARS-CoV-2/FLU/RSV plus assay is intended as an aid in the diagnosis of influenza from Nasopharyngeal swab specimens and should not be used as a sole basis for treatment. Nasal washings and aspirates are unacceptable for Xpert Xpress SARS-CoV-2/FLU/RSV testing.  Fact Sheet for Patients: BloggerCourse.com  Fact Sheet for Healthcare Providers: SeriousBroker.it  This test is not yet approved or cleared by the [place] FDA and has been authorized for detection and/or diagnosis of SARS-CoV-2 by FDA under an Emergency Use Authorization (EUA). This EUA will remain in effect (meaning this test can  be used) for the duration of the COVID-19 declaration under Section 564(b)(1) of the Act, 21 U.S.C. section 360bbb-3(b)(1), unless the authorization is terminated or revoked.  Performed at [facility], [address] [place], [place] [address]    SARS Coronavirus 2 Ag 02/27/2021 Negative  Negative Preliminary   WBC 02/27/2021 9.4  4.0 - 10.5 K/uL Final   RBC 02/27/2021 4.89  4.22 - 5.81 MIL/uL Final   Hemoglobin 02/27/2021 14.4  13.0 - 17.0 g/dL Final   HCT 60/45/409809/15/2022 43.8  39.0 - 52.0 % Final   MCV 02/27/2021 89.6  80.0 - 100.0 fL Final   MCH 02/27/2021 29.4  26.0 - 34.0 pg Final   MCHC 02/27/2021 32.9  30.0 - 36.0 g/dL Final   RDW 11/91/478209/15/2022 12.5  11.5 - 15.5 % Final   Platelets 02/27/2021 232  150 - 400 K/uL Final   nRBC 02/27/2021 0.0  0.0 - 0.2 % Final   Neutrophils Relative % 02/27/2021 63  % Final   Neutro Abs 02/27/2021 5.9  1.7 - 7.7 K/uL Final   Lymphocytes Relative 02/27/2021 28  % Final   Lymphs Abs 02/27/2021 2.6  0.7 - 4.0 K/uL Final   Monocytes Relative 02/27/2021 8  % Final   Monocytes Absolute 02/27/2021 0.7  0.1 - 1.0 K/uL Final   Eosinophils Relative 02/27/2021 1  % Final    Eosinophils Absolute 02/27/2021 0.1  0.0 - 0.5 K/uL Final   Basophils Relative 02/27/2021 0  % Final   Basophils Absolute 02/27/2021 0.0  0.0 - 0.1 K/uL Final   Immature Granulocytes 02/27/2021 0  % Final   Abs Immature Granulocytes 02/27/2021 0.04  0.00 - 0.07 K/uL Final   Performed at [facility], [address] [place], [place] [address]   Sodium 02/27/2021 137  135 - 145 mmol/L Final   Potassium 02/27/2021 3.7  3.5 - 5.1 mmol/L Final   Chloride 02/27/2021 99  98 - 111 mmol/L Final   CO2 02/27/2021 28  22 - 32 mmol/L Final   Glucose, Bld 02/27/2021 88  70 - 99 mg/dL Final   Glucose reference range applies only to samples taken after fasting for at least 8 hours.   BUN 02/27/2021 6  6 - 20 mg/dL Final   Creatinine, Ser 02/27/2021 0.80  0.61 - 1.24 mg/dL Final   Calcium 30/86/578409/15/2022 9.8  8.9 - 10.3 mg/dL Final   Total Protein 69/62/952809/15/2022 7.3  6.5 - 8.1 g/dL Final   Albumin 41/32/440109/15/2022 4.5  3.5 - 5.0 g/dL Final   AST 02/72/536609/15/2022 22  15 - 41 U/L Final   ALT 02/27/2021 21  0 - 44 U/L Final   Alkaline Phosphatase 02/27/2021 55  38 - 126 U/L Final   Total Bilirubin 02/27/2021 1.1  0.3 - 1.2 mg/dL Final   GFR, Estimated 02/27/2021 >60  >60 mL/min Final   Comment: (NOTE) Calculated using the CKD-EPI Creatinine Equation (2021)    Anion gap 02/27/2021 10  5 - 15 Final   Performed at [facility], [address] [place], [place] [address]   Hgb A1c MFr Bld 02/27/2021 4.2 (L)  4.8 - 5.6 % Final   Comment: (NOTE) Pre diabetes:          5.7%-6.4%  Diabetes:              >6.4%  Glycemic control for   <7.0% adults with diabetes    Mean Plasma Glucose 02/27/2021 73.84  mg/dL Final  Performed at [facility], [place] [address], [place], [place] [address]   Cholesterol 02/27/2021 208 (H)  0 - 200 mg/dL Final   Triglycerides 02/27/2021 130  <150 mg/dL Final   HDL 02/27/2021 65  >40 mg/dL Final   Total CHOL/HDL Ratio 02/27/2021 3.2  RATIO Final   VLDL 02/27/2021 26  0 - 40 mg/dL  Final   LDL Cholesterol 02/27/2021 117 (H)  0 - 99 mg/dL Final   Comment:        Total Cholesterol/HDL:CHD Risk Coronary Heart Disease Risk Table                     Men   Women  1/2 Average Risk   3.4   3.3  Average Risk       5.0   4.4  2 X Average Risk   9.6   7.1  3 X Average Risk  23.4   11.0        Use the calculated Patient Ratio above and the CHD Risk Table to determine the patient's CHD Risk.        ATP III CLASSIFICATION (LDL):  <100     mg/dL   Optimal  100-129  mg/dL   Near or Above                    Optimal  130-159  mg/dL   Borderline  160-189  mg/dL   High  >190     mg/dL   Very High Performed at [facility] [address] [place], [place] [address]    TSH 02/27/2021 1.087  0.350 - 4.500 uIU/mL Final   Comment: Performed by a 3rd Generation assay with a functional sensitivity of <=0.01 uIU/mL. Performed at [facility], [place] [address] [place], [place] [address]    POC Amphetamine UR 02/26/2021 None Detected  NONE DETECTED (Cut Off Level 1000 ng/mL) Final   POC Secobarbital (BAR) 02/26/2021 None Detected  NONE DETECTED (Cut Off Level 300 ng/mL) Final   POC Buprenorphine (BUP) 02/26/2021 None Detected  NONE DETECTED (Cut Off Level 10 ng/mL) Final   POC Oxazepam (BZO) 02/26/2021 None Detected  NONE DETECTED (Cut Off Level 300 ng/mL) Final   POC Cocaine UR 02/26/2021 Positive (A)  NONE DETECTED (Cut Off Level 300 ng/mL) Final   POC Methamphetamine UR 02/26/2021 None Detected  NONE DETECTED (Cut Off Level 1000 ng/mL) Final   POC Morphine 02/26/2021 None Detected  NONE DETECTED (Cut Off Level 300 ng/mL) Final   POC Oxycodone UR 02/26/2021 None Detected  NONE DETECTED (Cut Off Level 100 ng/mL) Final   POC Methadone UR 02/26/2021 None Detected  NONE DETECTED (Cut Off Level 300 ng/mL) Final   POC Marijuana UR 02/26/2021 Positive (A)  NONE DETECTED (Cut Off Level 50 ng/mL) Final   SARSCOV2ONAVIRUS 2 AG 02/27/2021 NEGATIVE  NEGATIVE Final   Comment:  (NOTE) SARS-CoV-2 antigen NOT DETECTED.   Negative results are presumptive.  Negative results do not preclude SARS-CoV-2 infection and should not be used as the sole basis for treatment or other patient management decisions, including infection  control decisions, particularly in the presence of clinical signs and  symptoms consistent with COVID-19, or in those who have been in contact with the virus.  Negative results must be combined with clinical observations, patient history, and epidemiological information. The expected result is Negative.  Fact Sheet for Patients: HandmadeRecipes.com.cy  Fact Sheet for Healthcare Providers: FuneralLife.at  This test  is not yet approved or cleared by the [place] and  has been authorized for detection and/or diagnosis of SARS-CoV-2 by FDA under an Emergency Use Authorization (EUA).  This EUA will remain in effect (meaning this test can be used) for the duration of  the COV                          ID-19 declaration under Section 564(b)(1) of the Act, 21 U.S.C. section 360bbb-3(b)(1), unless the authorization is terminated or revoked sooner.      Allergies: Penicillins  PTA Medications: (Not in a hospital admission)   Medical Decision Making  Patient admitted to the [facility] continuous assessment for safety and mood stabilization.  Lab Orders         Resp Panel by RT-PCR (Flu A&B, Covid) Nasopharyngeal Swab         CBC with Differential/Platelet         Comprehensive metabolic panel         Ethanol         POCT Urine Drug Screen - ([facility])         POC SARS Coronavirus 2 Ag-ED - Nasal Swab     Meds ordered this encounter  Medications   acetaminophen (TYLENOL) tablet 650 mg   alum & mag hydroxide-simeth (MAALOX/MYLANTA) 200-200-20 MG/5ML suspension 30 mL   magnesium hydroxide (MILK OF MAGNESIA) suspension 30 mL   hydrOXYzine (ATARAX) tablet 25 mg   traZODone (DESYREL) tablet 50  mg   feeding supplement (ENSURE ENLIVE / ENSURE PLUS) liquid 237 mL    Recommendations  Based on my evaluation the patient does not appear to have an emergency medical condition.  Marissa Calamity, NP 05/22/21  5:31 PM

## 2021-05-22 NOTE — ED Notes (Signed)
Pt sleeping@this time. Breathing even and unlabored. Will continue to monitor for safety 

## 2021-05-22 NOTE — Progress Notes (Signed)
Received Tavoris in the OBS unit after the admission assessment. He immediately asked for nourishments and his request was granted. He paced for a short period of time before he settled to his chair bed. He denied feeling suicidal and homicidal at the present time. He denied visual and auditory hallucinations at this time.

## 2021-05-22 NOTE — ED Notes (Signed)
[facility] lab courier called to transport labs to [facility] lab 

## 2021-05-22 NOTE — ED Notes (Signed)
Pt A& O x4. Pt calm and cooperative. No c/o pain or distress. Will continue to monitor for safety
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
## 2021-05-23 MED ORDER — BACITRACIN ZINC 500 UNIT/GM EX OINT
1.0000 "application " | TOPICAL_OINTMENT | Freq: Once | CUTANEOUS | Status: AC
  Administered 2021-05-23: 1 via TOPICAL
  Filled 2021-05-23: qty 28.35

## 2021-05-23 MED ORDER — BACITRACIN-NEOMYCIN-POLYMYXIN 400-5-5000 EX OINT: 1.0000 "application " | TOPICAL_OINTMENT | Freq: Two times a day (BID) | CUTANEOUS | Status: DC

## 2021-05-23 NOTE — Discharge Instructions (Addendum)
Discharge recommendations:  Follow up with [facility] Act Team for outpatient medication management and therapy.  Please follow up with your primary care provider for all medical related needs.  Therapy: We recommend that patient participate in individual therapy to address grief   Medications: The parent/guardian is to contact a medical professional and/or outpatient provider to address any new side effects that develop. Parent/guardian should update outpatient providers of any new medications and/or medication changes.   Atypical antipsychotics: If you are prescribed an atypical antipsychotic, it is recommended that your height, weight, BMI, blood pressure, fasting lipid panel, and fasting blood sugar be monitored by your outpatient providers.  Safety:  The patient should abstain from use of illicit substances/drugs and abuse of any medications. If symptoms worsen or do not continue to improve or if the patient becomes actively suicidal or homicidal then it is recommended that the patient return to the closest hospital emergency department, the [facility], or call 911 for further evaluation and treatment. National Suicide Prevention Lifeline 1-800-SUICIDE or 865-374-0582.  About 988 988 offers 24/7 access to trained crisis counselors who can help people experiencing mental health-related distress. People can call or text 988 or chat 988lifeline.org for themselves or if they are worried about a loved one who may need crisis support.

## 2021-05-23 NOTE — ED Notes (Signed)
Pt currently denies HI, SI, and visual hallucinations.  Pt did state he believes he as a microchip in his brain that tries to tell him things but he is unsure of what is being said.  Breakfast provided.  No pain or discomfort noted or voiced at this time.  Breathing is even and unlabored.  Will continue to monitor for safety.

## 2021-05-23 NOTE — ED Provider Notes (Signed)
FBC/OBS ASAP Discharge Summary  Date and Time: 05/23/2021 8:30 AM  Name: Cody [name]  MRN:  [id number]   Discharge Diagnoses:  Final diagnoses:  MDD (major depressive disorder), recurrent, severe, with psychosis ([place])    Subjective:  Patient presents to the [facility] voluntarily as a walk-in accompanied by Janice Norrie (ACT team with [facility]) with a chief complaint of depression and suicidal ideations.  Stay Summary: Patient seen and reevaluated face-to-face by this provider, chart reviewed and case discussed with Dr. Serafina Mitchell. On evaluation, patient is alert and oriented x4. His thought process is logical and speech is clear and coherent. His mood is dysphoric and affect is congruent.  He states that he feels a lot better today after sleeping last night. He reports having a hard time processing his emotions yesterday after his mother's wake. He reports that his mother's funeral is today. He states that his mother lived with him and her death was too painful for him to process. He states that yesterday he felt like he would be better off not being here but he was not having thoughts of wanting to kill himself. He denies having suicidal ideations today. He denies homicidal ideations. He denies currently hearing voices or seeing things that other people cannot hear or see. There is no evidence that the patient is responding to internal or external stimuli. He reports a fair appetite. He denies having any weapons including guns in his home. He states that he follows up with the ACT team twice a month. He states that he feels safe returning home and asked this provider can contact his father Bronte Dewater [phone]. He states that when he feelings depressed he can call his dad who is supportive and lives in the area.  With the patient's verbal consent this provider contacted Mr. Perdomo via telephone. Mr. Maresca states that he has no safety concerns with  the patient discharging home today. He states that the patient's mother funeral is today. He states that the patient does live alone and as far as he knows he does not have any weapons including guns in his home. Mr. Chung states that he is already here at the [facility] to pick up [facility].   This provider spoke with Constance Holster at [facility] ACT team [phone] via telephone. Constance Holster states that she is willing to pick up the patient from the facility. She states that she is worried he may not have transportation to his mother's funeral today. She states that [facility] have two therapist and that she is not sure they offer greif counseling but will work on setting him up with therapy.  She states that she will look into [facility] the [facility]. She states that the Act team will follow-up with the patient on Monday.  Total Time spent with patient: 20 minutes  Past Psychiatric History: hx of schizoaffective dx, hospitalizations at [facility] in March 2022 and May 2016 Past Medical History:  Past Medical History:  Diagnosis Date   Anxiety    Mood disorder ([place])    Schizophrenia ([place])    No past surgical history on file. Family History: No family history on file. Family Psychiatric History: Family hx unknown  Social History:  Social History   Substance and Sexual Activity  Alcohol Use Yes     Social History   Substance and Sexual Activity  Drug Use Yes   Types: Marijuana, Methamphetamines  Social History   Socioeconomic History   Marital status: Single    Spouse name: Not on file   Number of children: Not on file   Years of education: Not on file   Highest education level: Not on file  Occupational History   Not on file  Tobacco Use   Smoking status: Smoker, Current Status Unknown    Packs/day: 0.25    Types: Cigarettes   Smokeless tobacco: Never  Vaping Use   Vaping Use: Never used  Substance and Sexual Activity   Alcohol use: Yes   Drug  use: Yes    Types: Marijuana, Methamphetamines   Sexual activity: Not Currently    Birth control/protection: None  Other Topics Concern   Not on file  Social History Narrative   ** Merged History Encounter **       Social Determinants of Health   Financial Resource Strain: Not on file  Food Insecurity: Not on file  Transportation Needs: Not on file  Physical Activity: Not on file  Stress: Not on file  Social Connections: Not on file   SDOH:  SDOH Screenings   Alcohol Screen: Low Risk    Last Alcohol Screening Score (AUDIT): 3  Depression (PHQ2-9): Medium Risk   PHQ-2 Score: 13  Financial Resource Strain: Not on file  Food Insecurity: Not on file  Housing: Not on file  Physical Activity: Not on file  Social Connections: Not on file  Stress: Not on file  Tobacco Use: High Risk   Smoking Tobacco Use: Smoker, Current Status Unknown   Smokeless Tobacco Use: Never   Passive Exposure: Not on file  Transportation Needs: Not on file    Tobacco Cessation:  Prescription not provided because: pt declined   Current Medications:  Current Facility-Administered Medications  Medication Dose Route Frequency Provider Last Rate Last Admin   acetaminophen (TYLENOL) tablet 650 mg  650 mg Oral Q6H PRN Gailene Youkhana L, NP       alum & mag hydroxide-simeth (MAALOX/MYLANTA) 200-200-20 MG/5ML suspension 30 mL  30 mL Oral Q4H PRN Kensy Blizard L, NP       feeding supplement (ENSURE ENLIVE / ENSURE PLUS) liquid 237 mL  237 mL Oral BID BM Rosalinda Seaman L, NP       hydrOXYzine (ATARAX) tablet 25 mg  25 mg Oral TID PRN Amelio Brosky L, NP   25 mg at 05/22/21 1814   magnesium hydroxide (MILK OF MAGNESIA) suspension 30 mL  30 mL Oral Daily PRN Davontay Watlington L, NP       neomycin-bacitracin-polymyxin (NEOSPORIN) ointment packet 1 application  1 application Topical BID Zakirah Weingart L, NP       traZODone (DESYREL) tablet 50 mg  50 mg Oral QHS PRN Dariel Pellecchia L, NP   50 mg at 05/22/21 2015    Current Outpatient Medications  Medication Sig Dispense Refill   benztropine (COGENTIN) 2 MG tablet Take 1 tablet (2 mg total) by mouth at bedtime. 30 tablet 0   haloperidol decanoate (HALDOL DECANOATE) 100 MG/ML injection Inject 100 mg into the muscle every 28 (twenty-eight) days.     traZODone (DESYREL) 100 MG tablet Take 1 tablet (100 mg total) by mouth at bedtime as needed for sleep. 30 tablet 0    PTA Medications: (Not in a hospital admission)   Musculoskeletal  Strength & Muscle Tone: within normal limits Gait & Station: normal Patient leans: N/A  Psychiatric Specialty Exam  Presentation  General Appearance: Appropriate for Environment  Eye  Contact:Fair  Speech:Clear and Coherent  Speech Volume:Decreased  Handedness:Right   Mood and Affect  Mood:Dysphoric  Affect:Congruent   Thought Process  Thought Processes:Coherent  Descriptions of Associations:Intact  Orientation:Full (Time, Place and Person)  Thought Content:WDL  Diagnosis of Schizophrenia or Schizoaffective disorder in past: Yes  Duration of Psychotic Symptoms: Greater than six months   Hallucinations:Hallucinations: None  Ideas of Reference:None  Suicidal Thoughts:Suicidal Thoughts: No  Homicidal Thoughts:Homicidal Thoughts: No   Sensorium  Memory:Immediate Fair; Remote Fair; Recent Fair  Judgment:Fair  Insight:Fair   Executive Functions  Concentration:Fair  Attention Span:Fair  Recall:Fair  Fund of Knowledge:Fair  Language:Fair   Psychomotor Activity  Psychomotor Activity:Psychomotor Activity: Normal   Assets  Assets:Communication Skills; Desire for Improvement; Financial Resources/Insurance; Housing; Leisure Time; Physical Health; Social Support   Sleep  Sleep:Sleep: Fair Number of Hours of Sleep: 6   Nutritional Assessment (For OBS and FBC admissions only) Has the patient had a weight loss or gain of 10 pounds or more in the last 3 months?: No Has the patient  had a decrease in food intake/or appetite?: Yes Does the patient have dental problems?: No Does the patient have eating habits or behaviors that may be indicators of an eating disorder including binging or inducing vomiting?: No Has the patient recently lost weight without trying?: 2.0 Has the patient been eating poorly because of a decreased appetite?: 1 Malnutrition Screening Tool Score: 3 Nutritional Assessment Referrals: Other (comment) (nutritional supplement ordered)    Physical Exam  Physical Exam Constitutional:      Appearance: Normal appearance.  HENT:     Head: Normocephalic and atraumatic.     Nose: Nose normal.  Eyes:     Conjunctiva/sclera: Conjunctivae normal.  Cardiovascular:     Rate and Rhythm: Normal rate.  Pulmonary:     Effort: Pulmonary effort is normal.  Musculoskeletal:        General: Normal range of motion.     Cervical back: Normal range of motion.  Neurological:     Mental Status: He is alert and oriented to person, place, and time.   Review of Systems  Constitutional: Negative.   Eyes: Negative.   Respiratory: Negative.    Cardiovascular: Negative.   Gastrointestinal: Negative.   Genitourinary: Negative.   Musculoskeletal: Negative.   Skin:        Scrapes to face are itching   Blood pressure 114/82, pulse 98, temperature 98.6 F (37 C), temperature source Oral, resp. rate 18, SpO2 100 %. There is no height or weight on file to calculate BMI.  Demographic Factors:  Male and Living alone  Loss Factors: Loss of significant relationship  Historical Factors: NA  Risk Reduction Factors:   Sense of responsibility to family, Religious beliefs about death, and Positive social support  Continued Clinical Symptoms:  Previous Psychiatric Diagnoses and Treatments  Cognitive Features That Contribute To Risk:  None    Suicide Risk:  Minimal: No identifiable suicidal ideation.  Patients presenting with no risk factors but with morbid  ruminations; may be classified as minimal risk based on the severity of the depressive symptoms  Plan Of Care/Follow-up recommendations:  Activity:  as tolerated  Disposition: Discharge to home.   Discharge recommendations:  Follow up with [facility] Act Team for outpatient medication management and therapy.  Please follow up with your primary care provider for all medical related needs.   Therapy: We recommend that patient participate in individual therapy to address grief   Medications: The parent/guardian is  to contact a medical professional and/or outpatient provider to address any new side effects that develop. Parent/guardian should update outpatient providers of any new medications and/or medication changes.   Atypical antipsychotics: If you are prescribed an atypical antipsychotic, it is recommended that your height, weight, BMI, blood pressure, fasting lipid panel, and fasting blood sugar be monitored by your outpatient providers.  Safety:  The patient should abstain from use of illicit substances/drugs and abuse of any medications. If symptoms worsen or do not continue to improve or if the patient becomes actively suicidal or homicidal then it is recommended that the patient return to the closest hospital emergency department, the [facility], or call 911 for further evaluation and treatment. National Suicide Prevention Lifeline 1-800-SUICIDE or 5877800880.  About 988 988 offers 24/7 access to trained crisis counselors who can help people experiencing mental health-related distress. People can call or text 988 or chat 988lifeline.org for themselves or if they are worried about a loved one who may need crisis support.    Kalanie Fewell L, NP 05/23/2021, 8:30 AM

## 2021-05-23 NOTE — ED Notes (Signed)
Pt received breakfast 

## 2021-05-23 NOTE — ED Notes (Signed)
Pt calm and cooperative. No c/o pain or distress. Will continue to monitor for safety 

## 2021-05-23 NOTE — ED Notes (Signed)
Pt discharged with  AVS.  AVS reviewed prior to discharge. Father present at time of discharge to transport pt home. Pt alert, oriented, and ambulatory.  Safety maintained.

## 2021-05-31 ENCOUNTER — Telehealth (HOSPITAL_COMMUNITY): Payer: Self-pay

## 2021-05-31 NOTE — BH Assessment (Signed)
Care Management [facility] Discharge Follow Up   Writer attempted to make contact with patient today and was unsuccessful.  Patient does not have a phone number listed in epic.   Per chart review, patient will follow up with [facility] (ACTT Team).

## 2022-01-13 ENCOUNTER — Ambulatory Visit (HOSPITAL_COMMUNITY)
Admission: EM | Admit: 2022-01-13 | Discharge: 2022-01-14 | Disposition: A | Discharge status: Home / Self Care | Payer: Medicaid Other | Attending: Psychiatry | Admitting: Psychiatry

## 2022-01-13 DIAGNOSIS — Z5900 Homelessness unspecified: Secondary | ICD-10-CM | POA: Insufficient documentation

## 2022-01-13 DIAGNOSIS — F1990 Other psychoactive substance use, unspecified, uncomplicated: Secondary | ICD-10-CM | POA: Insufficient documentation

## 2022-01-13 DIAGNOSIS — Z20822 Contact with and (suspected) exposure to covid-19: Secondary | ICD-10-CM | POA: Insufficient documentation

## 2022-01-13 DIAGNOSIS — Z79899 Other long term (current) drug therapy: Secondary | ICD-10-CM | POA: Insufficient documentation

## 2022-01-13 DIAGNOSIS — F17210 Nicotine dependence, cigarettes, uncomplicated: Secondary | ICD-10-CM | POA: Insufficient documentation

## 2022-01-13 DIAGNOSIS — F323 Major depressive disorder, single episode, severe with psychotic features: Secondary | ICD-10-CM

## 2022-01-13 DIAGNOSIS — F250 Schizoaffective disorder, bipolar type: Secondary | ICD-10-CM | POA: Insufficient documentation

## 2022-01-13 DIAGNOSIS — F209 Schizophrenia, unspecified: Secondary | ICD-10-CM

## 2022-01-13 DIAGNOSIS — R443 Hallucinations, unspecified: Secondary | ICD-10-CM

## 2022-01-13 LAB — COMPREHENSIVE METABOLIC PANEL
ALT: 24 U/L (ref 0–44)
AST: 31 U/L (ref 15–41)
Albumin: 4.1 g/dL (ref 3.5–5.0)
Alkaline Phosphatase: 57 U/L (ref 38–126)
Anion gap: 6 (ref 5–15)
BUN: 9 mg/dL (ref 6–20)
CO2: 26 mmol/L (ref 22–32)
Calcium: 9.4 mg/dL (ref 8.9–10.3)
Chloride: 105 mmol/L (ref 98–111)
Creatinine, Ser: 0.99 mg/dL (ref 0.61–1.24)
GFR, Estimated: >60 mL/min (ref >60)
Glucose, Bld: 79 mg/dL (ref 70–99)
Potassium: 3.9 mmol/L (ref 3.5–5.1)
Sodium: 137 mmol/L (ref 135–145)
Total Bilirubin: 1.4 mg/dL — ABNORMAL HIGH (ref 0.3–1.2)
Total Protein: 6.4 g/dL — ABNORMAL LOW (ref 6.5–8.1)

## 2022-01-13 LAB — POCT URINE DRUG SCREEN - MANUAL ENTRY (I-SCREEN)
POC Amphetamine UR: NOT DETECTED
POC Buprenorphine (BUP): NOT DETECTED
POC Cocaine UR: NOT DETECTED
POC Marijuana UR: POSITIVE — AB
POC Methadone UR: NOT DETECTED
POC Methamphetamine UR: NOT DETECTED
POC Morphine: NOT DETECTED
POC Oxazepam (BZO): NOT DETECTED
POC Oxycodone UR: NOT DETECTED
POC Secobarbital (BAR): NOT DETECTED

## 2022-01-13 LAB — CBC WITH DIFFERENTIAL/PLATELET
Abs Immature Granulocytes: 0.02 10*3/uL (ref 0.00–0.07)
Basophils Absolute: 0 10*3/uL (ref 0.0–0.1)
Basophils Relative: 1 %
Eosinophils Absolute: 0.1 10*3/uL (ref 0.0–0.5)
Eosinophils Relative: 1 %
HCT: 41.1 % (ref 39.0–52.0)
Hemoglobin: 13.6 g/dL (ref 13.0–17.0)
Immature Granulocytes: 0 %
Lymphocytes Relative: 24 %
Lymphs Abs: 2 10*3/uL (ref 0.7–4.0)
MCH: 29.2 pg (ref 26.0–34.0)
MCHC: 33.1 g/dL (ref 30.0–36.0)
MCV: 88.2 fL (ref 80.0–100.0)
Monocytes Absolute: 0.8 10*3/uL (ref 0.1–1.0)
Monocytes Relative: 9 %
Neutro Abs: 5.6 10*3/uL (ref 1.7–7.7)
Neutrophils Relative %: 65 %
Platelets: 204 10*3/uL (ref 150–400)
RBC: 4.66 MIL/uL (ref 4.22–5.81)
RDW: 12.8 % (ref 11.5–15.5)
WBC: 8.5 10*3/uL (ref 4.0–10.5)
nRBC: 0 % (ref 0.0–0.2)

## 2022-01-13 LAB — RESP PANEL BY RT-PCR (FLU A&B, COVID) ARPGX2
Influenza A by PCR: NEGATIVE
Influenza B by PCR: NEGATIVE
SARS Coronavirus 2 by RT PCR: NEGATIVE

## 2022-01-13 LAB — LIPID PANEL
Cholesterol: 190 mg/dL (ref 0–200)
HDL: 71 mg/dL (ref >40)
LDL Cholesterol: 113 mg/dL — ABNORMAL HIGH (ref 0–99)
Total CHOL/HDL Ratio: 2.7 RATIO
Triglycerides: 28 mg/dL (ref <150)
VLDL: 6 mg/dL (ref 0–40)

## 2022-01-13 LAB — ETHANOL: Alcohol, Ethyl (B): <10 mg/dL (ref <10)

## 2022-01-13 LAB — POC SARS CORONAVIRUS 2 AG: SARSCOV2ONAVIRUS 2 AG: NEGATIVE

## 2022-01-13 LAB — TSH: TSH: 0.112 u[IU]/mL — ABNORMAL LOW (ref 0.350–4.500)

## 2022-01-13 LAB — HEMOGLOBIN A1C
Hgb A1c MFr Bld: 4.2 % — ABNORMAL LOW (ref 4.8–5.6)
Mean Plasma Glucose: 73.84 mg/dL

## 2022-01-13 MED ORDER — MAGNESIUM HYDROXIDE 400 MG/5ML PO SUSP: 30.0000 mL | Freq: Every day | ORAL | Status: DC | PRN

## 2022-01-13 MED ORDER — NICOTINE 21 MG/24HR TD PT24
21.0000 mg | MEDICATED_PATCH | Freq: Once | TRANSDERMAL | Status: DC
  Administered 2022-01-13: 21 mg via TRANSDERMAL
  Filled 2022-01-13: qty 1

## 2022-01-13 MED ORDER — HYDROXYZINE HCL 25 MG PO TABS: 25.0000 mg | ORAL_TABLET | Freq: Three times a day (TID) | ORAL | Status: DC | PRN

## 2022-01-13 MED ORDER — OLANZAPINE 5 MG PO TABS
5.0000 mg | ORAL_TABLET | Freq: Every day | ORAL | Status: DC
  Administered 2022-01-13: 5 mg via ORAL
  Filled 2022-01-13: qty 1

## 2022-01-13 MED ORDER — ACETAMINOPHEN 325 MG PO TABS: 650.0000 mg | ORAL_TABLET | Freq: Four times a day (QID) | ORAL | Status: DC | PRN

## 2022-01-13 MED ORDER — BENZTROPINE MESYLATE 1 MG PO TABS
2.0000 mg | ORAL_TABLET | Freq: Every day | ORAL | Status: DC
  Administered 2022-01-13: 2 mg via ORAL
  Filled 2022-01-13: qty 2

## 2022-01-13 MED ORDER — TRAZODONE HCL 50 MG PO TABS: 50.0000 mg | ORAL_TABLET | Freq: Every evening | ORAL | Status: DC | PRN

## 2022-01-13 MED ORDER — ALUM & MAG HYDROXIDE-SIMETH 200-200-20 MG/5ML PO SUSP: 30.0000 mL | ORAL | Status: DC | PRN

## 2022-01-13 NOTE — ED Notes (Signed)
Pt is in the bed sleeping. Respirations are even and unlabored. No acute distress noted. Will continue to monitor for safety. 

## 2022-01-13 NOTE — ED Notes (Signed)
Patient to Bed 4 - denies SI and HI endorses AVH but did not want to talk much and quickly laid down and went to sleep
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
## 2022-01-13 NOTE — ED Provider Notes (Signed)
[facility] Continuous Assessment Admission H&P  Date: 01/13/22 Patient Name: Cody [name] MRN: [id number] Chief Complaint: "I'm hearing voices and seeing flashes of light daily".  Chief Complaint  Patient presents with   Hallucinations      Diagnoses:  Final diagnoses:  Hallucinations  Homelessness  Schizoaffective disorder, bipolar type (HCC)  Schizophrenia, unspecified type (HCC)  MDD (major depressive disorder), single episode, severe with psychosis (HCC)    HPI: Cody [name] is a 31 y.o. male with past psychiatric history significant for schizoaffective disorder, bipolar type and cannabis use disorder/abuse/dependence, MDD severe, recurrent, with psychosis, and auditory hallucinations who presents to the [facility] behavioral health urgent care [facility]) as a voluntary walk-in after he was dropped off by his friend, with complaints of command auditory hallucinations, visual hallucinations, and homelessness.   Pt is calm and attentive upon presentation. Pt endorses command auditory hallucinations and states " I'm hearing voices saying to harm myself, and hearing people hurting themselves in my body". "I hear the voices daily, and I also see flashes of light probably like all the time". "I'm just basically having difficulty with life and situations".   Pt denies SI and HI. Pt reports he last took his psychiatric medications two weeks ago. Pt reports he gets monthly Haldol injections, and got the last shot two weeks ago at [facility], he also reports taking Klonopin and Trazodone. Pt reports he gets his medications and sees a provider at [facility] (ACTT Team). Pt reports he used to see a therapist in the past, but not anymore. Pt reports he is currently homeless "I have nowhere to stay"  Pt endorses use of illicit substances such as cocaine, last used 3 days ago, Methamphetamine, last used 6 days ago, daily Marijuana use, Heroin, last used a couple of months ago,  and 2 packs of cigarettes daily. Pt also reports alcohol use "about 3-6 bottles weekly". Pt reports he snorts and injects heroin and cocaine at times.  Pt denies access or means to firearms or other weapons. Pt reports his sleep and appetite as poor.  Support, encouragement and reassurance provided about ongoing stressors. Pt provided with opportunity for questions.   On evaluation, patient is alert, oriented x 3, and cooperative. Speech is clear and coherent. Pt appears disheveled. Eye contact bizarre/staring. Mood is euthymic, affect is restricted. Thought process and thought content is coherent. Pt denies SI and HI. Pt endorses command auditory hallucinations and visual hallucinations. There is no indication that the patient is responding to internal stimuli. No delusions elicited during this assessment.      PHQ 2-9:  Flowsheet Row ED from 01/13/2022 in [facility] ED from 05/22/2021 in [facility]  Thoughts that you would be better off dead, or of hurting yourself in some way Not at all More than half the days  PHQ-9 Total Score 6 13       Flowsheet Row ED from 01/13/2022 in [facility] ED from 05/22/2021 in [facility] ED from 02/26/2021 in [facility]  C-SSRS RISK CATEGORY No Risk No Risk High Risk        Total Time spent with patient: 20 minutes  Musculoskeletal  Strength & Muscle Tone: within normal limits Gait & Station: normal Patient leans: N/A  Psychiatric Specialty Exam  Presentation General Appearance: Disheveled  Eye Contact:Other (comment) (Staring)  Speech:Clear and Coherent  Speech Volume:Normal  Handedness:Right  Mood and Affect  Mood:Euthymic  Affect:Restricted   Thought Process  Thought Processes:Linear  Descriptions of Associations:Circumstantial  Orientation:Partial  Thought Content:WDL  Diagnosis of  Schizophrenia or Schizoaffective disorder in past: Yes  Duration of Psychotic Symptoms: Greater than six months  Hallucinations:Hallucinations: Auditory; Visual Description of Auditory Hallucinations: Voices in his body telling  him to hurt himself daily. Description of Visual Hallucinations: Visions of flashes of light daily  Ideas of Reference:None  Suicidal Thoughts:Suicidal Thoughts: No  Homicidal Thoughts:Homicidal Thoughts: No   Sensorium  Memory:Immediate Fair; Recent Fair  Judgment:Poor  Insight:Present   Executive Functions  Concentration:Fair  Attention Span:Fair  Recall:Fair  Fund of Knowledge:Fair  Language:Fair   Psychomotor Activity  Psychomotor Activity:Psychomotor Activity: Normal   Assets  Assets:Communication Skills; Desire for Improvement; Social Support   Sleep  Sleep:Sleep: Poor   Nutritional Assessment (For OBS and FBC admissions only) Has the patient had a weight loss or gain of 10 pounds or more in the last 3 months?: No Has the patient had a decrease in food intake/or appetite?: No Does the patient have dental problems?: No Does the patient have eating habits or behaviors that may be indicators of an eating disorder including binging or inducing vomiting?: No Has the patient recently lost weight without trying?: 0 Has the patient been eating poorly because of a decreased appetite?: 0 Malnutrition Screening Tool Score: 0    Physical Exam Constitutional:      General: He is not in acute distress.    Appearance: He is not diaphoretic.  HENT:     Head: Normocephalic.     Right Ear: External ear normal.     Left Ear: External ear normal.     Nose: No congestion.  Eyes:     General:        Right eye: No discharge.        Left eye: No discharge.  Cardiovascular:     Rate and Rhythm: Normal rate.  Pulmonary:     Effort: No respiratory distress.  Chest:     Chest wall: No tenderness.  Neurological:     Mental Status: He is  alert. Mental status is at baseline.  Psychiatric:        Attention and Perception: He perceives auditory and visual hallucinations.        Mood and Affect: Affect is inappropriate.        Speech: Speech normal.        Behavior: Behavior is cooperative.        Thought Content: Thought content is not paranoid or delusional. Thought content does not include homicidal or suicidal ideation. Thought content does not include homicidal or suicidal plan.        Cognition and Memory: Cognition is impaired.        Judgment: Judgment is inappropriate.    Review of Systems  Constitutional:  Negative for diaphoresis, fever and weight loss.  HENT:  Negative for congestion.   Eyes:  Negative for discharge.  Respiratory:  Negative for cough, shortness of breath and wheezing.   Cardiovascular:  Negative for chest pain and palpitations.  Gastrointestinal:  Negative for diarrhea, nausea and vomiting.  Neurological:  Negative for dizziness, seizures, loss of consciousness, weakness and headaches.  Psychiatric/Behavioral:  Positive for hallucinations and substance abuse. Negative for depression and suicidal ideas.     Blood pressure 137/73, pulse 83, temperature 98.1 F (36.7 C), temperature source Oral, resp. rate 18, SpO2 100 %. There is no height or weight on  file to calculate BMI.  Past Psychiatric History:  "Patient presents to the ED for evaluation of auditory hallucinations 08/27/19.  Patient has a history of mental health problems.  He has history of schizophrenia and bipolar disorder.  Patient states he has been having increasing for hallucinations.  The voices are telling him to do a number of things.  Denies that there is specifically telling him to hurt himself else".  Is the patient at risk to self? Yes  Has the patient been a risk to self in the past 6 months? Yes .    Has the patient been a risk to self within the distant past? Yes   Is the patient a risk to others? No   Has the patient been  a risk to others in the past 6 months? No   Has the patient been a risk to others within the distant past? No   Past Medical History:  Past Medical History:  Diagnosis Date   Anxiety    Mood disorder (HCC)    Schizophrenia (HCC)    No past surgical history on file.  Family History: No family history on file.  Social History:  Social History   Socioeconomic History   Marital status: Single    Spouse name: Not on file   Number of children: Not on file   Years of education: Not on file   Highest education level: Not on file  Occupational History   Not on file  Tobacco Use   Smoking status: Smoker, Current Status Unknown    Packs/day: 0.25    Types: Cigarettes   Smokeless tobacco: Never  Vaping Use   Vaping Use: Never used  Substance and Sexual Activity   Alcohol use: Yes   Drug use: Yes    Types: Marijuana, Methamphetamines   Sexual activity: Not Currently    Birth control/protection: None  Other Topics Concern   Not on file  Social History Narrative   ** Merged History Encounter **       Social Determinants of Health   Financial Resource Strain: Not on file  Food Insecurity: Not on file  Transportation Needs: Not on file  Physical Activity: Not on file  Stress: Not on file  Social Connections: Not on file  Intimate Partner Violence: Not on file    SDOH:  SDOH Screenings   Alcohol Screen: Low Risk  (09/09/2020)   Alcohol Screen    Last Alcohol Screening Score (AUDIT): 3  Depression (PHQ2-9): Medium Risk (01/13/2022)   Depression (PHQ2-9)    PHQ-2 Score: 6  Financial Resource Strain: Not on file  Food Insecurity: Not on file  Housing: Not on file  Physical Activity: Not on file  Social Connections: Not on file  Stress: Not on file  Tobacco Use: High Risk (09/09/2020)   Patient History    Smoking Tobacco Use: Smoker, Current Status Unknown    Smokeless Tobacco Use: Never    Passive Exposure: Not on file  Transportation Needs: Not on file    Last  Labs:  Admission on 01/13/2022  Component Date Value Ref Range Status   WBC 01/13/2022 8.5  4.0 - 10.5 K/uL Final   RBC 01/13/2022 4.66  4.22 - 5.81 MIL/uL Final   Hemoglobin 01/13/2022 13.6  13.0 - 17.0 g/dL Final   HCT 73/71/0626 41.1  39.0 - 52.0 % Final   MCV 01/13/2022 88.2  80.0 - 100.0 fL Final   MCH 01/13/2022 29.2  26.0 - 34.0 pg Final  MCHC 01/13/2022 33.1  30.0 - 36.0 g/dL Final   RDW 16/96/7893 12.8  11.5 - 15.5 % Final   Platelets 01/13/2022 204  150 - 400 K/uL Final   nRBC 01/13/2022 0.0  0.0 - 0.2 % Final   Neutrophils Relative % 01/13/2022 65  % Final   Neutro Abs 01/13/2022 5.6  1.7 - 7.7 K/uL Final   Lymphocytes Relative 01/13/2022 24  % Final   Lymphs Abs 01/13/2022 2.0  0.7 - 4.0 K/uL Final   Monocytes Relative 01/13/2022 9  % Final   Monocytes Absolute 01/13/2022 0.8  0.1 - 1.0 K/uL Final   Eosinophils Relative 01/13/2022 1  % Final   Eosinophils Absolute 01/13/2022 0.1  0.0 - 0.5 K/uL Final   Basophils Relative 01/13/2022 1  % Final   Basophils Absolute 01/13/2022 0.0  0.0 - 0.1 K/uL Final   Immature Granulocytes 01/13/2022 0  % Final   Abs Immature Granulocytes 01/13/2022 0.02  0.00 - 0.07 K/uL Final   Performed at [facility], [address] [place], [place] [address]   POC Amphetamine UR 01/13/2022 None Detected  NONE DETECTED (Cut Off Level 1000 ng/mL) Final   POC Secobarbital (BAR) 01/13/2022 None Detected  NONE DETECTED (Cut Off Level 300 ng/mL) Final   POC Buprenorphine (BUP) 01/13/2022 None Detected  NONE DETECTED (Cut Off Level 10 ng/mL) Final   POC Oxazepam (BZO) 01/13/2022 None Detected  NONE DETECTED (Cut Off Level 300 ng/mL) Final   POC Cocaine UR 01/13/2022 None Detected  NONE DETECTED (Cut Off Level 300 ng/mL) Final   POC Methamphetamine UR 01/13/2022 None Detected  NONE DETECTED (Cut Off Level 1000 ng/mL) Final   POC Morphine 01/13/2022 None Detected  NONE DETECTED (Cut Off Level 300 ng/mL) Final   POC Methadone UR 01/13/2022 None  Detected  NONE DETECTED (Cut Off Level 300 ng/mL) Final   POC Oxycodone UR 01/13/2022 None Detected  NONE DETECTED (Cut Off Level 100 ng/mL) Final   POC Marijuana UR 01/13/2022 Positive (A)  NONE DETECTED (Cut Off Level 50 ng/mL) Final   SARSCOV2ONAVIRUS 2 AG 01/13/2022 NEGATIVE  NEGATIVE Final   Comment: (NOTE) SARS-CoV-2 antigen NOT DETECTED.   Negative results are presumptive.  Negative results do not preclude SARS-CoV-2 infection and should not be used as the sole basis for treatment or other patient management decisions, including infection  control decisions, particularly in the presence of clinical signs and  symptoms consistent with COVID-19, or in those who have been in contact with the virus.  Negative results must be combined with clinical observations, patient history, and epidemiological information. The expected result is Negative.  Fact Sheet for Patients: https://www.jennings-kim.com/  Fact Sheet for Healthcare Providers: https://alexander-rogers.biz/  This test is not yet approved or cleared by the [place] FDA and  has been authorized for detection and/or diagnosis of SARS-CoV-2 by FDA under an Emergency Use Authorization (EUA).  This EUA will remain in effect (meaning this test can be used) for the duration of  the COV                          ID-19 declaration under Section 564(b)(1) of the Act, 21 U.S.C. section 360bbb-3(b)(1), unless the authorization is terminated or revoked sooner.      Allergies: Penicillins  PTA Medications: (Not in a hospital admission)  Prior to Admission medications   Medication Sig Start Date End Date Taking? Authorizing Provider  benztropine (COGENTIN) 2 MG tablet Take  1 tablet (2 mg total) by mouth at bedtime. 02/27/21   Lenard LanceAllen, Tina L, FNP  haloperidol decanoate (HALDOL DECANOATE) 100 MG/ML injection Inject 100 mg into the muscle every 28 (twenty-eight) days.    [provider]  traZODone  (DESYREL) 100 MG tablet Take 1 tablet (100 mg total) by mouth at bedtime as needed for sleep. 02/27/21   Lenard LanceAllen, Tina L, FNP    Medical Decision Making  Admit to continuous assessment unit for crisis stabilization, safety monitoring and medication management due to command auditory hallucinations and visual hallucinations. Pt denies SI and HI. Pt does not meet inpatient criteria at this time or IVC criteria.  Will maintain ongoing assessment of appropriate level of care pending re-eval in am.  Pt agrees with plan for overnight admission observation, and is amenable to discharge and follow up with outpatient psychiatric services with [facility] (ACTT Team).   Routine labs ordered Lab Orders         Resp Panel by RT-PCR (Flu A&B, Covid) Anterior Nasal Swab         CBC with Differential/Platelet         Comprehensive metabolic panel         Hemoglobin A1c         TSH         Hepatitis panel, acute         Lipid panel         Ethanol         POCT Urine Drug Screen - (I-Screen)         POC SARS Coronavirus 2 Ag      EKG  Meds restarted this encounter -Cogentin 2 mg Po Qhs for drug-induced EPS -Olanzapine 5 mg tablet PO Qhs schizophrenia -Nicoderm CQ 24 hours, 21 mg Transdermal for Patch nicotine dependence  Other prn's -Tylenol 650 mg PO Q6h prn pain -Maalox 30 ml PO Q4h prn indigestion -Atarax 25 mg tablet PO TID prn anxiety -Milk of magnesia 30 ml PO daily mild constipation -Trazodone 50 mg PO Qhs prn sleep    Recommendations  Based on my evaluation the patient does not appear to have an emergency medical condition.   Recommend admit to continuous observations unit overnight.  Re-eval for AVH in am.   Mancel Balehinwendu V Rance Smithson, NP 01/13/22  10:43 PM

## 2022-01-14 LAB — HEPATITIS PANEL, ACUTE
HCV Ab: NONREACTIVE
Hep A IgM: NONREACTIVE
Hep B C IgM: NONREACTIVE
Hepatitis B Surface Ag: NONREACTIVE

## 2022-01-14 NOTE — Progress Notes (Signed)
Patient denies SI and HI. Patient cooperative and calm on unit, mostly sleeping. Patient informed of ACT team member coming at discharge to take patient home.

## 2022-01-14 NOTE — ED Provider Notes (Signed)
FBC/OBS ASAP Discharge Summary  Date and Time: 01/14/2022 9:57 AM  Name: Cody [name]  MRN:  [id number]   Discharge Diagnoses:  Final diagnoses:  Hallucinations  Homelessness  Schizoaffective disorder, bipolar type ([place])  Schizophrenia, unspecified type ([place])  MDD (major depressive disorder), single episode, severe with psychosis ([place])    Subjective: Patient states "I am ready to go."  Shravan is reassessed, face-to-face, by nurse practitioner.  He is reclined in observation area upon my approach, appears asleep.  He is easily awakened.  He is alert and oriented, pleasant and cooperative.  Patient has been diagnosed with major depressive disorder as well as schizoaffective disorder.  He is followed by outpatient psychiatry through [facility] act team.  He is compliant with medication including Haldol decannulate.  He endorses history of multiple previous inpatient psychiatric hospitalizations.  Selah continues to deny suicidal or homicidal ideations today.  He easily contracts verbally for safety with this Probation officer.  He reports average sleep and appetite while at [facility] behavioral health.  He denies auditory and visual hallucinations currently.  There is no evidence of delusional thought content and no indication that patient is responding to internal stimuli.  He shares that recent stressors include homelessness.  He is working with ACT team to secure safe housing.  He is not currently employed.  He denies alcohol and substance use today.  Patient offered support and encouragement.  He gives verbal consent to speak with [facility] act team.  Spoke with Baird Cancer, ACT team member, phone number [phone].  She agrees with plan for discharge today.  Patient last met with ACT team on yesterday at act team [facility] office.  They report patient is at baseline, typically meets with ACT team every other day.   Current plan includes ACT team member, Jenny Reichmann, who is known to  [place], to pick up patient at the [facility] today and transport to shelter.  Currently transitions of living housing team at [facility] actively seeking housing on Hubert's behalf.  Stay Summary: Cody [name] HPI from 01/13/2022 at 2122pm : Cody [name] is a 31 y.o. male with past psychiatric history significant for schizoaffective disorder, bipolar type and cannabis use disorder/abuse/dependence, MDD severe, recurrent, with psychosis, and auditory hallucinations who presents to the [facility] behavioral health urgent care [facility]) as a voluntary walk-in after he was dropped off by his friend, with complaints of command auditory hallucinations, visual hallucinations, and homelessness.    Pt is calm and attentive upon presentation. Pt endorses command auditory hallucinations and states " I'm hearing voices saying to harm myself, and hearing people hurting themselves in my body". "I hear the voices daily, and I also see flashes of light probably like all the time". "I'm just basically having difficulty with life and situations".    Pt denies SI and HI. Pt reports he last took his psychiatric medications two weeks ago. Pt reports he gets monthly Haldol injections, and got the last shot two weeks ago at [facility], he also reports taking Klonopin and Trazodone. Pt reports he gets his medications and sees a provider at [facility] (ACTT Team). Pt reports he used to see a therapist in the past, but not anymore. Pt reports he is currently homeless "I have nowhere to stay"   Pt endorses use of illicit substances such as cocaine, last used 3 days ago, Methamphetamine, last used 6 days ago, daily Marijuana use, Heroin, last used a couple of months ago, and  2 packs of cigarettes daily. Pt also reports alcohol use "about 3-6 bottles weekly". Pt reports he snorts and injects heroin and cocaine at times.  Pt denies access or means to firearms or other weapons. Pt reports his sleep and  appetite as poor.  Support, encouragement and reassurance provided about ongoing stressors. Pt provided with opportunity for questions.    On evaluation, patient is alert, oriented x 3, and cooperative. Speech is clear and coherent. Pt appears disheveled. Eye contact bizarre/staring. Mood is euthymic, affect is restricted. Thought process and thought content is coherent. Pt denies SI and HI. Pt endorses command auditory hallucinations and visual hallucinations. There is no indication that the patient is responding to internal stimuli. No delusions elicited during this assessment.       Total Time spent with patient: 30 minutes  Past Psychiatric History: Schizoaffective disorder, bipolar type, schizophrenia, major depressive disorder Past Medical History:  Past Medical History:  Diagnosis Date   Anxiety    Mood disorder ([place])    Schizophrenia ([place])    No past surgical history on file. Family History: No family history on file. Family Psychiatric History: None reported Social History:  Social History   Substance and Sexual Activity  Alcohol Use Yes     Social History   Substance and Sexual Activity  Drug Use Yes   Types: Marijuana, Methamphetamines    Social History   Socioeconomic History   Marital status: Single    Spouse name: Not on file   Number of children: Not on file   Years of education: Not on file   Highest education level: Not on file  Occupational History   Not on file  Tobacco Use   Smoking status: Smoker, Current Status Unknown    Packs/day: 0.25    Types: Cigarettes   Smokeless tobacco: Never  Vaping Use   Vaping Use: Never used  Substance and Sexual Activity   Alcohol use: Yes   Drug use: Yes    Types: Marijuana, Methamphetamines   Sexual activity: Not Currently    Birth control/protection: None  Other Topics Concern   Not on file  Social History Narrative   ** Merged History Encounter **       Social Determinants of Health   Financial  Resource Strain: Not on file  Food Insecurity: Not on file  Transportation Needs: Not on file  Physical Activity: Not on file  Stress: Not on file  Social Connections: Not on file   SDOH:  SDOH Screenings   Alcohol Screen: Low Risk  (09/09/2020)   Alcohol Screen    Last Alcohol Screening Score (AUDIT): 3  Depression (PHQ2-9): Medium Risk (01/13/2022)   Depression (PHQ2-9)    PHQ-2 Score: 6  Financial Resource Strain: Not on file  Food Insecurity: Not on file  Housing: Not on file  Physical Activity: Not on file  Social Connections: Not on file  Stress: Not on file  Tobacco Use: High Risk (09/09/2020)   Patient History    Smoking Tobacco Use: Smoker, Current Status Unknown    Smokeless Tobacco Use: Never    Passive Exposure: Not on file  Transportation Needs: Not on file    Tobacco Cessation:  A prescription for an FDA-approved tobacco cessation medication was offered at discharge and the patient refused  Current Medications:  Current Facility-Administered Medications  Medication Dose Route Frequency Provider Last Rate Last Admin   acetaminophen (TYLENOL) tablet 650 mg  650 mg Oral Q6H PRN Onuoha, Chinwendu V, NP  alum & mag hydroxide-simeth (MAALOX/MYLANTA) 200-200-20 MG/5ML suspension 30 mL  30 mL Oral Q4H PRN Onuoha, Chinwendu V, NP       benztropine (COGENTIN) tablet 2 mg  2 mg Oral QHS Onuoha, Chinwendu V, NP   2 mg at 01/13/22 2227   hydrOXYzine (ATARAX) tablet 25 mg  25 mg Oral TID PRN Onuoha, Chinwendu V, NP       magnesium hydroxide (MILK OF MAGNESIA) suspension 30 mL  30 mL Oral Daily PRN Onuoha, Chinwendu V, NP       nicotine (NICODERM CQ - dosed in mg/24 hours) patch 21 mg  21 mg Transdermal Once Onuoha, Chinwendu V, NP   21 mg at 01/13/22 2229   OLANZapine (ZYPREXA) tablet 5 mg  5 mg Oral QHS Onuoha, Chinwendu V, NP   5 mg at 01/13/22 2227   traZODone (DESYREL) tablet 50 mg  50 mg Oral QHS PRN Onuoha, Chinwendu V, NP       Current Outpatient Medications   Medication Sig Dispense Refill   benztropine (COGENTIN) 2 MG tablet Take 1 tablet (2 mg total) by mouth at bedtime. 30 tablet 0   haloperidol decanoate (HALDOL DECANOATE) 100 MG/ML injection Inject 100 mg into the muscle every 28 (twenty-eight) days.     traZODone (DESYREL) 100 MG tablet Take 1 tablet (100 mg total) by mouth at bedtime as needed for sleep. 30 tablet 0    PTA Medications: (Not in a hospital admission)      01/13/2022    9:32 PM 05/22/2021    5:37 PM  Depression screen PHQ 2/9  Decreased Interest 0 1  Down, Depressed, Hopeless 1 2  PHQ - 2 Score 1 3  Altered sleeping 2 2  Tired, decreased energy 1 1  Change in appetite 0 0  Feeling bad or failure about yourself  1 2  Trouble concentrating 1 1  Moving slowly or fidgety/restless 0 2  Suicidal thoughts 0 2  PHQ-9 Score 6 13  Difficult doing work/chores Very difficult Very difficult    Flowsheet Row ED from 01/13/2022 in [facility] ED from 05/22/2021 in [facility] ED from 02/26/2021 in [place] No Risk No Risk High Risk       Musculoskeletal  Strength & Muscle Tone: within normal limits Gait & Station: normal Patient leans: N/A  Psychiatric Specialty Exam  Presentation  General Appearance: Fairly Groomed  Eye Contact:Good  Speech:Clear and Coherent; Normal Rate  Speech Volume:Normal  Handedness:Right   Mood and Affect  Mood:Euthymic  Affect:Appropriate; Congruent   Thought Process  Thought Processes:Coherent; Goal Directed; Linear  Descriptions of Associations:Intact  Orientation:Full (Time, Place and Person)  Thought Content:Logical; WDL  Diagnosis of Schizophrenia or Schizoaffective disorder in past: Yes    Hallucinations:Hallucinations: None Description of Auditory Hallucinations: Voices in his body telling  him to hurt himself daily. Description of Visual Hallucinations:  Visions of flashes of light daily  Ideas of Reference:None  Suicidal Thoughts:Suicidal Thoughts: No  Homicidal Thoughts:Homicidal Thoughts: No   Sensorium  Memory:Immediate Good; Recent Fair  Judgment:Intact  Insight:Fair   Executive Functions  Concentration:Good  Attention Span:Good  [place] of Knowledge:Good  Language:Good   Psychomotor Activity  Psychomotor Activity:Psychomotor Activity: Normal   Assets  Assets:Communication Skills; Desire for Improvement; Financial Resources/Insurance; Intimacy; Leisure Time; Physical Health; Resilience; Social Support   Sleep  Sleep:Sleep: Fair   Nutritional Assessment (For OBS and FBC admissions only) Has  the patient had a weight loss or gain of 10 pounds or more in the last 3 months?: No Has the patient had a decrease in food intake/or appetite?: No Does the patient have dental problems?: No Does the patient have eating habits or behaviors that may be indicators of an eating disorder including binging or inducing vomiting?: No Has the patient recently lost weight without trying?: 0 Has the patient been eating poorly because of a decreased appetite?: 0 Malnutrition Screening Tool Score: 0    Physical Exam  Physical Exam Vitals and nursing note reviewed.  Constitutional:      Appearance: Normal appearance. He is well-developed and normal weight.  HENT:     Head: Normocephalic and atraumatic.     Nose: Nose normal.  Cardiovascular:     Rate and Rhythm: Normal rate.  Pulmonary:     Effort: Pulmonary effort is normal.  Musculoskeletal:        General: Normal range of motion.     Cervical back: Normal range of motion.  Skin:    General: Skin is warm and dry.  Neurological:     Mental Status: He is alert and oriented to person, place, and time.  Psychiatric:        Attention and Perception: Attention and perception normal.        Mood and Affect: Mood and affect normal.        Speech: Speech normal.         Behavior: Behavior normal. Behavior is cooperative.        Thought Content: Thought content normal.        Cognition and Memory: Cognition and memory normal.    Review of Systems  Constitutional: Negative.   HENT: Negative.    Eyes: Negative.   Respiratory: Negative.    Cardiovascular: Negative.   Gastrointestinal: Negative.   Genitourinary: Negative.   Musculoskeletal: Negative.   Skin: Negative.   Neurological: Negative.   Psychiatric/Behavioral: Negative.     Blood pressure 130/86, pulse 79, temperature 98.3 F (36.8 C), temperature source Oral, resp. rate 18, SpO2 99 %. There is no height or weight on file to calculate BMI.  Demographic Factors:  Male  Loss Factors: Loss of significant relationship  Historical Factors: NA  Risk Reduction Factors:   Living with another person, especially a relative, Positive social support, Positive therapeutic relationship, and Positive coping skills or problem solving skills  Continued Clinical Symptoms:  Previous Psychiatric Diagnoses and Treatments  Cognitive Features That Contribute To Risk:  None    Suicide Risk:  Minimal: No identifiable suicidal ideation.  Patients presenting with no risk factors but with morbid ruminations; may be classified as minimal risk based on the severity of the depressive symptoms  Plan Of Care/Follow-up recommendations:  Patient reviewed with Dr. Barrington Ellison. Follow-up with established outpatient psychiatry through [facility] ACT team.  ACT team member will pick you up upon discharge today. ACT team is actively seeking housing resources on your behalf through their transitions of living team Continue current medications.  Disposition: Discharge  Lucky Rathke, FNP 01/14/2022, 9:57 AM

## 2022-01-14 NOTE — ED Notes (Signed)
Patient discharge with a summary and follow up instructions provided. Patient's ACT team member picked up patient in [facility] lobby. Patient denies SI and HI at time of discharge. All belongings from [facility] locker returned.

## 2022-01-14 NOTE — ED Notes (Signed)
Patient is sleeping at this time - no sxs of distress noted - will continue to monitor for safety

## 2022-01-14 NOTE — Discharge Instructions (Addendum)

## 2022-01-14 NOTE — Progress Notes (Signed)
RN received report, patient currently resting with even and unlabored respirations. No acute distress observed.

## 2022-03-05 ENCOUNTER — Encounter (HOSPITAL_COMMUNITY): Payer: Self-pay | Admitting: Student

## 2022-03-05 ENCOUNTER — Ambulatory Visit (HOSPITAL_COMMUNITY)
Admission: EM | Admit: 2022-03-05 | Discharge: 2022-03-06 | Disposition: A | Discharge status: Home / Self Care | Payer: Medicaid Other | Attending: Psychiatry | Admitting: Psychiatry

## 2022-03-05 DIAGNOSIS — F17210 Nicotine dependence, cigarettes, uncomplicated: Secondary | ICD-10-CM | POA: Diagnosis not present

## 2022-03-05 DIAGNOSIS — Z79899 Other long term (current) drug therapy: Secondary | ICD-10-CM | POA: Diagnosis not present

## 2022-03-05 DIAGNOSIS — F1910 Other psychoactive substance abuse, uncomplicated: Secondary | ICD-10-CM

## 2022-03-05 DIAGNOSIS — F259 Schizoaffective disorder, unspecified: Secondary | ICD-10-CM | POA: Diagnosis present

## 2022-03-05 DIAGNOSIS — F1290 Cannabis use, unspecified, uncomplicated: Secondary | ICD-10-CM | POA: Insufficient documentation

## 2022-03-05 DIAGNOSIS — F419 Anxiety disorder, unspecified: Secondary | ICD-10-CM | POA: Insufficient documentation

## 2022-03-05 DIAGNOSIS — F1420 Cocaine dependence, uncomplicated: Secondary | ICD-10-CM | POA: Diagnosis present

## 2022-03-05 DIAGNOSIS — F250 Schizoaffective disorder, bipolar type: Secondary | ICD-10-CM | POA: Insufficient documentation

## 2022-03-05 DIAGNOSIS — Z5900 Homelessness unspecified: Secondary | ICD-10-CM | POA: Insufficient documentation

## 2022-03-05 DIAGNOSIS — Z8659 Personal history of other mental and behavioral disorders: Secondary | ICD-10-CM

## 2022-03-05 DIAGNOSIS — Z20822 Contact with and (suspected) exposure to covid-19: Secondary | ICD-10-CM | POA: Insufficient documentation

## 2022-03-05 DIAGNOSIS — F17200 Nicotine dependence, unspecified, uncomplicated: Secondary | ICD-10-CM

## 2022-03-05 DIAGNOSIS — Z91148 Patient's other noncompliance with medication regimen for other reason: Secondary | ICD-10-CM | POA: Insufficient documentation

## 2022-03-05 DIAGNOSIS — F1220 Cannabis dependence, uncomplicated: Secondary | ICD-10-CM | POA: Insufficient documentation

## 2022-03-05 DIAGNOSIS — F4321 Adjustment disorder with depressed mood: Secondary | ICD-10-CM

## 2022-03-05 DIAGNOSIS — Z59812 Housing instability, housed, homelessness in past 12 months: Secondary | ICD-10-CM | POA: Diagnosis present

## 2022-03-05 HISTORY — DX: Nicotine dependence, unspecified, uncomplicated: F17.200

## 2022-03-05 HISTORY — DX: Other psychoactive substance abuse, uncomplicated: F19.10

## 2022-03-05 LAB — CBC WITH DIFFERENTIAL/PLATELET
Abs Immature Granulocytes: 0.02 10*3/uL (ref 0.00–0.07)
Basophils Absolute: 0 10*3/uL (ref 0.0–0.1)
Basophils Relative: 0 %
Eosinophils Absolute: 0.1 10*3/uL (ref 0.0–0.5)
Eosinophils Relative: 1 %
HCT: 43.5 % (ref 39.0–52.0)
Hemoglobin: 14.2 g/dL (ref 13.0–17.0)
Immature Granulocytes: 0 %
Lymphocytes Relative: 31 %
Lymphs Abs: 2.2 10*3/uL (ref 0.7–4.0)
MCH: 29.3 pg (ref 26.0–34.0)
MCHC: 32.6 g/dL (ref 30.0–36.0)
MCV: 89.9 fL (ref 80.0–100.0)
Monocytes Absolute: 0.5 10*3/uL (ref 0.1–1.0)
Monocytes Relative: 7 %
Neutro Abs: 4.4 10*3/uL (ref 1.7–7.7)
Neutrophils Relative %: 61 %
Platelets: 276 10*3/uL (ref 150–400)
RBC: 4.84 MIL/uL (ref 4.22–5.81)
RDW: 12.8 % (ref 11.5–15.5)
WBC: 7.3 10*3/uL (ref 4.0–10.5)
nRBC: 0 % (ref 0.0–0.2)

## 2022-03-05 LAB — POCT URINE DRUG SCREEN - MANUAL ENTRY (I-SCREEN)
POC Amphetamine UR: NOT DETECTED
POC Buprenorphine (BUP): NOT DETECTED
POC Cocaine UR: POSITIVE — AB
POC Marijuana UR: POSITIVE — AB
POC Methadone UR: NOT DETECTED
POC Methamphetamine UR: NOT DETECTED
POC Morphine: NOT DETECTED
POC Oxazepam (BZO): NOT DETECTED
POC Oxycodone UR: NOT DETECTED
POC Secobarbital (BAR): NOT DETECTED

## 2022-03-05 LAB — COMPREHENSIVE METABOLIC PANEL
ALT: 25 U/L (ref 0–44)
AST: 45 U/L — ABNORMAL HIGH (ref 15–41)
Albumin: 4.8 g/dL (ref 3.5–5.0)
Alkaline Phosphatase: 62 U/L (ref 38–126)
Anion gap: 12 (ref 5–15)
BUN: 15 mg/dL (ref 6–20)
CO2: 28 mmol/L (ref 22–32)
Calcium: 10.1 mg/dL (ref 8.9–10.3)
Chloride: 100 mmol/L (ref 98–111)
Creatinine, Ser: 0.95 mg/dL (ref 0.61–1.24)
GFR, Estimated: >60 mL/min (ref >60)
Glucose, Bld: 71 mg/dL (ref 70–99)
Potassium: 4.7 mmol/L (ref 3.5–5.1)
Sodium: 140 mmol/L (ref 135–145)
Total Bilirubin: 1.1 mg/dL (ref 0.3–1.2)
Total Protein: 7.2 g/dL (ref 6.5–8.1)

## 2022-03-05 LAB — LIPID PANEL
Cholesterol: 208 mg/dL — ABNORMAL HIGH (ref 0–200)
HDL: 86 mg/dL (ref >40)
LDL Cholesterol: 114 mg/dL — ABNORMAL HIGH (ref 0–99)
Total CHOL/HDL Ratio: 2.4 RATIO
Triglycerides: 41 mg/dL (ref <150)
VLDL: 8 mg/dL (ref 0–40)

## 2022-03-05 LAB — RESP PANEL BY RT-PCR (FLU A&B, COVID) ARPGX2
Influenza A by PCR: NEGATIVE
Influenza B by PCR: NEGATIVE
SARS Coronavirus 2 by RT PCR: NEGATIVE

## 2022-03-05 LAB — HEPATITIS PANEL, ACUTE
HCV Ab: NONREACTIVE
Hep A IgM: NONREACTIVE
Hep B C IgM: NONREACTIVE
Hepatitis B Surface Ag: NONREACTIVE

## 2022-03-05 LAB — POC SARS CORONAVIRUS 2 AG: SARSCOV2ONAVIRUS 2 AG: NEGATIVE

## 2022-03-05 LAB — ETHANOL: Alcohol, Ethyl (B): <10 mg/dL (ref <10)

## 2022-03-05 LAB — TSH: TSH: 0.283 u[IU]/mL — ABNORMAL LOW (ref 0.350–4.500)

## 2022-03-05 LAB — HEMOGLOBIN A1C
Hgb A1c MFr Bld: 4.2 % — ABNORMAL LOW (ref 4.8–5.6)
Mean Plasma Glucose: 73.84 mg/dL

## 2022-03-05 LAB — HIV ANTIBODY (ROUTINE TESTING W REFLEX): HIV Screen 4th Generation wRfx: NONREACTIVE

## 2022-03-05 LAB — T4, FREE: Free T4: 0.97 ng/dL (ref 0.61–1.12)

## 2022-03-05 MED ORDER — NICOTINE 21 MG/24HR TD PT24
21.0000 mg | MEDICATED_PATCH | Freq: Every day | TRANSDERMAL | Status: DC
  Administered 2022-03-06: 21 mg via TRANSDERMAL
  Filled 2022-03-05: qty 1

## 2022-03-05 MED ORDER — MAGNESIUM HYDROXIDE 400 MG/5ML PO SUSP: 30.0000 mL | Freq: Every day | ORAL | Status: DC | PRN

## 2022-03-05 MED ORDER — BENZTROPINE MESYLATE 1 MG PO TABS
1.0000 mg | ORAL_TABLET | Freq: Every day | ORAL | Status: DC
  Administered 2022-03-05: 1 mg via ORAL
  Filled 2022-03-05: qty 1

## 2022-03-05 MED ORDER — OLANZAPINE 10 MG PO TBDP
10.0000 mg | ORAL_TABLET | Freq: Once | ORAL | Status: DC
  Filled 2022-03-05: qty 1

## 2022-03-05 MED ORDER — ZIPRASIDONE MESYLATE 20 MG IM SOLR: 20.0000 mg | INTRAMUSCULAR | Status: DC | PRN

## 2022-03-05 MED ORDER — ACETAMINOPHEN 325 MG PO TABS: 650.0000 mg | ORAL_TABLET | Freq: Four times a day (QID) | ORAL | Status: DC | PRN

## 2022-03-05 MED ORDER — OLANZAPINE 10 MG PO TBDP
10.0000 mg | ORAL_TABLET | Freq: Three times a day (TID) | ORAL | Status: DC | PRN
  Administered 2022-03-05: 10 mg via ORAL

## 2022-03-05 MED ORDER — LORAZEPAM 1 MG PO TABS: 1.0000 mg | ORAL_TABLET | ORAL | Status: DC | PRN

## 2022-03-05 MED ORDER — THIAMINE MONONITRATE 100 MG PO TABS
100.0000 mg | ORAL_TABLET | Freq: Every day | ORAL | Status: DC
  Administered 2022-03-05: 100 mg via ORAL
  Filled 2022-03-05 (×2): qty 1

## 2022-03-05 MED ORDER — ALUM & MAG HYDROXIDE-SIMETH 200-200-20 MG/5ML PO SUSP: 30.0000 mL | ORAL | Status: DC | PRN

## 2022-03-05 MED ORDER — OLANZAPINE 10 MG PO TBDP: 10.0000 mg | ORAL_TABLET | Freq: Every day | ORAL | Status: DC

## 2022-03-05 NOTE — ED Notes (Signed)
Patient resting quietly in bed with eyes closed, Respirations equal and unlabored, skin warm and dry, NAD. Routine safety checks conducted according to facility protocol. Will continue to monitor for safety. 

## 2022-03-05 NOTE — ED Notes (Signed)
Pt admitted to observation unit endorsing passive SI and cocaine and ETOH abuse. Pt cooperative throughout assessment. Disheveled appearance and foul body odor. Encouraged shower but pt refused. Meal offered. Oriented to unit and unit rules. Will monitor for safety.

## 2022-03-05 NOTE — ED Provider Notes (Addendum)
[facility] Continuous Assessment Admission H&P  Date: 03/05/22 Patient Name: Cody [name] Age: 31 y.o.  DOB: [birth year]  MRN: [id number]  Chief Complaint:  Chief Complaint  Patient presents with  . Schizophrenia  . Drug Problem     Diagnoses:  Final diagnoses:  None    HPI:  Cody [name] is a 31 y.o. 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  Dc'd from [facility] 01/14/2022 with [facility] Court date: 03/12/2022, for larceny  Reported home rx: Haldol LAI (unclear when last received, pt reported 2 weeks ago), cogentin qHS (unkn dose), and trazodone qHS  BAL pending UDS pending  Patient Narrative:  Appearance: Bizarre, Disheveled (Pt rearranged interview room. Malodorous, tied shirt around neck like scarf. Behavior pleasent, cooperative, engaged) Thought process: Coherent, Goal Directed. Circumstantial   Patient prefers to be called "Cody [name]".  Stated having an external AH, whispering at him to go home. Described home as [place] where his deceased mom's family lives. Voices belong to either, "my granddad's voice or mine or God's. I speak to God frequently. Spoke to God maybe today and especially when my mom died in 2021/06/08." Reported voices are improving in frequency and intensity. Denied voices commanding him to harm self or others.  Reported VH of himself walking down a yellow highway. Stated he is unsure where his VH of himself is trying to go, he assumed [place].  Reported lapse of AVH that started about 3-4days, when patient got kicked out of the boarding house for using  substances. Patient last time using substances was day prior to presentation, 03/04/2022 - smoking cocaine (0.5g), delta 8, kratum, tobacco.   Patient also reported poor sleep, saying he sleeps ~5-6hrs nightly. Stated that he lays down to sleep at 1130pm or 12am and is awake at 2am or 3am for about 1 week. Denied fatigue or sleepiness. Reported racing thoughts, having difficulties keeping up with them, irritability, and "real happy mood, until I think about my mom", pressured speech, hyperverbal.  Last manic episode was 3 weeks ago or 3 months ago, where patient "blanked" out and is unsure of its duration and what occurred during that time.   He denied depressed or low mood, denied anxiety or panic attacks.   Reported feeling sad when he thinks about his mom, that has been improving.   Other stressors include death of mother in 2021-06-08, having to leave the home that he was living in with his mom in early Sept 2023. After having to the leave his home, he reached out to his ACTT who him housing at the boarding house. He was there only 1 week before being asked to leave 03/01/2022 or 03/02/2022. Since then patient had been living in the backyard for his biological father. Stated that he does not want to return there, and was unable to live inside the house due to lack of room. Denied paranoid thoughts that people or family are after him or want to harm him. Denied need to protect himself. Denied access to guns or weapons. Denied HI. Stated that his brother, who lives in the same house was  the one who brought him to [facility].   Patient stated that he called his ACTT earlier today, to no avail to help with housing. Stated that he will try again.   He last saw his psychiatrist ~24mo ago, last saw [facility]T on 03/02/2022.  Verbal consent to HIV, RPR, hepatis panel labs.   Patient's goals are to detox, go to residential rehab, then go to [place]. Never been to residential detox.  Verbal consent to call people  below for collateral.  [phone] - Sharen Hint (cousin in [place]) Brother [phone] Emmit Oriley [facility]T: [phone] John  Reported current home rx: haldol LAI, cogentin qHS, and trazodone qHS.   Patient unsure of doses, stated that he last receive his LAI about 2 weeks. Reported medication adherence.   PJ:KDTOIZTI Thoughts: No WP:[id number] Thoughts: No NKN:[id number]: Auditory, Visual Description of Auditory Hallucinations: external voice telling him to go to [place] that belongs to either himself, his grandfather, or God. last time 9/21 Description of Visual Hallucinations: saw himself walking down a yellow highway, last time 9/21 Ideas of IOX:BDZH  Mania: distractibility, decreased need for sleep, increased energy, pressured speech, elated mood, risky behaviors, irritability, goal directed behaviors, flight of ideas, racing thoughts, hyperverbal speech, psychomotor restlessness.   Mood: Euphoric, Irritable, Labile Sleep:Poor (slept from 12am to 2am for past 4 days) Appetite: Good  Depression and anxiety: negative screening per above.   Review of Systems  Respiratory:  Negative for shortness of breath.   Cardiovascular:  Negative for chest pain and palpitations.  Gastrointestinal:  Negative for abdominal pain, diarrhea, nausea and vomiting.  Neurological:  Negative for dizziness, tremors and headaches.   Used to use crystal ice, EtOH. Last time used EtOH was 6 months ago.     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 Outpatient psychiatrist: Neoma Laming [phone] John Outpatient therapist: Denied Inpatient psych admission: Yes, multiple ([facility], [facility]) Suicide attempts: Denied  Family Psychiatric History:  Suicide: denied, maybe uncle  Psych admission: mom SCZ/SCzA or BiPD: mom Substance use: cocaine, mom  Social History: Living:  housing instability - currently unhoused Income: unemployed 2 kids 13yo and 19yo - staying with biological mom  Legal: court on 28th larcenation  Substance History: Stimulant: cocaine, meth, crack cocaine  Opioid: heroin  Cannabis, organic and synthetic - deta 8 (current) Nicotine: smokes cigarettes EtOH: Yes, not currently IVDU: remote past  PHQ 2-9:  Flowsheet Row ED from 03/05/2022 in [facility] ED from 01/13/2022 in [facility] ED from 05/22/2021 in [facility]  Thoughts that you would be better off dead, or of hurting yourself in some way Not at all Not at all More than half the days  PHQ-9 Total Score 9 6 13        Flowsheet Row ED from 03/05/2022 in [facility] ED from 01/13/2022 in [facility] ED from 05/22/2021 in [facility]  C-SSRS RISK CATEGORY Low Risk No Risk No Risk        Musculoskeletal  Strength & Muscle Tone: within normal limits Gait & Station: normal Patient leans: N/A   Physical Exam Vitals and nursing note reviewed.  Constitutional:      General: He is not in acute distress.    Appearance: He is not ill-appearing, toxic-appearing or diaphoretic.  HENT:     Head: Normocephalic.  Pulmonary:     Effort: Pulmonary effort is normal. No respiratory distress.  Neurological:     Mental Status: He is alert.  Psychiatric:        Behavior: Behavior is cooperative.     Psychiatric Specialty Exam  BP 111/83 (BP Location: Right Arm)   Pulse (!) 105 Comment: nurse notified  Temp 98.2 F (36.8 C) (Oral)   Resp 20   SpO2 100%   Presentation  General Appearance:Bizarre, Disheveled (Pt rearranged interview room. Malodorous, tied shirt around neck like scarf. Behavior pleasent, cooperative, engaged) Eye Contact: (Intense, decreased blinking) Speech:Clear and Coherent, Pressured  (interuptable) Volume:Normal Handedness:Right  Mood and Affect  Mood:Euphoric, Irritable, Labile Affect:Appropriate, Congruent, Labile, Full Range  Thought Process  Thought Process:Coherent, Goal Directed Descriptions of Associations:Circumstantial  Thought Content Suicidal Thoughts:Suicidal Thoughts: No Homicidal Thoughts:Homicidal Thoughts: No Hallucinations:Hallucinations: Auditory, Visual Description of Auditory Hallucinations: external voice telling him to go to [place] that belongs to either himself, his grandfather, or God. last time 9/21 Description of Visual Hallucinations: saw himself walking down a yellow highway, last time 9/21 Ideas of Reference:None Thought Content:Scattered, Tangential, Rumination, Delusions, Illogical (Goal directed activity - wants to go to [place] to be with deceased mom's family. Racing thoughts, improving. Insight on substances are contributing some to his psychosis and the medications help him.)  Sensorium  Memory:Immediate Fair Judgment:Fair (med adherent, amenable to LAI once confirmed last recieved) Insight:Fair  [place] (Time, Place and Person) Language:Good Concentration:Good [place] of Knowledge:Good  Psychomotor Activity  Psychomotor Activity:Psychomotor Activity: Increased, Restlessness, Extrapyramidal Side Effects (EPS) Extrapyramidal Side Effects (EPS): Akathisia AIMS Completed?: No  Assets  Assets:Resilience, Communication Skills, Desire for Improvement  Sleep  Quality:Poor (slept from 12am to 2am for past 4 days)    Nutritional Assessment (For OBS and FBC admissions only) Has the patient had a weight loss or gain of 10 pounds or more in the last 3 months?: No Has the patient had a decrease in food intake/or appetite?: No Does the patient have dental problems?: No Does the patient have eating habits or behaviors that may be indicators of an eating disorder  including binging or inducing vomiting?: No Has the patient recently lost weight without trying?: 0 Has the patient been eating poorly because of a decreased appetite?: 0 Malnutrition Screening Tool Score: 0      Is the patient at risk to self? No  Has the patient been a risk to self in the past 6 months? No .    Has the patient been a risk to self within the distant past? Yes   Is the patient a risk to others? No   Has the patient been a risk to others in the past 6 months? No   Has the patient been a risk to others within the distant past? Yes   Past Medical History:  Past Medical History:  Diagnosis Date  . Anxiety   . Cannabis use disorder 12/11/2014  . Mood disorder ([place])   . Schizophrenia ([place])   . 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 Tobacco use disorder 03/05/2022    No past surgical history on file. Family History: No family history on file. Social History:  Social History   Socioeconomic History  . Marital status: Single    Spouse name: Not on file  . Number of children: Not on file  . Years of education: Not on file  . Highest education level: Not on file  Occupational History  . Not on  file  Tobacco Use  . Smoking status: Smoker, Current Status Unknown    Packs/day: 0.25    Types: Cigarettes  . Smokeless tobacco: Never  Vaping Use  . Vaping Use: Never used  Substance and Sexual Activity  . Alcohol use: Yes  . Drug use: Yes    Types: Marijuana, Methamphetamines  . Sexual activity: Not Currently    Birth control/protection: None  Other Topics Concern  . Not on file  Social History Narrative   ** Merged History Encounter **       Social Determinants of Health   Financial Resource Strain: Not on file  Food Insecurity: Not on file  Transportation Needs: Not on file  Physical Activity: Not on file  Stress: Not on file  Social Connections: Not on file  Intimate Partner Violence: Not on file    SDOH:  SDOH Screenings   Alcohol  Screen: Low Risk  (09/09/2020)  Depression (PHQ2-9): Medium Risk (03/05/2022)  Tobacco Use: High Risk (03/05/2022)   Last Labs:  Admission on 03/05/2022  Component Date Value Ref Range Status  . SARS Coronavirus 2 by RT PCR 03/05/2022 NEGATIVE  NEGATIVE Final  . Influenza A by PCR 03/05/2022 NEGATIVE  NEGATIVE Final  . Influenza B by PCR 03/05/2022 NEGATIVE  NEGATIVE Final  . POC Amphetamine UR 03/05/2022 None Detected  NONE DETECTED (Cut Off Level 1000 ng/mL) Final  . POC Secobarbital (BAR) 03/05/2022 None Detected  NONE DETECTED (Cut Off Level 300 ng/mL) Final  . POC Buprenorphine (BUP) 03/05/2022 None Detected  NONE DETECTED (Cut Off Level 10 ng/mL) Final  . POC Oxazepam (BZO) 03/05/2022 None Detected  NONE DETECTED (Cut Off Level 300 ng/mL) Final  . POC Cocaine UR 03/05/2022 Positive (A)  NONE DETECTED (Cut Off Level 300 ng/mL) Final  . POC Methamphetamine UR 03/05/2022 None Detected  NONE DETECTED (Cut Off Level 1000 ng/mL) Final  . POC Morphine 03/05/2022 None Detected  NONE DETECTED (Cut Off Level 300 ng/mL) Final  . POC Methadone UR 03/05/2022 None Detected  NONE DETECTED (Cut Off Level 300 ng/mL) Final  . POC Oxycodone UR 03/05/2022 None Detected  NONE DETECTED (Cut Off Level 100 ng/mL) Final  . POC Marijuana UR 03/05/2022 Positive (A)  NONE DETECTED (Cut Off Level 50 ng/mL) Final  . SARSCOV2ONAVIRUS 2 AG 03/05/2022 NEGATIVE  NEGATIVE Final  Admission on 01/13/2022, Discharged on 01/14/2022  Component Date Value Ref Range Status  . SARS Coronavirus 2 by RT PCR 01/13/2022 NEGATIVE  NEGATIVE Final  . Influenza A by PCR 01/13/2022 NEGATIVE  NEGATIVE Final  . Influenza B by PCR 01/13/2022 NEGATIVE  NEGATIVE Final  . WBC 01/13/2022 8.5  4.0 - 10.5 K/uL Final  . RBC 01/13/2022 4.66  4.22 - 5.81 MIL/uL Final  . Hemoglobin 01/13/2022 13.6  13.0 - 17.0 g/dL Final  . HCT 16/10/960408/06/2021 41.1  39.0 - 52.0 % Final  . MCV 01/13/2022 88.2  80.0 - 100.0 fL Final  . MCH 01/13/2022 29.2  26.0 - 34.0  pg Final  . MCHC 01/13/2022 33.1  30.0 - 36.0 g/dL Final  . RDW 54/09/811908/06/2021 12.8  11.5 - 15.5 % Final  . Platelets 01/13/2022 204  150 - 400 K/uL Final  . nRBC 01/13/2022 0.0  0.0 - 0.2 % Final  . Neutrophils Relative % 01/13/2022 65  % Final  . Neutro Abs 01/13/2022 5.6  1.7 - 7.7 K/uL Final  . Lymphocytes Relative 01/13/2022 24  % Final  . Lymphs Abs 01/13/2022 2.0  0.7 -  4.0 K/uL Final  . Monocytes Relative 01/13/2022 9  % Final  . Monocytes Absolute 01/13/2022 0.8  0.1 - 1.0 K/uL Final  . Eosinophils Relative 01/13/2022 1  % Final  . Eosinophils Absolute 01/13/2022 0.1  0.0 - 0.5 K/uL Final  . Basophils Relative 01/13/2022 1  % Final  . Basophils Absolute 01/13/2022 0.0  0.0 - 0.1 K/uL Final  . Immature Granulocytes 01/13/2022 0  % Final  . Abs Immature Granulocytes 01/13/2022 0.02  0.00 - 0.07 K/uL Final  . Sodium 01/13/2022 137  135 - 145 mmol/L Final  . Potassium 01/13/2022 3.9  3.5 - 5.1 mmol/L Final  . Chloride 01/13/2022 105  98 - 111 mmol/L Final  . CO2 01/13/2022 26  22 - 32 mmol/L Final  . Glucose, Bld 01/13/2022 79  70 - 99 mg/dL Final  . BUN 00/93/8182 9  6 - 20 mg/dL Final  . Creatinine, Ser 01/13/2022 0.99  0.61 - 1.24 mg/dL Final  . Calcium 99/37/1696 9.4  8.9 - 10.3 mg/dL Final  . Total Protein 01/13/2022 6.4 (L)  6.5 - 8.1 g/dL Final  . Albumin 78/93/8101 4.1  3.5 - 5.0 g/dL Final  . AST 75/03/2584 31  15 - 41 U/L Final  . ALT 01/13/2022 24  0 - 44 U/L Final  . Alkaline Phosphatase 01/13/2022 57  38 - 126 U/L Final  . Total Bilirubin 01/13/2022 1.4 (H)  0.3 - 1.2 mg/dL Final  . GFR, Estimated 01/13/2022 >60  >60 mL/min Final  . Anion gap 01/13/2022 6  5 - 15 Final  . Hgb A1c MFr Bld 01/13/2022 4.2 (L)  4.8 - 5.6 % Final  . Mean Plasma Glucose 01/13/2022 73.84  mg/dL Final  . TSH 27/78/2423 0.112 (L)  0.350 - 4.500 uIU/mL Final  . Hepatitis B Surface Ag 01/13/2022 NON REACTIVE  NON REACTIVE Final  . HCV Ab 01/13/2022 NON REACTIVE  NON REACTIVE Final  . Hep A  IgM 01/13/2022 NON REACTIVE  NON REACTIVE Final  . Hep B C IgM 01/13/2022 NON REACTIVE  NON REACTIVE Final  . Cholesterol 01/13/2022 190  0 - 200 mg/dL Final  . Triglycerides 01/13/2022 28  <150 mg/dL Final  . HDL 53/61/4431 71  >40 mg/dL Final  . Total CHOL/HDL Ratio 01/13/2022 2.7  RATIO Final  . VLDL 01/13/2022 6  0 - 40 mg/dL Final  . LDL Cholesterol 01/13/2022 113 (H)  0 - 99 mg/dL Final  . POC Amphetamine UR 01/13/2022 None Detected  NONE DETECTED (Cut Off Level 1000 ng/mL) Final  . POC Secobarbital (BAR) 01/13/2022 None Detected  NONE DETECTED (Cut Off Level 300 ng/mL) Final  . POC Buprenorphine (BUP) 01/13/2022 None Detected  NONE DETECTED (Cut Off Level 10 ng/mL) Final  . POC Oxazepam (BZO) 01/13/2022 None Detected  NONE DETECTED (Cut Off Level 300 ng/mL) Final  . POC Cocaine UR 01/13/2022 None Detected  NONE DETECTED (Cut Off Level 300 ng/mL) Final  . POC Methamphetamine UR 01/13/2022 None Detected  NONE DETECTED (Cut Off Level 1000 ng/mL) Final  . POC Morphine 01/13/2022 None Detected  NONE DETECTED (Cut Off Level 300 ng/mL) Final  . POC Methadone UR 01/13/2022 None Detected  NONE DETECTED (Cut Off Level 300 ng/mL) Final  . POC Oxycodone UR 01/13/2022 None Detected  NONE DETECTED (Cut Off Level 100 ng/mL) Final  . POC Marijuana UR 01/13/2022 Positive (A)  NONE DETECTED (Cut Off Level 50 ng/mL) Final  . Alcohol, Ethyl (B) 01/13/2022 <10  <10 mg/dL Final  .  SARSCOV2ONAVIRUS 2 AG 01/13/2022 NEGATIVE  NEGATIVE Final   Allergies: Penicillins  PTA Medications: (Not in a hospital admission)  Long Term Goals: Improvement in symptoms so as ready for discharge Short Term Goals: Patient will verbalize feelings in meetings with treatment team members., Patient will attend at least of 50% of the groups daily., Pt will complete the PHQ9 on admission, day 3 and discharge., Patient will participate in completing the [place] Suicide Severity Rating Scale, Patient will score a low risk of  violence for 24 hours prior to discharge, and Patient will take medications as prescribed daily.  Medical Decision Making  JABRIL PURSELL is a 31 y.o. male with PMH SCZ, cannabis use d/o, stimulant use d/o (cocaine, last use 03/04/2022), tobacco use d/o, multiple past IP psych admission (last time at [facility] 09/09/2020), who presented voluntary to [facility] (03/05/2022) as a walk-in with brother initially for [facility] admission for cocaine detox, but was admitted to [facility] for continuous observation for re-emerging psychosis in the setting of cocaine use, synthetic cannabis use, and haldol LAI wearing off.  Dc'd from [facility] 01/14/2022 with [facility]T Court date: 03/12/2022, for larceny  Reported home rx: Haldol LAI (unclear when last received, pt reported 2 weeks ago), cogentin qHS (unkn dose), and trazodone qHS  BAL pending UDS pending  SCzA  synthetic cannabis use d/o  cocaine use d/o  tobacco use d/o Exhibited sxs of psychosis and mania for past 3-4 days - AVH, distractibility, decreased need for sleep, increased energy, pressured speech, elated mood, risky behaviors, irritability, goal directed behaviors, flight of ideas, racing thoughts, hyperverbal speech, psychomotor restlessness.  Sxs exacerbated by synthetic cannabis and cocaine use. Suspect recent lapse in substance use due to grief from mother's death dec 10/21/20. Patient has been stabilized on haldol LAI and cogentin, last psych admission 08/2020.  Reach out to ACTT to update and confirm last medications and LAI May administer haldol LAI if due Started zyprexa 10 mg x1 03/05/2022, then nightly until can confirm home rx with [facility] admission and referral to residential once psychosis and mania resolves  Akathisia vs mania Sxs of pacing and restlessness. Suspect 2/2 recent stimulant use, mania, and possibly akathisia if patient received LAI recently. R/o medical conditions per below.  Restarted home cogentin 1 mg qHS  Clinical Course as of 03/06/22 0815  Fri  Mar 06, 2022  7253 HIV Screen 4th Generation wRfx: Non Reactive [JN]  0812 Hepatitis panel, acute [JN]  0812 AST(!): 45 [JN]  0812 ALT: 25 [JN]  0812 Creatinine: 0.95 [JN]  0812 POC Cocaine UR(!): Positive [JN]  0812 POC Marijuana UR(!): Positive [JN]  0813 TSH(!): 0.283 [JN]  0813 T4,Free(Direct): 0.97 [JN]  0813 LDL (calc)(!): 114 [JN]  0813 Alcohol, Ethyl (B): <10 [JN]  0813 T3, free [JN]  0813 RPR [JN]    Clinical Course User Index [JN] Princess Bruins, DO   Total Time spent with patient: 30 minutes Recommendations  Based on my evaluation the patient does not appear to have an emergency medical condition.  Lab Orders         Resp Panel by RT-PCR (Flu A&B, Covid) Anterior Nasal Swab         CBC with Differential/Platelet         Comprehensive metabolic panel         Hemoglobin A1c         Ethanol         Lipid panel         TSH  Hepatitis panel, acute         HIV Antibody (routine testing w rflx)         RPR         T3, free         T4, free         POCT Urine Drug Screen - (I-Screen)         POC SARS Coronavirus 2 Ag     Scheduled Rx: benztropine, 1 mg, QHS [START ON 03/06/2022] nicotine, 21 mg, Q0600 OLANZapine zydis, 10 mg, Once thiamine, 100 mg, QHS  PRNs: acetaminophen, 650 mg, Q6H PRN alum & mag hydroxide-simeth, 30 mL, Q4H PRN OLANZapine zydis, 10 mg, Q8H PRN  And LORazepam, 1 mg, PRN  And ziprasidone, 20 mg, PRN magnesium hydroxide, 30 mL, Daily PRN    Signed: Princess Bruins, DO Psychiatry Resident, PGY-2 [facility] 03/05/2022, 8:35 PM

## 2022-03-05 NOTE — Progress Notes (Signed)
Patient presents seeking help.  He states that he has a lot going on. States that he was staying in a boarding house for 1 week, but got kicked out for smoking marijuana in his room.  He states that he has been on the street since then. Patient states that he has has not been compliant with his medication and states that he has been off and on with taking it.  Patient is somewht disorganized, somewhat hyper-verbal and speaking loudly.  He states that he has racing thoughts.  Patient denies SI/HI. Patient states that he is not currently hearing any voices. Patient states that he abuses marijuana and cocaine.  Last use of cocaine was yesterday and states that he smoked marijuana today.  Patient states that he has a good appetite, but states that he has not been sleeping well.  Patient is routine.
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
## 2022-03-06 ENCOUNTER — Telehealth (HOSPITAL_COMMUNITY): Payer: Self-pay

## 2022-03-06 LAB — GC/CHLAMYDIA PROBE AMP (~~LOC~~) NOT AT ARMC
Chlamydia: NEGATIVE
Comment: NEGATIVE
Comment: NORMAL
Neisseria Gonorrhea: NEGATIVE

## 2022-03-06 LAB — RPR: RPR Ser Ql: NONREACTIVE

## 2022-03-06 NOTE — Progress Notes (Signed)
Patient awake and ate breakfast this morning. Denies any pain/discomfort. Denies SI,HI, and A/V/H with no plan or intent. Patient remains cooperative on unit and is med compliant.

## 2022-03-06 NOTE — Discharge Instructions (Addendum)
Dear Cody [name],  Most effective treatment for your mental health disease involves BOTH a psychiatrist AND a therapist Psychiatrist to manage medications Therapist to help identify personal goals, barriers from those goals, and plan to achieve those goals by understanding emotions Please make regular appointments with an outpatient psychiatrist and other doctors once you leave the hospital (if any, otherwise, please see below for resources to make an appointment).  For therapy outside the hospital, please ask for these specific types of therapy: DBT ________________________________________________________  SAFETY CRISIS  Dial 988 for National Suicide & Crisis Lifeline    Text [phone] for Crisis Text Line:     [facility] URGENT CARE:  [address] FIRST FLOOR.  [place], [place] [address].  [phone]  Mobile Crisis Response Teams Listed by counties in vicinity of [facility] providers [facility] [phone] [facility] [address] [facility] [phone] [facility] [phone] [facility] [phone] [facility]                * [facility] [phone]                * [facility] [phone] [facility] [phone] [facility]  [phone] * [facility] [phone] ________________________________________________________  To see which pharmacy near you is the CHEAPEST for certain medications, please use GoodRx. It is free website and has a free phone app.    Also consider looking at [facility] $4.00 or [facility]'s $7.00 prescription list. Both are free to view if googled "[facility] $4 prescription" and "public's $7 prescription". These are set prices, no insurance required. [facility] low cost medications: $4-$15 for 30days  prescriptions or $10-$38 for 90days prescriptions  ________________________________________________________  Difficulties with sleep?   Can also use this free app for insomnia called CBT-I. Let your doctors and therapists know so they can help with extra tips and tricks or for guidance and accountability. NO ADDS on the app.     ________________________________________________________  Non-Emergent / Urgent  [facility] [address], SECOND FLOOR [place], [place] [address] [phone] OUTPATIENT Walk-in information: Please note, all walk-ins are first come & first serve, with limited number of availability.  Please note that to be eligible for services you must bring: ID or a piece of mail with your name [facility] address  Therapist for therapy:  Monday & Wednesdays: Please ARRIVE at 7:15 AM for registration Will START at 8:00 AM Every 1st & 2nd Friday of the month: Please ARRIVE at 10:15 AM for registration Will START at 1 PM - 5 PM  Psychiatrist for medication management: Monday - Friday:  Please ARRIVE at 7:15 AM for registration Will START at 8:00 AM  Regretfully, due to limited availability, please be aware that you may not been seen on the same day as walk-in. Please consider making an appoint or try again. Thank you for your patience and understanding.   Patient is instructed prior to discharge to:  Take all medications as prescribed by his/her mental healthcare provider. Report any adverse effects and or reactions from the medicines to his/her outpatient provider promptly. Keep all scheduled appointments, to ensure that you are getting refills on time and to avoid any interruption in your medication.  If you are unable to keep an appointment call to reschedule.  Be sure to follow-up with resources and follow-up appointments provided.  Patient has been instructed & cautioned: To not engage  in alcohol and or illegal drug use while on  prescription medicines. In the event of worsening symptoms, patient is instructed to call the crisis hotline, 911 and or go to the nearest ED for appropriate evaluation and treatment of symptoms. To follow-up with his/her primary care provider for your other medical issues, concerns and or health care needs.

## 2022-03-06 NOTE — ED Provider Notes (Signed)
[facility] Continuous Assessment Admission H&P  Date: 03/06/22 Patient Name: Cody [name] Age: 31 y.o.  DOB: Jun 03, 1991  MRN: [id number]  Chief Complaint:  Chief Complaint  Patient presents with  . Schizophrenia  . Drug Problem     Diagnoses:  Final diagnoses:  None    HPI:  Cody [name] is a 31 y.o. 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  [facility]22 with [facility]T Court date: 03/12/2022, for larceny  Reported home rx: Haldol LAI (unclear when last received, pt reported 2 weeks ago), cogentin qHS (unkn dose), and trazodone qHS  BAL pending UDS pending  Patient Narrative:  Appearance: Bizarre, Disheveled (Pt rearranged interview room. Malodorous, tied shirt around neck like scarf. Behavior pleasent, cooperative, engaged) Thought process: Coherent, Goal Directed. Circumstantial   Patient prefers to be called "Cody [name]".  Stated having an external AH, whispering at him to go home. [place] his deceased mom's family lives. Voices belong to either, "my granddad's voice or mine or God's. I speak to God frequently. Spoke to God maybe today and especially when my mom died in May 20, 2021." Reported voices are improving in frequency and intensity. Denied voices commanding him to harm self or others.  Reported VH of himself walking down a yellow highway. Stated he is unsure where his VH of himself is trying to go, he assumed [place].  Reported lapse of AVH that started about 3-4days, when patient got kicked out of the boarding house for using  substances. Patient last time using substances was day prior to presentation, 03/04/2022 - smoking cocaine (0.5g), delta 8, kratum, tobacco.   Patient also reported poor sleep, saying he sleeps ~5-6hrs nightly. Stated that he lays down to sleep at 1130pm or 12am and is awake at 2am or 3am for about 1 week. Denied fatigue or sleepiness. Reported racing thoughts, having difficulties keeping up with them, irritability, and "real happy mood, until I think about my mom", pressured speech, hyperverbal.  Last manic episode was 3 weeks ago or 3 months ago, where patient "blanked" out and is unsure of its duration and what occurred during that time.   He denied depressed or low mood, denied anxiety or panic attacks.   Reported feeling sad when he thinks about his mom, that has been improving.   Other stressors include death of mother in May 20, 2021, having to leave the home that he was living in with his mom in early Sept 2023. After having to the leave his home, he reached out to his ACTT who him housing at the boarding house. He was there only 1 week before being asked to leave 03/01/2022 or 03/02/2022. Since then patient had been living in the backyard for his biological father. Stated that he does not want to return there, and was unable to live inside the house due to lack of room. Denied paranoid thoughts that people or family are after him or want to harm him. Denied need to protect himself. Denied access to guns or weapons. Denied HI. Stated that his brother, who lives in the same house was  the one who brought him to [facility].   Patient stated that he called his ACTT earlier today, to no avail to help with housing. Stated that he will try again.   He last saw his psychiatrist ~24mo ago, last saw ACTT on 03/02/2022.  Verbal consent to HIV, RPR, hepatis panel labs.   Patient's goals are to detox, go to residential rehab, then go to [place]. Never been to residential detox.  Verbal consent to call people  below for collateral.  [phone] - Sharen Hint (cousin in [place]) Brother [phone] Emmit Oriley ACTT: [phone] John  Reported current home rx: haldol LAI, cogentin qHS, and trazodone qHS.   Patient unsure of doses, stated that he last receive his LAI about 2 weeks. Reported medication adherence.   PJ:KDTOIZTI Thoughts: No WP:[id number] Thoughts: No NKN:[id number]: Auditory, Visual Description of Auditory Hallucinations: external voice telling him to go to [place] that belongs to either himself, his grandfather, or God. last time 9/21 Description of Visual Hallucinations: saw himself walking down a yellow highway, last time 9/21 Ideas of IOX:BDZH  Mania: distractibility, decreased need for sleep, increased energy, pressured speech, elated mood, risky behaviors, irritability, goal directed behaviors, flight of ideas, racing thoughts, hyperverbal speech, psychomotor restlessness.   Mood: Euphoric, Irritable, Labile Sleep:Poor (slept from 12am to 2am for past 4 days) Appetite: Good  Depression and anxiety: negative screening per above.   Review of Systems  Respiratory:  Negative for shortness of breath.   Cardiovascular:  Negative for chest pain and palpitations.  Gastrointestinal:  Negative for abdominal pain, diarrhea, nausea and vomiting.  Neurological:  Negative for dizziness, tremors and headaches.   Used to use crystal ice, EtOH. Last time used EtOH was 6 months ago.     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 Outpatient psychiatrist: Neoma Laming [phone] John Outpatient therapist: Denied Inpatient psych admission: Yes, multiple ([facility], [facility]) Suicide attempts: Denied  Family Psychiatric History:  Suicide: denied, maybe uncle  Psych admission: mom SCZ/SCzA or BiPD: mom Substance use: cocaine, mom  Social History: Living:  housing instability - currently unhoused Income: unemployed 2 kids 13yo and 19yo - staying with biological mom  Legal: court on 28th larcenation  Substance History: Stimulant: cocaine, meth, crack cocaine  Opioid: heroin  Cannabis, organic and synthetic - deta 8 (current) Nicotine: smokes cigarettes EtOH: Yes, not currently IVDU: remote past  PHQ 2-9:  Flowsheet Row ED from 03/05/2022 in [facility] ED from 01/13/2022 in [facility] ED from 05/22/2021 in [facility]  Thoughts that you would be better off dead, or of hurting yourself in some way Not at all Not at all More than half the days  PHQ-9 Total Score 9 6 13        Flowsheet Row ED from 03/05/2022 in [facility] ED from 01/13/2022 in [facility] ED from 05/22/2021 in [facility]  C-SSRS RISK CATEGORY Low Risk No Risk No Risk        Musculoskeletal  Strength & Muscle Tone: within normal limits Gait & Station: normal Patient leans: N/A   Physical Exam Vitals and nursing note reviewed.  Constitutional:      General: He is not in acute distress.    Appearance: He is not ill-appearing, toxic-appearing or diaphoretic.  HENT:     Head: Normocephalic.  Pulmonary:     Effort: Pulmonary effort is normal. No respiratory distress.  Neurological:     Mental Status: He is alert.  Psychiatric:        Behavior: Behavior is cooperative.     Psychiatric Specialty Exam  BP 114/74 (BP Location: Right Arm)   Pulse 72   Temp 98.1 F (36.7 C) (Tympanic)   Resp 18   SpO2 100%   Presentation  General Appearance:Bizarre, Disheveled (Pt rearranged interview room. Malodorous, tied shirt around neck like scarf. Behavior pleasent, cooperative, engaged) Eye Contact: (Intense, decreased blinking) Speech:Clear and Coherent, Pressured  (interuptable) Volume:Normal Handedness:Right  Mood and Affect  Mood:Euphoric, Irritable, Labile Affect:Appropriate, Congruent, Labile, Full Range  Thought Process  Thought Process:Coherent, Goal Directed Descriptions of Associations:Circumstantial  Thought Content Suicidal Thoughts:Suicidal Thoughts: No Homicidal Thoughts:Homicidal Thoughts: No Hallucinations:Hallucinations: Auditory, Visual Description of Auditory Hallucinations: external voice telling him to go to [place] that belongs to either himself, his grandfather, or God. last time 9/21 Description of Visual Hallucinations: saw himself walking down a yellow highway, last time 9/21 Ideas of Reference:None Thought Content:Scattered, Tangential, Rumination, Delusions, Illogical (Goal directed activity - wants to go to [place] to be with deceased mom's family. Racing thoughts, improving. Insight on substances are contributing some to his psychosis and the medications help him.)  Sensorium  Memory:Immediate Fair Judgment:Fair (med adherent, amenable to LAI once confirmed last recieved) Insight:Fair  Executive Functions  Orientation:Full (Time, Place and Person) Language:Good Concentration:Good Attention:Good Recall:Good Fund of Knowledge:Good  Psychomotor Activity  Psychomotor Activity:Psychomotor Activity: Increased, Restlessness, Extrapyramidal Side Effects (EPS) Extrapyramidal Side Effects (EPS): Akathisia AIMS Completed?: No  Assets  Assets:Resilience, Communication Skills, Desire for Improvement  Sleep  Quality:Poor (slept from 12am to 2am for past 4 days)    Nutritional Assessment (For OBS and [facility] admissions only) Has the patient had a weight loss or gain of 10 pounds or more in the last 3 months?: No Has the patient had a decrease in food intake/or appetite?: No Does the patient have dental problems?: No Does the patient have eating habits or behaviors that may be indicators of an eating disorder  including binging or inducing vomiting?: No Has the patient recently lost weight without trying?: 0 Has the patient been eating poorly because of a decreased appetite?: 0 Malnutrition Screening Tool Score: 0      Is the patient at risk to self? No  Has the patient been a risk to self in the past 6 months? No .    Has the patient been a risk to self within the distant past? Yes   Is the patient a risk to others? No   Has the patient been a risk to others in the past 6 months? No   Has the patient been a risk to others within the distant past? Yes   Past Medical History:  Past Medical History:  Diagnosis Date  . Anxiety   . Cannabis use disorder 12/11/2014  . Mood disorder (HCC)   . Schizophrenia (HCC)   . 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 Tobacco use disorder 03/05/2022    No past surgical history on file. Family History: No family history on file. Social History:  Social History   Socioeconomic History  . Marital status: Single    Spouse name: Not on file  . Number of children: Not on file  . Years of education: Not on file  . Highest education level: Not on file  Occupational History  . Not on file  Tobacco  Use  . Smoking status: Smoker, Current Status Unknown    Packs/day: 0.25    Types: Cigarettes  . Smokeless tobacco: Never  Vaping Use  . Vaping Use: Never used  Substance and Sexual Activity  . Alcohol use: Yes  . Drug use: Yes    Types: Marijuana, Methamphetamines  . Sexual activity: Not Currently    Birth control/protection: None  Other Topics Concern  . Not on file  Social History Narrative   ** Merged History Encounter **       Social Determinants of Health   Financial Resource Strain: Not on file  Food Insecurity: Not on file  Transportation Needs: Not on file  Physical Activity: Not on file  Stress: Not on file  Social Connections: Not on file  Intimate Partner Violence: Not on file    SDOH:  SDOH Screenings   Alcohol  Screen: Low Risk  (09/09/2020)  Depression (PHQ2-9): Medium Risk (03/05/2022)  Tobacco Use: High Risk (03/05/2022)   Last Labs:  Admission on 03/05/2022  Component Date Value Ref Range Status  . SARS Coronavirus 2 by RT PCR 03/05/2022 NEGATIVE  NEGATIVE Final  . Influenza A by PCR 03/05/2022 NEGATIVE  NEGATIVE Final  . Influenza B by PCR 03/05/2022 NEGATIVE  NEGATIVE Final  . WBC 03/05/2022 7.3  4.0 - 10.5 K/uL Final  . RBC 03/05/2022 4.84  4.22 - 5.81 MIL/uL Final  . Hemoglobin 03/05/2022 14.2  13.0 - 17.0 g/dL Final  . HCT 16/03/9603 43.5  39.0 - 52.0 % Final  . MCV 03/05/2022 89.9  80.0 - 100.0 fL Final  . MCH 03/05/2022 29.3  26.0 - 34.0 pg Final  . MCHC 03/05/2022 32.6  30.0 - 36.0 g/dL Final  . RDW 54/02/8118 12.8  11.5 - 15.5 % Final  . Platelets 03/05/2022 276  150 - 400 K/uL Final  . nRBC 03/05/2022 0.0  0.0 - 0.2 % Final  . Neutrophils Relative % 03/05/2022 61  % Final  . Neutro Abs 03/05/2022 4.4  1.7 - 7.7 K/uL Final  . Lymphocytes Relative 03/05/2022 31  % Final  . Lymphs Abs 03/05/2022 2.2  0.7 - 4.0 K/uL Final  . Monocytes Relative 03/05/2022 7  % Final  . Monocytes Absolute 03/05/2022 0.5  0.1 - 1.0 K/uL Final  . Eosinophils Relative 03/05/2022 1  % Final  . Eosinophils Absolute 03/05/2022 0.1  0.0 - 0.5 K/uL Final  . Basophils Relative 03/05/2022 0  % Final  . Basophils Absolute 03/05/2022 0.0  0.0 - 0.1 K/uL Final  . Immature Granulocytes 03/05/2022 0  % Final  . Abs Immature Granulocytes 03/05/2022 0.02  0.00 - 0.07 K/uL Final  . Sodium 03/05/2022 140  135 - 145 mmol/L Final  . Potassium 03/05/2022 4.7  3.5 - 5.1 mmol/L Final  . Chloride 03/05/2022 100  98 - 111 mmol/L Final  . CO2 03/05/2022 28  22 - 32 mmol/L Final  . Glucose, Bld 03/05/2022 71  70 - 99 mg/dL Final  . BUN 14/78/2956 15  6 - 20 mg/dL Final  . Creatinine, Ser 03/05/2022 0.95  0.61 - 1.24 mg/dL Final  . Calcium 21/30/8657 10.1  8.9 - 10.3 mg/dL Final  . Total Protein 03/05/2022 7.2  6.5 - 8.1  g/dL Final  . Albumin 84/69/6295 4.8  3.5 - 5.0 g/dL Final  . AST 28/41/3244 45 (H)  15 - 41 U/L Final  . ALT 03/05/2022 25  0 - 44 U/L Final  . Alkaline Phosphatase 03/05/2022 62  38 - 126 U/L Final  . Total Bilirubin 03/05/2022 1.1  0.3 - 1.2 mg/dL Final  . GFR, Estimated 03/05/2022 >60  >60 mL/min Final  . Anion gap 03/05/2022 12  5 - 15 Final  . Hgb A1c MFr Bld 03/05/2022 4.2 (L)  4.8 - 5.6 % Final  . Mean Plasma Glucose 03/05/2022 73.84  mg/dL Final  . Alcohol, Ethyl (B) 03/05/2022 <10  <10 mg/dL Final  . Cholesterol 16/03/9603 208 (H)  0 - 200 mg/dL Final  . Triglycerides 03/05/2022 41  <150 mg/dL Final  . HDL 54/02/8118 86  >40 mg/dL Final  . Total CHOL/HDL Ratio 03/05/2022 2.4  RATIO Final  . VLDL 03/05/2022 8  0 - 40 mg/dL Final  . LDL Cholesterol 03/05/2022 114 (H)  0 - 99 mg/dL Final  . TSH 14/78/2956 0.283 (L)  0.350 - 4.500 uIU/mL Final  . Hepatitis B Surface Ag 03/05/2022 NON REACTIVE  NON REACTIVE Final  . HCV Ab 03/05/2022 NON REACTIVE  NON REACTIVE Final  . Hep A IgM 03/05/2022 NON REACTIVE  NON REACTIVE Final  . Hep B C IgM 03/05/2022 NON REACTIVE  NON REACTIVE Final  . POC Amphetamine UR 03/05/2022 None Detected  NONE DETECTED (Cut Off Level 1000 ng/mL) Final  . POC Secobarbital (BAR) 03/05/2022 None Detected  NONE DETECTED (Cut Off Level 300 ng/mL) Final  . POC Buprenorphine (BUP) 03/05/2022 None Detected  NONE DETECTED (Cut Off Level 10 ng/mL) Final  . POC Oxazepam (BZO) 03/05/2022 None Detected  NONE DETECTED (Cut Off Level 300 ng/mL) Final  . POC Cocaine UR 03/05/2022 Positive (A)  NONE DETECTED (Cut Off Level 300 ng/mL) Final  . POC Methamphetamine UR 03/05/2022 None Detected  NONE DETECTED (Cut Off Level 1000 ng/mL) Final  . POC Morphine 03/05/2022 None Detected  NONE DETECTED (Cut Off Level 300 ng/mL) Final  . POC Methadone UR 03/05/2022 None Detected  NONE DETECTED (Cut Off Level 300 ng/mL) Final  . POC Oxycodone UR 03/05/2022 None Detected  NONE DETECTED (Cut  Off Level 100 ng/mL) Final  . POC Marijuana UR 03/05/2022 Positive (A)  NONE DETECTED (Cut Off Level 50 ng/mL) Final  . HIV Screen 4th Generation wRfx 03/05/2022 Non Reactive  Non Reactive Final  . Free T4 03/05/2022 0.97  0.61 - 1.12 ng/dL Final  . [id number] 2 AG 03/05/2022 NEGATIVE  NEGATIVE Final  Admission on 01/13/2022, Discharged on 01/14/2022  Component Date Value Ref Range Status  . SARS Coronavirus 2 by RT PCR 01/13/2022 NEGATIVE  NEGATIVE Final  . Influenza A by PCR 01/13/2022 NEGATIVE  NEGATIVE Final  . Influenza B by PCR 01/13/2022 NEGATIVE  NEGATIVE Final  . WBC 01/13/2022 8.5  4.0 - 10.5 K/uL Final  . RBC 01/13/2022 4.66  4.22 - 5.81 MIL/uL Final  . Hemoglobin 01/13/2022 13.6  13.0 - 17.0 g/dL Final  . HCT 28/41/3244 41.1  39.0 - 52.0 % Final  . MCV 01/13/2022 88.2  80.0 - 100.0 fL Final  . MCH 01/13/2022 29.2  26.0 - 34.0 pg Final  . MCHC 01/13/2022 33.1  30.0 - 36.0 g/dL Final  . RDW 06/17/7251 12.8  11.5 - 15.5 % Final  . Platelets 01/13/2022 204  150 - 400 K/uL Final  . nRBC 01/13/2022 0.0  0.0 - 0.2 % Final  . Neutrophils Relative % 01/13/2022 65  % Final  . Neutro Abs 01/13/2022 5.6  1.7 - 7.7 K/uL Final  . Lymphocytes Relative 01/13/2022 24  % Final  . Lymphs Abs 01/13/2022  2.0  0.7 - 4.0 K/uL Final  . Monocytes Relative 01/13/2022 9  % Final  . Monocytes Absolute 01/13/2022 0.8  0.1 - 1.0 K/uL Final  . Eosinophils Relative 01/13/2022 1  % Final  . Eosinophils Absolute 01/13/2022 0.1  0.0 - 0.5 K/uL Final  . Basophils Relative 01/13/2022 1  % Final  . Basophils Absolute 01/13/2022 0.0  0.0 - 0.1 K/uL Final  . Immature Granulocytes 01/13/2022 0  % Final  . Abs Immature Granulocytes 01/13/2022 0.02  0.00 - 0.07 K/uL Final  . Sodium 01/13/2022 137  135 - 145 mmol/L Final  . Potassium 01/13/2022 3.9  3.5 - 5.1 mmol/L Final  . Chloride 01/13/2022 105  98 - 111 mmol/L Final  . CO2 01/13/2022 26  22 - 32 mmol/L Final  . Glucose, Bld 01/13/2022 79  70 - 99  mg/dL Final  . BUN 87/86/7672 9  6 - 20 mg/dL Final  . Creatinine, Ser 01/13/2022 0.99  0.61 - 1.24 mg/dL Final  . Calcium 09/47/0962 9.4  8.9 - 10.3 mg/dL Final  . Total Protein 01/13/2022 6.4 (L)  6.5 - 8.1 g/dL Final  . Albumin 83/66/2947 4.1  3.5 - 5.0 g/dL Final  . AST 65/46/5035 31  15 - 41 U/L Final  . ALT 01/13/2022 24  0 - 44 U/L Final  . Alkaline Phosphatase 01/13/2022 57  38 - 126 U/L Final  . Total Bilirubin 01/13/2022 1.4 (H)  0.3 - 1.2 mg/dL Final  . GFR, Estimated 01/13/2022 >60  >60 mL/min Final  . Anion gap 01/13/2022 6  5 - 15 Final  . Hgb A1c MFr Bld 01/13/2022 4.2 (L)  4.8 - 5.6 % Final  . Mean Plasma Glucose 01/13/2022 73.84  mg/dL Final  . TSH 46/56/8127 0.112 (L)  0.350 - 4.500 uIU/mL Final  . Hepatitis B Surface Ag 01/13/2022 NON REACTIVE  NON REACTIVE Final  . HCV Ab 01/13/2022 NON REACTIVE  NON REACTIVE Final  . Hep A IgM 01/13/2022 NON REACTIVE  NON REACTIVE Final  . Hep B C IgM 01/13/2022 NON REACTIVE  NON REACTIVE Final  . Cholesterol 01/13/2022 190  0 - 200 mg/dL Final  . Triglycerides 01/13/2022 28  <150 mg/dL Final  . HDL 51/70/0174 71  >40 mg/dL Final  . Total CHOL/HDL Ratio 01/13/2022 2.7  RATIO Final  . VLDL 01/13/2022 6  0 - 40 mg/dL Final  . LDL Cholesterol 01/13/2022 113 (H)  0 - 99 mg/dL Final  . POC Amphetamine UR 01/13/2022 None Detected  NONE DETECTED (Cut Off Level 1000 ng/mL) Final  . POC Secobarbital (BAR) 01/13/2022 None Detected  NONE DETECTED (Cut Off Level 300 ng/mL) Final  . POC Buprenorphine (BUP) 01/13/2022 None Detected  NONE DETECTED (Cut Off Level 10 ng/mL) Final  . POC Oxazepam (BZO) 01/13/2022 None Detected  NONE DETECTED (Cut Off Level 300 ng/mL) Final  . POC Cocaine UR 01/13/2022 None Detected  NONE DETECTED (Cut Off Level 300 ng/mL) Final  . POC Methamphetamine UR 01/13/2022 None Detected  NONE DETECTED (Cut Off Level 1000 ng/mL) Final  . POC Morphine 01/13/2022 None Detected  NONE DETECTED (Cut Off Level 300 ng/mL) Final  .  POC Methadone UR 01/13/2022 None Detected  NONE DETECTED (Cut Off Level 300 ng/mL) Final  . POC Oxycodone UR 01/13/2022 None Detected  NONE DETECTED (Cut Off Level 100 ng/mL) Final  . POC Marijuana UR 01/13/2022 Positive (A)  NONE DETECTED (Cut Off Level 50 ng/mL) Final  . Alcohol, Ethyl (B) 01/13/2022 <  10  <10 mg/dL Final  . SARSCOV2ONAVIRUS 2 AG 01/13/2022 NEGATIVE  NEGATIVE Final   Allergies: Penicillins  PTA Medications: (Not in a hospital admission)  Long Term Goals: Improvement in symptoms so as ready for discharge Short Term Goals: Patient will verbalize feelings in meetings with treatment team members., Patient will attend at least of 50% of the groups daily., Pt will complete the PHQ9 on admission, day 3 and discharge., Patient will participate in completing the [place], Patient will score a low risk of violence for 24 hours prior to discharge, and Patient will take medications as prescribed daily.  Medical Decision Making  Cody [name] is a 31 y.o. male with PMH SCZ, cannabis use d/o, stimulant use d/o (cocaine, last use 03/04/2022), tobacco use d/o, multiple past IP psych admission (last time at [facility] 09/09/2020), who presented voluntary to [facility] (03/05/2022) as a walk-in with brother initially for [facility] admission for cocaine detox, but was admitted to [facility] for continuous observation for re-emerging psychosis in the setting of cocaine use, synthetic cannabis use, and haldol LAI wearing off.  Dc'd from [facility] 01/14/2022 with ACTT Court date: 03/12/2022, for larceny  Reported home rx: Haldol LAI (unclear when last received, pt reported 2 weeks ago), cogentin qHS (unkn dose), and trazodone qHS  BAL pending UDS pending  SCzA  synthetic cannabis use d/o  cocaine use d/o  tobacco use d/o Exhibited sxs of psychosis and mania for past 3-4 days - AVH, distractibility, decreased need for sleep, increased energy, pressured speech, elated mood, risky behaviors,  irritability, goal directed behaviors, flight of ideas, racing thoughts, hyperverbal speech, psychomotor restlessness.  Sxs exacerbated by synthetic cannabis and cocaine use. Suspect recent lapse in substance use due to grief from mother's death 06/16/21. Patient has been stabilized on haldol LAI and cogentin, last psych admission 08/2020.  Reach out to ACTT to update and confirm last medications and LAI May administer haldol LAI if due Started zyprexa 10 mg x1 03/06/2022, then nightly until can confirm home rx with [facility] admission and referral to residential once psychosis and mania resolves  Akathisia vs mania Sxs of pacing and restlessness. Suspect 2/2 recent stimulant use, mania, and possibly akathisia if patient received LAI recently. R/o medical conditions per below.  Restarted home cogentin 1 mg qHS  Clinical Course as of 03/06/22 1829  Fri Mar 06, 2022  9371 HIV Screen 4th Generation wRfx: Non Reactive [JN]  0812 Hepatitis panel, acute [JN]  0812 AST(!): 45 [JN]  0812 ALT: 25 [JN]  0812 Creatinine: 0.95 [JN]  0812 POC Cocaine UR(!): Positive [JN]  0812 POC Marijuana UR(!): Positive [JN]  0813 TSH(!): 0.283 [JN]  0813 T4,Free(Direct): 0.97 [JN]  0813 LDL (calc)(!): 114 [JN]  0813 Alcohol, Ethyl (B): <10 [JN]  0813 T3, free [JN]  0813 RPR [JN]    Clinical Course User Index [JN] Merrily Brittle, DO   Total Time spent with patient: 30 minutes Recommendations  Based on my evaluation the patient does not appear to have an emergency medical condition.  Lab Orders         Resp Panel by RT-PCR (Flu A&B, Covid) Anterior Nasal Swab         CBC with Differential/Platelet         Comprehensive metabolic panel         Hemoglobin A1c         Ethanol         Lipid panel  TSH         Hepatitis panel, acute         HIV Antibody (routine testing w rflx)         RPR         T3, free         T4, free         POCT Urine Drug Screen - (I-Screen)         POC SARS Coronavirus 2  Ag     Scheduled Rx: benztropine, 1 mg, QHS nicotine, 21 mg, Q0600 OLANZapine zydis, 10 mg, Once thiamine, 100 mg, QHS  PRNs: acetaminophen, 650 mg, Q6H PRN alum & mag hydroxide-simeth, 30 mL, Q4H PRN OLANZapine zydis, 10 mg, Q8H PRN  And LORazepam, 1 mg, PRN  And ziprasidone, 20 mg, PRN magnesium hydroxide, 30 mL, Daily PRN    Signed: Princess Bruins, DO Psychiatry Resident, PGY-2 [facility] Based Crisis 03/06/2022, 8:11 AM

## 2022-03-06 NOTE — ED Notes (Signed)
Patient is discharging at this time. Vs stable. Patient denies SI,HI, and A/V/H with no plan/intent. Printed AVS reviewed with and given to patient along with medications and follow up appointments. Patient verbalized all understanding. All valuables/belongings returned to patient. Patient is being transported by his ACT team. Patient denies any pain/discomfort. No s/s of current distress.

## 2022-03-06 NOTE — BH Assessment (Signed)
Care Management - [place] Follow Up Discharges   Writer attempted to make contact with patient today and was unsuccessful.  Phone just rang, no voicemail.  Per chart review, patient will follow up with his established ACTT Team ([facility])

## 2022-03-06 NOTE — ED Provider Notes (Signed)
FBC/OBS ASAP Discharge Summary  Date and Time: 03/06/2022 9:45 AM  Name: Cody [name]  MRN:  [id number]   Discharge Diagnoses:  Final diagnoses:  Cocaine use disorder, severe, dependence (HCC)  Schizoaffective disorder, bipolar type (HCC)  Housing instability after recent homelessness    Subjective: Patient states "I am fine, I am ready to go."    Patient is reassessed, face-to-face, by nurse practitioner.  He is reclined in observation area upon my approach, appears asleep.  He is easily awakened.  Patient is alert and oriented, pleasant and cooperative during assessment.  Patient presents with euthymic mood, congruent affect.  Librado denies suicidal and homicidal ideation.  He easily contracts verbally for safety with this Clinical research associate.  He denies visual and auditory hallucinations.  There is no evidence of delusional thought content and no indication that patient is responding to internal stimuli.  He denies symptoms of paranoia.  He is insightful today regarding cocaine use.  He believes that cocaine use does not pair well with his medications and is committed to his sobriety and recovery.  He endorses most recent cocaine use on yesterday.  Current plan includes abstaining from alcohol and substance use moving forward.  He plans to review substance use treatment options with [facility] act team.  He has been diagnosed with schizoaffective disorder as well as major depressive disorder.  He is compliant with medications including long-acting injectable haloperidol, administered by his act team monthly.  He is intermittently compliant with medications aside from long-acting injectable.  He endorses history of multiple previous inpatient psychiatric hospitalizations.  No family mental health history reported.  Patient is currently homeless in [place].  His act team had assisted him with residing in a boardinghouse however he was recently asked to leave the boardinghouse, likely related  to his substance use.  He denies access to weapons.  He receives disability income.  He endorses average sleep and appetite.  Patient offered support and encouragement.  He gives verbal consent to speak with his act team, spoke with [facility] act team member, Diona Browner phone number [phone].Diona Browner shares that patient has had difficulty with housing and is currently awaiting housing options after being asked to leave his boardinghouse recently.  ACT team member is aware of patient's cocaine use and verbalizes plan to assist with substance use treatment options moving forward.  ACT team member verbalizes that patient is compliant with long-acting injectable medication.  ACT team will pick up Jep today around noon, potentially will transport to shelter if no other housing options available.   Patient and family are educated and verbalize understanding of mental health resources and other crisis services in the community. They are instructed to call 911 and present to the nearest emergency room should patient experience any suicidal/homicidal ideation, auditory/visual/hallucinations, or detrimental worsening of mental health condition.     Stay Summary: HPI from 03/05/2022 at 1636pm:  CHRISTAPHER [name] is a 31 y.o. male with PMH SCZ, cannabis use d/o, stimulant use d/o (cocaine, last use 03/04/2022), tobacco use d/o, multiple past IP psych admission (last time at [facility] 09/09/2020), who presented voluntary to [facility] (03/05/2022) as a walk-in with brother initially for [facility] admission for cocaine detox, but was admitted to [facility] for continuous observation for re-emerging psychosis in the setting of cocaine use, synthetic cannabis use, and haldol LAI wearing off.  Dc'd from [facility] 01/14/2022 with ACTT Court date: 03/12/2022, for larceny   Reported home rx: Haldol LAI (unclear when last received, pt  reported 2 weeks ago), cogentin qHS (unkn dose), and trazodone qHS   BAL pending UDS pending   Patient Narrative:   Appearance: Bizarre, Disheveled (Pt rearranged interview room. Malodorous, tied shirt around neck like scarf. Behavior pleasent, cooperative, engaged) Thought process: Coherent, Goal Directed. Circumstantial    Patient prefers to be called "Cody [name]".  Stated having an external AH, whispering at him to go home. Described home as [place] where his deceased mom's family lives. Voices belong to either, "my granddad's voice or mine or God's. I speak to God frequently. Spoke to God maybe today and especially when my mom died in May 29, 2021." Reported voices are improving in frequency and intensity. Denied voices commanding him to harm self or others.  Reported VH of himself walking down a yellow highway. Stated he is unsure where his VH of himself is trying to go, he assumed [place].  Reported lapse of AVH that started about 3-4days, when patient got kicked out of the boarding house for using substances. Patient last time using substances was day prior to presentation, 03/04/2022 - smoking cocaine (0.5g), delta 8, kratum, tobacco.    Patient also reported poor sleep, saying he sleeps ~5-6hrs nightly. Stated that he lays down to sleep at 1130pm or 12am and is awake at 2am or 3am for about 1 week. Denied fatigue or sleepiness. Reported racing thoughts, having difficulties keeping up with them, irritability, and "real happy mood, until I think about my mom", pressured speech, hyperverbal.  Last manic episode was 3 weeks ago or 3 months ago, where patient "blanked" out and is unsure of its duration and what occurred during that time.    He denied depressed or low mood, denied anxiety or panic attacks.    Reported feeling sad when he thinks about his mom, that has been improving.    Other stressors include death of mother in 2021-05-29, having to leave the home that he was living in with his mom in early Sept 2023. After having to the leave his home, he reached out to his ACTT who him housing at the  boarding house. He was there only 1 week before being asked to leave 03/01/2022 or 03/02/2022. Since then patient had been living in the backyard for his biological father. Stated that he does not want to return there, and was unable to live inside the house due to lack of room. Denied paranoid thoughts that people or family are after him or want to harm him. Denied need to protect himself. Denied access to guns or weapons. Denied HI. Stated that his brother, who lives in the same house was the one who brought him to [facility].    Patient stated that he called his ACTT earlier today, to no avail to help with housing. Stated that he will try again.    He last saw his psychiatrist ~83mo ago, last saw ACTT on 03/02/2022.   Verbal consent to HIV, RPR, hepatis panel labs.    Patient's goals are to detox, go to residential rehab, then go to [place]. Never been to residential detox.   Verbal consent to call people below for collateral.  [phone] - Sharen Hint (cousin in [place]) Brother [phone] Rooney Swails ACTT: [phone] John   Reported current home rx: haldol LAI, cogentin qHS, and trazodone qHS.    Patient unsure of doses, stated that he last receive his LAI about 2 weeks. Reported medication adherence.      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 Family History: No family history on file. Family Psychiatric History: none reported Social History:  Social History   Substance and Sexual Activity  Alcohol Use Yes     Social History   Substance and Sexual Activity  Drug Use Yes   Types:  Marijuana, Methamphetamines    Social History   Socioeconomic History   Marital status: Single    Spouse name: Not on file   Number of children: Not on file   Years of education: Not on file   Highest education level: Not on file  Occupational History   Not on file  Tobacco Use   Smoking status: Smoker, Current Status Unknown    Packs/day: 0.25    Types: Cigarettes   Smokeless tobacco: Never  Vaping Use   Vaping Use: Never used  Substance and Sexual Activity   Alcohol use: Yes   Drug use: Yes    Types: Marijuana, Methamphetamines   Sexual activity: Not Currently    Birth control/protection: None  Other Topics Concern   Not on file  Social History Narrative   ** Merged History Encounter **       Social Determinants of Health   Financial Resource Strain: Not on file  Food Insecurity: Not on file  Transportation Needs: Not on file  Physical Activity: Not on file  Stress: Not on file  Social Connections: Not on file   SDOH:  SDOH Screenings   Alcohol Screen: Low Risk  (09/09/2020)  Depression (PHQ2-9): Medium Risk (03/05/2022)  Tobacco Use: High Risk (03/05/2022)    Tobacco Cessation:  A prescription for an FDA-approved tobacco cessation medication was offered at discharge and the patient refused  Current Medications:  Current Facility-Administered Medications  Medication Dose Route Frequency Provider Last Rate Last Admin   acetaminophen (TYLENOL) tablet 650 mg  650 mg Oral Q6H PRN Princess Bruins, DO       alum & mag hydroxide-simeth (MAALOX/MYLANTA) 200-200-20 MG/5ML suspension 30 mL  30 mL Oral Q4H PRN Princess Bruins, DO       benztropine (COGENTIN) tablet 1 mg  1 mg Oral QHS Princess Bruins, DO   1 mg at 03/05/22 2153   OLANZapine zydis (ZYPREXA) disintegrating tablet 10 mg  10 mg Oral Q8H PRN Princess Bruins, DO   10 mg at 03/05/22 2153   And   LORazepam (ATIVAN) tablet 1 mg  1 mg Oral PRN Princess Bruins, DO       And   ziprasidone (GEODON) injection 20 mg  20 mg  Intramuscular PRN Princess Bruins, DO       magnesium hydroxide (MILK OF MAGNESIA) suspension 30 mL  30 mL Oral Daily PRN Princess Bruins, DO       nicotine (NICODERM CQ - dosed in mg/24 hours) patch 21 mg  21 mg Transdermal Q0600 Princess Bruins, DO   21 mg at 03/06/22 0930   OLANZapine zydis (ZYPREXA) disintegrating tablet 10 mg  10 mg Oral Once Princess Bruins, DO       thiamine (VITAMIN B1) tablet 100 mg  100 mg Oral QHS Princess Bruins, DO   100 mg at 03/05/22 2153  No current outpatient medications on file.    PTA Medications: (Not in a hospital admission)      03/05/2022    5:45 PM 01/13/2022    9:32 PM 05/22/2021    5:37 PM  Depression screen PHQ 2/9  Decreased Interest  0 1  Down, Depressed, Hopeless 2 1 2   PHQ - 2 Score 2 1 3   Altered sleeping 2 2 2   Tired, decreased energy 0 1 1  Change in appetite 0 0 0  Feeling bad or failure about yourself  1 1 2   Trouble concentrating 2 1 1   Moving slowly or fidgety/restless 2 0 2  Suicidal thoughts 0 0 2  PHQ-9 Score 9 6 13   Difficult doing work/chores Somewhat difficult Very difficult Very difficult    Flowsheet [facility]  C-SSRS RISK CATEGORY Low Risk No Risk No Risk       Musculoskeletal  Strength & Muscle Tone: within normal limits Gait & Station: normal Patient leans: N/A  Psychiatric Specialty Exam  Presentation  General Appearance: Appropriate for Environment; Casual  Eye Contact:Good  Speech:Clear and Coherent; Normal Rate  Speech Volume:Normal  Handedness:Right   Mood and Affect  Mood:Euthymic  Affect:Appropriate; Congruent   Thought Process  Thought Processes:Coherent; Goal Directed; Linear  Descriptions of Associations:Intact  Orientation:Full (Time, Place and Person)  Thought Content:Logical; WDL  Diagnosis of Schizophrenia  or Schizoaffective disorder in past: No    Hallucinations:Hallucinations: None Description of Auditory Hallucinations: external voice telling him to go to [place] that belongs to either himself, his grandfather, or God. last time 9/21 Description of Visual Hallucinations: saw himself walking down a yellow highway, last time 9/21  Ideas of Reference:None  Suicidal Thoughts:Suicidal Thoughts: No  Homicidal Thoughts:Homicidal Thoughts: No   Sensorium  Memory:Immediate Good  Judgment:Fair  Insight:Good   Executive Functions  Concentration:Good  Attention Span:Good  Recall:Good  Fund of Knowledge:Good  Language:Good   Psychomotor Activity  Psychomotor Activity:Psychomotor Activity: Normal Extrapyramidal Side Effects (EPS): Akathisia AIMS Completed?: No   Assets  Assets:Desire for Improvement; Manufacturing systems engineerCommunication Skills; Intimacy; Physical Health; Resilience; Social Support   Sleep  Sleep:Sleep: Fair   Nutritional Assessment (For OBS and FBC admissions only) Has the patient had a weight loss or gain of 10 pounds or more in the last 3 months?: No Has the patient had a decrease in food intake/or appetite?: No Does the patient have dental problems?: No Does the patient have eating habits or behaviors that may be indicators of an eating disorder including binging or inducing vomiting?: No Has the patient recently lost weight without trying?: 0 Has the patient been eating poorly because of a decreased appetite?: 0 Malnutrition Screening Tool Score: 0    Physical Exam  Physical Exam Vitals and nursing note reviewed.  Constitutional:      Appearance: Normal appearance. He is well-developed and normal weight.  HENT:     Head: Normocephalic and atraumatic.     Nose: Nose normal.  Cardiovascular:     Rate and Rhythm: Normal rate.  Pulmonary:     Effort: Pulmonary effort is normal.  Musculoskeletal:        General: Normal range of motion.     Cervical back:  Normal range of motion.  Skin:    General: Skin is warm and dry.  Neurological:     Mental Status: He is  alert and oriented to person, place, and time.  Psychiatric:        Attention and Perception: Attention and perception normal.        Mood and Affect: Mood and affect normal.        Speech: Speech normal.        Behavior: Behavior normal. Behavior is cooperative.        Thought Content: Thought content normal.        Cognition and Memory: Cognition and memory normal.    Review of Systems  Constitutional: Negative.   HENT: Negative.    Eyes: Negative.   Respiratory: Negative.    Cardiovascular: Negative.   Gastrointestinal: Negative.   Genitourinary: Negative.   Musculoskeletal: Negative.   Skin: Negative.   Neurological: Negative.   Psychiatric/Behavioral:  Positive for substance abuse.    Blood pressure 114/74, pulse 72, temperature 98.1 F (36.7 C), temperature source Tympanic, resp. rate 18, SpO2 100 %. There is no height or weight on file to calculate BMI.  Demographic Factors:  Male and Unemployed  Loss Factors: NA  Historical Factors: NA  Risk Reduction Factors:   Positive social support, Positive therapeutic relationship, and Positive coping skills or problem solving skills  Continued Clinical Symptoms:  Previous Psychiatric Diagnoses and Treatments  Cognitive Features That Contribute To Risk:  None    Suicide Risk:  Minimal: No identifiable suicidal ideation.  Patients presenting with no risk factors but with morbid ruminations; may be classified as minimal risk based on the severity of the depressive symptoms  Plan Of Care/Follow-up recommendations:  Patient reviewed with Dr Melba Coon.  Follow up with established outpatient psychiatry, [facility] ACT team. ACT team will pick you up today.   Continue current medications: -Benztropine 2 mg nightly -Haldol decanoate 100 mg q. 28 days -Trazodone 100 mg nightly  Follow up with substance  use treatment options provided.   Disposition: Discharge  Lucky Rathke, FNP 03/06/2022, 9:45 AM

## 2022-03-06 NOTE — ED Notes (Signed)
Patient awake and ate breakfast. Patient denies SI,HI, and A/V/H with no plan/intent. Patient denies any discomfort. Patient cooperative and remains safe on unit.

## 2022-03-07 LAB — T3, FREE: T3, Free: 3.5 pg/mL (ref 2.0–4.4)

## 2022-04-03 ENCOUNTER — Emergency Department (HOSPITAL_COMMUNITY)
Admission: EM | Admit: 2022-04-03 | Discharge: 2022-04-04 | Discharge status: Against Medical Advice | Payer: Medicaid Other | Attending: Emergency Medicine | Admitting: Emergency Medicine

## 2022-04-03 ENCOUNTER — Emergency Department (HOSPITAL_COMMUNITY): Payer: Medicaid Other

## 2022-04-03 ENCOUNTER — Encounter (HOSPITAL_COMMUNITY): Payer: Self-pay

## 2022-04-03 ENCOUNTER — Other Ambulatory Visit: Payer: Self-pay

## 2022-04-03 DIAGNOSIS — Z5321 Procedure and treatment not carried out due to patient leaving prior to being seen by health care provider: Secondary | ICD-10-CM | POA: Insufficient documentation

## 2022-04-03 DIAGNOSIS — R55 Syncope and collapse: Secondary | ICD-10-CM | POA: Insufficient documentation

## 2022-04-03 DIAGNOSIS — R079 Chest pain, unspecified: Secondary | ICD-10-CM | POA: Insufficient documentation

## 2022-04-03 DIAGNOSIS — R0602 Shortness of breath: Secondary | ICD-10-CM | POA: Diagnosis not present

## 2022-04-03 LAB — CBC
HCT: 41.2 % (ref 39.0–52.0)
Hemoglobin: 13.3 g/dL (ref 13.0–17.0)
MCH: 29 pg (ref 26.0–34.0)
MCHC: 32.3 g/dL (ref 30.0–36.0)
MCV: 89.8 fL (ref 80.0–100.0)
Platelets: 218 10*3/uL (ref 150–400)
RBC: 4.59 MIL/uL (ref 4.22–5.81)
RDW: 12.7 % (ref 11.5–15.5)
WBC: 6.5 10*3/uL (ref 4.0–10.5)
nRBC: 0 % (ref 0.0–0.2)

## 2022-04-03 LAB — BASIC METABOLIC PANEL
Anion gap: 5 (ref 5–15)
BUN: 10 mg/dL (ref 6–20)
CO2: 28 mmol/L (ref 22–32)
Calcium: 9.4 mg/dL (ref 8.9–10.3)
Chloride: 105 mmol/L (ref 98–111)
Creatinine, Ser: 0.85 mg/dL (ref 0.61–1.24)
GFR, Estimated: >60 mL/min (ref >60)
Glucose, Bld: 114 mg/dL — ABNORMAL HIGH (ref 70–99)
Potassium: 3.9 mmol/L (ref 3.5–5.1)
Sodium: 138 mmol/L (ref 135–145)

## 2022-04-03 LAB — TROPONIN I (HIGH SENSITIVITY)
Troponin I (High Sensitivity): 2 ng/L (ref <18)
Troponin I (High Sensitivity): 2 ng/L (ref <18)

## 2022-04-03 NOTE — ED Triage Notes (Signed)
Per EMS- Patient was at a park. [employer] found the patient. Patient was on a slide when EMS arrived.  Patient reports that he smoked weed, did ice and cocaine and is not sure if he passed out or fell asleep. Patient is alert and oriented and is ambulatory.

## 2022-04-03 NOTE — ED Provider Triage Note (Signed)
Emergency Medicine Provider Triage Evaluation Note  Cody [name] , a 31 y.o. male  was evaluated in triage.  Pt complains of syncope, chest pain.  Patient states that he was smoking crack cocaine and marijuana shortly after noon today.  He states that he had an episode where he passed out.  He heard loud voices.  He had associated chest pain and shortness of breath at that time, which is now resolved.  Eating chicken wings on arrival to the room.  Review of Systems  Positive: Chest pain, shortness of breath Negative: Headache, fever  Physical Exam  BP 118/87   Pulse 71   Temp 98.6 F (37 C)   Resp 18   SpO2 98%  Gen:   Awake, no distress   Resp:  Normal effort  MSK:   Moves extremities without difficulty  Other:  Regular rhythm  Medical Decision Making  Medically screening exam initiated at 4:27 PM.  Appropriate orders placed.  Cody [name] was informed that the remainder of the evaluation will be completed by another provider, this initial triage assessment does not replace that evaluation, and the importance of remaining in the ED until their evaluation is complete.     Carlisle Cater, PA-C 04/03/22 1628

## 2022-04-04 ENCOUNTER — Emergency Department (HOSPITAL_COMMUNITY)
Admission: EM | Admit: 2022-04-04 | Discharge: 2022-04-04 | Disposition: A | Discharge status: Home / Self Care | Payer: Medicaid Other | Source: Home / Self Care | Attending: Emergency Medicine | Admitting: Emergency Medicine

## 2022-04-04 ENCOUNTER — Other Ambulatory Visit: Payer: Self-pay

## 2022-04-04 DIAGNOSIS — R55 Syncope and collapse: Secondary | ICD-10-CM

## 2022-04-04 NOTE — ED Provider Notes (Signed)
[facility]-EMERGENCY DEPT Provider Note   CSN: [id number] Arrival date & time: 04/04/22  0865     History  Chief Complaint  Patient presents with   Near Syncope    Cody [name] is a 31 y.o. male.  Patient here after possibly passing out after dinner and some drugs last night.  He left without being seen earlier last night.  States he has been doing well but came back because he needs food.  Denies any chest pain, headache, weakness, vision changes, nausea, vomiting.  Nothing makes it worse or better.  Denies any symptoms at this time.  The history is provided by the patient.       Home Medications Prior to Admission medications   Medication Sig Start Date End Date Taking? Authorizing Provider  benztropine (COGENTIN) 2 MG tablet Take 2 mg by mouth at bedtime.    [provider]  haloperidol decanoate (HALDOL DECANOATE) 100 MG/ML injection Inject 100 mg into the muscle every 28 (twenty-eight) days.    [provider]  traZODone (DESYREL) 100 MG tablet Take 100 mg by mouth at bedtime.    [provider]      Allergies    Penicillins    Review of Systems   Review of Systems  Physical Exam Updated Vital Signs BP (!) 127/92 (BP Location: Left Arm)   Pulse 83   Temp 98.5 F (36.9 C) (Oral)   Resp 15   Ht 5\' 8"  (1.727 m)   Wt 70.8 kg   SpO2 98%   BMI 23.73 kg/m  Physical Exam Vitals and nursing note reviewed.  Constitutional:      General: He is not in acute distress.    Appearance: He is well-developed. He is not ill-appearing.  HENT:     Head: Normocephalic and atraumatic.  Eyes:     Extraocular Movements: Extraocular movements intact.     Conjunctiva/sclera: Conjunctivae normal.     Pupils: Pupils are equal, round, and reactive to light.  Cardiovascular:     Rate and Rhythm: Normal rate and regular rhythm.     Pulses: Normal pulses.     Heart sounds: Normal heart sounds. No murmur heard. Pulmonary:      Effort: Pulmonary effort is normal. No respiratory distress.     Breath sounds: Normal breath sounds.  Abdominal:     Palpations: Abdomen is soft.     Tenderness: There is no abdominal tenderness.  Musculoskeletal:        General: No swelling.     Cervical back: Normal range of motion and neck supple.  Skin:    General: Skin is warm and dry.     Capillary Refill: Capillary refill takes less than 2 seconds.  Neurological:     General: No focal deficit present.     Mental Status: He is alert and oriented to person, place, and time.     Cranial Nerves: No cranial nerve deficit.     Sensory: No sensory deficit.     Motor: No weakness.     Gait: Gait normal.  Psychiatric:        Mood and Affect: Mood normal.     ED Results / Procedures / Treatments   Labs (all labs ordered are listed, but only abnormal results are displayed) Labs Reviewed - No data to display  EKG None  Radiology DG Chest [facility] 1 View  Result Date: 04/03/2022 CLINICAL DATA:  Syncope and chest pain EXAM: PORTABLE CHEST 1 VIEW  COMPARISON:  None Available. FINDINGS: The heart size and mediastinal contours are within normal limits. Both lungs are clear. The visualized skeletal structures are unremarkable. IMPRESSION: No active disease. Electronically Signed   By: Donavan Foil M.D.   On: 04/03/2022 17:04    Procedures Procedures    Medications Ordered in ED Medications - No data to display  ED Course/ Medical Decision Making/ A&P                           Medical Decision Making  Cody [name] is here after syncopal episode after doing drugs last night.  Normal vitals.  No fever.  History of schizophrenia.  He states that he left after having to wait a long time.  Blood work was done last night when he first came with CBC, BMP, troponin per my review and interpretation these labs are unremarkable.  Chest x-ray per my review and interpretation shows no pneumothorax or pneumonia.  He appears very well.  He is  requesting food.  I have no concern for acute cardiac or pulmonary or neurologic process.  I educated him about abstaining from drug use.  He has no chest pain or shortness of breath.  I have no concern for ACS or PE or infectious process.  Patient discharged in good condition.  This chart was dictated using voice recognition software.  Despite best efforts to proofread,  errors can occur which can change the documentation meaning.         Final Clinical Impression(s) / ED Diagnoses Final diagnoses:  Near syncope    Rx / DC Orders ED Discharge Orders     None         Lennice Sites, DO 04/04/22 4627

## 2022-04-04 NOTE — ED Triage Notes (Signed)
Pt stated he passed out earlier after doing cocaine, ice and smoking weed, here earlier but left and came back

## 2022-09-13 ENCOUNTER — Ambulatory Visit (HOSPITAL_COMMUNITY)
Admission: EM | Admit: 2022-09-13 | Discharge: 2022-09-14 | Disposition: A | Discharge status: Other Acute Inpatient Hospital | Payer: Medicaid Other | Attending: Urology | Admitting: Urology

## 2022-09-13 DIAGNOSIS — F250 Schizoaffective disorder, bipolar type: Secondary | ICD-10-CM | POA: Insufficient documentation

## 2022-09-13 DIAGNOSIS — Z1152 Encounter for screening for COVID-19: Secondary | ICD-10-CM | POA: Diagnosis not present

## 2022-09-13 DIAGNOSIS — F1410 Cocaine abuse, uncomplicated: Secondary | ICD-10-CM | POA: Diagnosis not present

## 2022-09-13 LAB — POCT URINE DRUG SCREEN - MANUAL ENTRY (I-SCREEN)
POC Amphetamine UR: NOT DETECTED
POC Buprenorphine (BUP): NOT DETECTED
POC Cocaine UR: NOT DETECTED
POC Marijuana UR: NOT DETECTED
POC Methadone UR: NOT DETECTED
POC Methamphetamine UR: NOT DETECTED
POC Morphine: NOT DETECTED
POC Oxazepam (BZO): NOT DETECTED
POC Oxycodone UR: NOT DETECTED
POC Secobarbital (BAR): NOT DETECTED

## 2022-09-13 LAB — POC SARS CORONAVIRUS 2 AG: SARSCOV2ONAVIRUS 2 AG: NEGATIVE

## 2022-09-13 MED ORDER — MAGNESIUM HYDROXIDE 400 MG/5ML PO SUSP: 30.0000 mL | Freq: Every day | ORAL | Status: DC | PRN

## 2022-09-13 MED ORDER — HYDROXYZINE HCL 25 MG PO TABS: 25.0000 mg | ORAL_TABLET | Freq: Three times a day (TID) | ORAL | Status: DC | PRN

## 2022-09-13 MED ORDER — ALUM & MAG HYDROXIDE-SIMETH 200-200-20 MG/5ML PO SUSP: 30.0000 mL | ORAL | Status: DC | PRN

## 2022-09-13 MED ORDER — TRAZODONE HCL 50 MG PO TABS
50.0000 mg | ORAL_TABLET | Freq: Every evening | ORAL | Status: DC | PRN
  Administered 2022-09-13: 50 mg via ORAL
  Filled 2022-09-13: qty 1

## 2022-09-13 MED ORDER — ACETAMINOPHEN 325 MG PO TABS
650.0000 mg | ORAL_TABLET | Freq: Four times a day (QID) | ORAL | Status: DC | PRN
  Administered 2022-09-13: 650 mg via ORAL
  Filled 2022-09-13: qty 2

## 2022-09-13 NOTE — ED Provider Notes (Signed)
[facility] Continuous Assessment Admission H&P  Date: 09/14/22 Patient Name: Cody [name] MRN: [place]:[id number] Chief Complaint: "my head is bothering me, there is something in my head."  Diagnoses:  Final diagnoses:  Schizoaffective disorder, bipolar type [facility])    HPI: [name] is a 32 year old male with psychiatric history of schizoaffective disorder, bipolar type, cannabis abuse, and cocaine abuse.  Patient presented voluntarily to [facility] with chief complaint of "my head is bothering me, there is something in my head."  Patient was seen face-to-face and his chart was reviewed by this nurse petitioner.  On assessment, patient is alert and oriented x 4. He is disheveled and appears restless.  Patient speaks in a monotone voice at moderate pace. Patient's mood is depressed and anxious with constricted affect, patient stares intensively into space and appears to be responding to internal stimuli.   Patient reported that he has been hearing voices.  Patient says he was at a family function today and "the shock came back."  He states "my head is bothering me; there is something in my head."  Patient is observed grabbing his hair tightly as he describes auditory hallucination.  He reports that he is hearing a voice in his head that says "get out of here."  He reports that he is unable to function due to the auditory hallucination.  He says that he is depressed and having thoughts of harming himself.  Patient denies any suicidal plan or intent. he acknowledges depressive symptoms of isolation, irritability, hopelessness, increased anxiety, poor focus, poor sleep, and worthlessness. Patient denies homicidal ideation and visual hallucination.  Patient endorses frequent paranoia; he reports that he smokes approximately 1 g of marijuana per day and drinks 1 to 2 12 ounce cans of beer about 1-2 days/week. He has history of cocaine abuse but denies recent use. USD is nagative today, BAL is 38 on  admission.   Collateral: Patient denied verbal consent to contact his ACT team as well as his brother Harmony Rossner [phone], Criss Rosales [phone], and brother Loralyn Freshwater [phone] Patient receives medication management through [facility] [phone] after hours [phone]. Per ACT Team member, Olivia Mackie patient is on Haldol Decanoate 100mg /ml Injection Q4weeks. Last injection on 08/25/2022.  Sheppard Coil says he has been experiencing auditory hallucination and saying he wants to hurt himself and others. He also report patient has been acting strangely. He says [facility] paid for patient to stay in a hotel but patient gave up his room to unknown male. He says patient is conceived that rapper/celebrity Dayton Scrape is his girlfriend and he is also her Freight forwarder. He report that patient was unable to recognize his daughter today.    Total Time spent with patient: 20 minutes  Musculoskeletal  Strength & Muscle Tone: within normal limits Gait & Station: normal Patient leans: Right  Psychiatric Specialty Exam  Presentation General Appearance:  Appropriate for Environment  Eye Contact: Good  Speech: Clear and Coherent  Speech Volume: Normal  Handedness: Right   Mood and Affect  Mood: Depressed; Anxious  Affect: Constricted   Thought Process  Thought Processes: Coherent  Descriptions of Associations:Intact  Orientation:Full (Time, Place and Person)  Thought Content:WDL  Diagnosis of Schizophrenia or Schizoaffective disorder in past: Yes  Duration of Psychotic Symptoms: Greater than six months  Hallucinations:Hallucinations: Auditory Description of Auditory Hallucinations: "get out of here"  Ideas of Reference:None  Suicidal Thoughts:Suicidal Thoughts: No  Homicidal Thoughts:Homicidal Thoughts: No   Sensorium  Memory: Immediate Good; Recent Poor; Remote Poor  Judgment: Fair  Insight: Fair   Community education officer   Concentration: Poor  Attention Span: Poor  Recall: [employer] of Knowledge: Fair  Language: Fair   Psychomotor Activity  Psychomotor Activity: Psychomotor Activity: Normal   Assets  Assets: Armed forces logistics/support/administrative officer; Desire for Improvement; Social Support; Physical Health; Resilience   Sleep  Sleep: Sleep: Fair Number of Hours of Sleep: 5   Nutritional Assessment (For OBS and FBC admissions only) Has the patient had a weight loss or gain of 10 pounds or more in the last 3 months?: No Has the patient had a decrease in food intake/or appetite?: No Does the patient have dental problems?: No Does the patient have eating habits or behaviors that may be indicators of an eating disorder including binging or inducing vomiting?: No Has the patient recently lost weight without trying?: 0 Has the patient been eating poorly because of a decreased appetite?: 0 Malnutrition Screening Tool Score: 0    Physical Exam ROS  Blood pressure 117/62, pulse 78, temperature 98.4 F (36.9 C), temperature source Oral, resp. rate 18, SpO2 100 %. There is no height or weight on file to calculate BMI.  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 Yes  Has the patient been a risk to self in the past 6 months? No .    Has the patient been a risk to self within the distant past? Yes   Is the patient a risk to others? No   Has the patient been a risk to others in the past 6 months? No   Has the patient been a risk to others within the distant past? 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 [place], [place] [address]   WBC 09/13/2022 9.8  4.0 - 10.5 K/uL Final   RBC 09/13/2022 4.25  4.22 - 5.81 MIL/uL Final   Hemoglobin 09/13/2022 12.4 (L)  13.0 - 17.0 g/dL Final   HCT 09/13/2022 38.3 (L)  39.0 - 52.0 % Final   MCV 09/13/2022 90.1  80.0 - 100.0 fL Final   MCH 09/13/2022 29.2  26.0 - 34.0 pg Final   MCHC 09/13/2022 32.4  30.0 - 36.0 g/dL Final   RDW 09/13/2022 12.8  11.5 - 15.5 % Final   Platelets 09/13/2022 283  150 - 400 K/uL Final   nRBC 09/13/2022 0.0  0.0 - 0.2 %  Final   Neutrophils Relative % 09/13/2022 69  % Final   Neutro Abs 09/13/2022 6.7  1.7 - 7.7 K/uL Final   Lymphocytes Relative 09/13/2022 24  % Final   Lymphs Abs 09/13/2022 2.3  0.7 - 4.0 K/uL Final   Monocytes Relative 09/13/2022 6  % Final   Monocytes Absolute 09/13/2022 0.6  0.1 - 1.0 K/uL Final   Eosinophils Relative 09/13/2022 1  % Final   Eosinophils Absolute 09/13/2022 0.1  0.0 - 0.5 K/uL Final   Basophils Relative 09/13/2022 0  % Final   Basophils Absolute 09/13/2022 0.0  0.0 - 0.1 K/uL Final   Immature Granulocytes 09/13/2022 0  % Final   Abs Immature Granulocytes 09/13/2022 0.04  0.00 - 0.07 K/uL Final   Performed at [facility], [place] [address] [place], [place] [address]   Sodium 09/13/2022 138  135 - 145 mmol/L Final   Potassium 09/13/2022 3.8  3.5 - 5.1 mmol/L Final   Chloride 09/13/2022 103  98 - 111 mmol/L Final    CO2 09/13/2022 24  22 - 32 mmol/L Final   Glucose, Bld 09/13/2022 79  70 - 99 mg/dL Final   Glucose reference range applies only to samples taken after fasting for at least 8 hours.   BUN 09/13/2022 6  6 - 20 mg/dL Final   Creatinine, Ser 09/13/2022 0.74  0.61 - 1.24 mg/dL Final   Calcium 09/13/2022 9.7  8.9 - 10.3 mg/dL Final   Total Protein 09/13/2022 6.5  6.5 - 8.1 g/dL Final   Albumin 09/13/2022 4.0  3.5 - 5.0 g/dL Final   AST 09/13/2022 16  15 - 41 U/L Final   ALT 09/13/2022 12  0 - 44 U/L Final   Alkaline Phosphatase 09/13/2022 59  38 - 126 U/L Final   Total Bilirubin 09/13/2022 0.9  0.3 - 1.2 mg/dL Final   GFR, Estimated 09/13/2022 >60  >60 mL/min Final   Comment: (NOTE) Calculated using the CKD-EPI Creatinine Equation (2021)    Anion gap 09/13/2022 11  5 - 15 Final   Performed at [place] [address], [place], [place] [address]   Alcohol, Ethyl (B) 09/13/2022 38 (H)  <10 mg/dL Final   Comment: (NOTE) Lowest detectable limit for serum alcohol is 10 mg/dL.  For medical purposes only. Performed at [facility], [place] [address] [place], [place] [address]    Cholesterol 09/13/2022 151  0 - 200 mg/dL Final   Triglycerides 09/13/2022 116  <150 mg/dL Final   HDL 09/13/2022 63  >40 mg/dL Final   Total CHOL/HDL Ratio 09/13/2022 2.4  RATIO Final   VLDL 09/13/2022 23  0 - 40 mg/dL Final   LDL Cholesterol 09/13/2022 65  0 - 99 mg/dL Final   Comment:        Total Cholesterol/HDL:CHD Risk Coronary Heart Disease Risk Table                     Men   Women  1/2 Average Risk   3.4   3.3  Average Risk       5.0   4.4  2 X Average Risk   9.6   7.1  3 X Average Risk  23.4   11.0        Use the calculated Patient Ratio above and the CHD Risk Table to determine the patient's CHD Risk.        ATP III CLASSIFICATION (LDL):  <100  mg/dL   Optimal  100-129  mg/dL   Near or Above                    Optimal  130-159  mg/dL   Borderline  160-189  mg/dL   High  >190      mg/dL   Very High Performed at [place] [address], [place], [place] [address]    TSH 09/13/2022 0.828  0.350 - 4.500 uIU/mL Final   Comment: Performed by a 3rd Generation assay with a functional sensitivity of <=0.01 uIU/mL. Performed at [facility], [place] [address], [place], [place] [address]    POC Amphetamine UR 09/13/2022 None Detected  NONE DETECTED (Cut Off Level 1000 ng/mL) Final   POC Secobarbital (BAR) 09/13/2022 None Detected  NONE DETECTED (Cut Off Level 300 ng/mL) Final   POC Buprenorphine (BUP) 09/13/2022 None Detected  NONE DETECTED (Cut Off Level 10 ng/mL) Final   POC Oxazepam (BZO) 09/13/2022 None Detected  NONE DETECTED (Cut Off Level 300 ng/mL) Final   POC Cocaine UR 09/13/2022 None Detected  NONE DETECTED (Cut Off Level 300 ng/mL) Final   POC Methamphetamine UR 09/13/2022 None Detected  NONE DETECTED (Cut Off Level 1000 ng/mL) Final   POC Morphine 09/13/2022 None Detected  NONE DETECTED (Cut Off Level 300 ng/mL) Final   POC Methadone UR 09/13/2022 None Detected  NONE DETECTED (Cut Off Level 300 ng/mL) Final   POC Oxycodone UR 09/13/2022 None Detected  NONE DETECTED (Cut Off Level 100 ng/mL) Final   POC Marijuana UR 09/13/2022 None Detected  NONE DETECTED (Cut Off Level 50 ng/mL) Final   Valproic Acid Lvl 09/13/2022 <10 (L)  50.0 - 100.0 ug/mL Final   Comment: RESULT CONFIRMED BY MANUAL DILUTION Performed at [facility], [place] [address], [place], [place] [address]    SARSCOV2ONAVIRUS 2 AG 09/13/2022 NEGATIVE  NEGATIVE Final   Comment: (NOTE) SARS-CoV-2 antigen NOT DETECTED.   Negative results are presumptive.  Negative results do not preclude SARS-CoV-2 infection and should not be used as the sole basis for treatment or other patient management decisions, including infection  control decisions, particularly in the presence of clinical signs and  symptoms consistent with COVID-19, or in those who have been in contact with the virus.   Negative results must be combined with clinical observations, patient history, and epidemiological information. The expected result is Negative.  Fact Sheet for Patients: HandmadeRecipes.com.cy  Fact Sheet for Healthcare Providers: FuneralLife.at  This test is not yet approved or cleared by the [place] FDA and  has been authorized for detection and/or diagnosis of SARS-CoV-2 by FDA under an Emergency Use Authorization (EUA).  This EUA will remain in effect (meaning this test can be used) for the duration of  the COV                          ID-19 declaration under Section 564(b)(1) of the Act, 21 U.S.C. section 360bbb-3(b)(1), unless the authorization is terminated or revoked sooner.    Admission on 04/03/2022, Discharged on 04/04/2022  Component Date Value Ref Range Status   Sodium 04/03/2022 138  135 - 145 mmol/L Final   Potassium 04/03/2022 3.9  3.5 - 5.1 mmol/L Final   Chloride 04/03/2022 105  98 - 111 mmol/L Final   CO2 04/03/2022 28  22 - 32 mmol/L Final   Glucose, Bld 04/03/2022 114 (H)  70 - 99 mg/dL Final  Glucose reference range applies only to samples taken after fasting for at least 8 hours.   BUN 04/03/2022 10  6 - 20 mg/dL Final   Creatinine, Ser 04/03/2022 0.85  0.61 - 1.24 mg/dL Final   Calcium 04/03/2022 9.4  8.9 - 10.3 mg/dL Final   GFR, Estimated 04/03/2022 >60  >60 mL/min Final   Comment: (NOTE) Calculated using the CKD-EPI Creatinine Equation (2021)    Anion gap 04/03/2022 5  5 - 15 Final   Performed at [facility], [place] [address] [place], [place] [address]   WBC 04/03/2022 6.5  4.0 - 10.5 K/uL Final   RBC 04/03/2022 4.59  4.22 - 5.81 MIL/uL Final   Hemoglobin 04/03/2022 13.3  13.0 - 17.0 g/dL Final   HCT 04/03/2022 41.2  39.0 - 52.0 % Final   MCV 04/03/2022 89.8  80.0 - 100.0 fL Final   MCH 04/03/2022 29.0  26.0 - 34.0 pg Final   MCHC 04/03/2022 32.3  30.0 - 36.0 g/dL Final   RDW  04/03/2022 12.7  11.5 - 15.5 % Final   Platelets 04/03/2022 218  150 - 400 K/uL Final   nRBC 04/03/2022 0.0  0.0 - 0.2 % Final   Performed at [facility], [place] [address] [place], [place] [address]   Troponin I (High Sensitivity) 04/03/2022 2  <18 ng/L Final   Comment: (NOTE) Elevated high sensitivity troponin I (hsTnI) values and significant  changes across serial measurements may suggest ACS but many other  chronic and acute conditions are known to elevate hsTnI results.  Refer to the "Links" section for chest pain algorithms and additional  guidance. Performed at [facility], [place] [address] [place], [place] [address]    Troponin I (High Sensitivity) 04/03/2022 2  <18 ng/L Final   Comment: (NOTE) Elevated high sensitivity troponin I (hsTnI) values and significant  changes across serial measurements may suggest ACS but many other  chronic and acute conditions are known to elevate hsTnI results.  Refer to the "Links" section for chest pain algorithms and additional  guidance. Performed at [facility], [place] [address] [place], [place] [address]     Allergies: Penicillins  Medications:  Facility Ordered Medications  Medication   acetaminophen (TYLENOL) tablet 650 mg   alum & mag hydroxide-simeth (MAALOX/MYLANTA) 200-200-20 MG/5ML suspension 30 mL   magnesium hydroxide (MILK OF MAGNESIA) suspension 30 mL   traZODone (DESYREL) tablet 50 mg   hydrOXYzine (ATARAX) tablet 25 mg   PTA Medications  Medication Sig   benztropine (COGENTIN) 2 MG tablet Take 2 mg by mouth at bedtime.   traZODone (DESYREL) 100 MG tablet Take 100 mg by mouth at bedtime.   haloperidol decanoate (HALDOL DECANOATE) 100 MG/ML injection Inject 100 mg into the muscle every 28 (twenty-eight) days.    Medical Decision Making  Patient will be admitted to [facility] for continuous assessment while with follow-up by psychiatry on  09/13/2021.  Recommendations  Based on my evaluation the patient does not appear to have an emergency medical condition.  Ophelia Shoulder, NP 09/14/22  7:21 AM
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
## 2022-09-13 NOTE — ED Triage Notes (Signed)
Pt presents to [facility] voluntarily accompanied by his cousin. Pt reports he has an Research officer, political party through [employer]. Pt states "I'm intoxicated". When asked how we could be of assistance today pt states " I have something in my body that wants to eat me". Pt has slurred speech, unkempt and glossy eyes. Pt reports consuming 2, 12oz beers and marijuana "1 blunt" 2 hours ago. Pt denies SI/HI and AVH.

## 2022-09-13 NOTE — ED Notes (Signed)
Pt is a 32 year old male who presents unaccompanied to [facility] after being dropped off by his cousin. Per medical record, Pt has a diagnosis of schizoaffective disorder, bipolar type and receives medication management and ACTT services through [facility]. Pt says he was at a reunion for the birthday of someone who died and "the shock came back." He says he "has something in my body that wants to eat me." He also says there is a person in his head that is talking to him, telling him to "get out of here" and "get some help." He describes his mood as depressed and sad. Pt acknowledges symptoms including crying spells, social withdrawal, loss of interest in usual pleasures, fatigue, irritability, decreased concentration, decreased sleep, and feelings of worthlessness and hopelessness. He acknowledges suicidal thoughts with no plan or intent to harm himself. He denies history of suicide attempts.Patient A&Ox4. Patient denies SI/Hi and AVH. Denies any physical complaints when asked. No acute distress noted. Support and encouragement provided. Routine safety checks conducted according to facility protocol. Encouraged patient to notify staff if thoughts of harm toward self or others arise. Patient verbalize understanding and agreement. Will continue to monitor for safety.

## 2022-09-14 LAB — COMPREHENSIVE METABOLIC PANEL
ALT: 12 U/L (ref 0–44)
AST: 16 U/L (ref 15–41)
Albumin: 4 g/dL (ref 3.5–5.0)
Alkaline Phosphatase: 59 U/L (ref 38–126)
Anion gap: 11 (ref 5–15)
BUN: 6 mg/dL (ref 6–20)
CO2: 24 mmol/L (ref 22–32)
Calcium: 9.7 mg/dL (ref 8.9–10.3)
Chloride: 103 mmol/L (ref 98–111)
Creatinine, Ser: 0.74 mg/dL (ref 0.61–1.24)
GFR, Estimated: >60 mL/min (ref >60)
Glucose, Bld: 79 mg/dL (ref 70–99)
Potassium: 3.8 mmol/L (ref 3.5–5.1)
Sodium: 138 mmol/L (ref 135–145)
Total Bilirubin: 0.9 mg/dL (ref 0.3–1.2)
Total Protein: 6.5 g/dL (ref 6.5–8.1)

## 2022-09-14 LAB — CBC WITH DIFFERENTIAL/PLATELET
Abs Immature Granulocytes: 0.04 10*3/uL (ref 0.00–0.07)
Basophils Absolute: 0 10*3/uL (ref 0.0–0.1)
Basophils Relative: 0 %
Eosinophils Absolute: 0.1 10*3/uL (ref 0.0–0.5)
Eosinophils Relative: 1 %
HCT: 38.3 % — ABNORMAL LOW (ref 39.0–52.0)
Hemoglobin: 12.4 g/dL — ABNORMAL LOW (ref 13.0–17.0)
Immature Granulocytes: 0 %
Lymphocytes Relative: 24 %
Lymphs Abs: 2.3 10*3/uL (ref 0.7–4.0)
MCH: 29.2 pg (ref 26.0–34.0)
MCHC: 32.4 g/dL (ref 30.0–36.0)
MCV: 90.1 fL (ref 80.0–100.0)
Monocytes Absolute: 0.6 10*3/uL (ref 0.1–1.0)
Monocytes Relative: 6 %
Neutro Abs: 6.7 10*3/uL (ref 1.7–7.7)
Neutrophils Relative %: 69 %
Platelets: 283 10*3/uL (ref 150–400)
RBC: 4.25 MIL/uL (ref 4.22–5.81)
RDW: 12.8 % (ref 11.5–15.5)
WBC: 9.8 10*3/uL (ref 4.0–10.5)
nRBC: 0 % (ref 0.0–0.2)

## 2022-09-14 LAB — ETHANOL: Alcohol, Ethyl (B): 38 mg/dL — ABNORMAL HIGH (ref <10)

## 2022-09-14 LAB — HEMOGLOBIN A1C
Hgb A1c MFr Bld: 4.6 % — ABNORMAL LOW (ref 4.8–5.6)
Mean Plasma Glucose: 85 mg/dL

## 2022-09-14 LAB — TSH: TSH: 0.828 u[IU]/mL (ref 0.350–4.500)

## 2022-09-14 LAB — LIPID PANEL
Cholesterol: 151 mg/dL (ref 0–200)
HDL: 63 mg/dL (ref >40)
LDL Cholesterol: 65 mg/dL (ref 0–99)
Total CHOL/HDL Ratio: 2.4 RATIO
Triglycerides: 116 mg/dL (ref <150)
VLDL: 23 mg/dL (ref 0–40)

## 2022-09-14 LAB — SARS CORONAVIRUS 2 BY RT PCR: SARS Coronavirus 2 by RT PCR: NEGATIVE

## 2022-09-14 LAB — VALPROIC ACID LEVEL: Valproic Acid Lvl: <10 ug/mL — ABNORMAL LOW (ref 50.0–100.0)

## 2022-09-14 MED ORDER — OLANZAPINE 5 MG PO TBDP: 5.0000 mg | ORAL_TABLET | Freq: Three times a day (TID) | ORAL | Status: DC | PRN

## 2022-09-14 NOTE — Discharge Instructions (Signed)
Transfer to [facility] for IP admission  

## 2022-09-14 NOTE — Progress Notes (Signed)
LCSW Progress Note  [id number]   KENAAN RUETER  09/14/2022  3:14 PM  Description:   Inpatient Psychiatric Referral  Patient was recommended inpatient per Thomes Lolling, NP. There are no available beds at [facility]. Patient was referred to the following facilities:   Destination  Service Provider Address Phone Fax  [facility]  [address], [place] [address] [phone]  [place], [place] [phone]  [facility]  [place], [place] [address] [phone] [place] [address] [phone]  [facility]  [place] [place] [place] [address] [phone] [facility]  [address] [place], [place] [address] [phone] [place] [place] [address] [place]  [facility]  [address], [place] [address] [phone]  [place] [address] [place] [address] [phone]  [facility]  [address] [place] [address] [phone]  [facility]  [address] [place] [address] [phone] [facility]  [address] [place] [address] [phone] [facility]  [address] [place] [address] [place]  [address] [place] [address] [place]  [facility]  [address] [place] [address] [phone]  [place]  [place] [place] [address] [phone]   [facility]  [address] [place] [address] [phone] [facility]  [place], [place] [address] [phone]  [facility] [address] [place] [address] [phone]  [facility]  [place], [place] [address] [phone]         [facility]  [address] [place]  [address] [phone]    Situation ongoing, CSW to continue following and update chart as more information becomes available.      Denna Haggard, [place]  09/14/2022 3:14 PM

## 2022-09-14 NOTE — Progress Notes (Signed)
Pt was accepted to [facility] 09/14/2022. Bed assignment: [place] [facility]  Pt meets inpatient criteria per Thomes Lolling, NP  Attending Physician will be Camie Patience, MD  Report can be called to: [phone] or [phone] (back up)  Bed is ready now  Care Team Notified: Lynnda Shields, RN, Thomes Lolling, NP, and Luanna Salk, RN  [place], [place]  09/14/2022 3:34 PM

## 2022-09-14 NOTE — ED Notes (Signed)
Discharged to [facility], being transported via safe transport. All belongings sent with patient.

## 2022-09-14 NOTE — ED Provider Notes (Cosign Needed Addendum)
FBC/OBS ASAP Discharge Summary  Date and Time: 09/14/2022 3:37 PM  Name: Cody [name]  MRN:  [place]:[id number]   Discharge Diagnoses:  Final diagnoses:  Schizoaffective disorder, bipolar type [facility])   HPI: Cody [name] is a 32 year old male with psychiatric history of schizoaffective disorder, bipolar type, cannabis abuse, and cocaine abuse.  Patient presented voluntarily to [place] with chief complaint of "my head is bothering me, there is something in my head."   [name], 32 y.o., male patient seen face to face by this provider, consulted with Dr. Dwyane Dee; and chart reviewed on 09/14/22.  Patient receives medication management through [facility] ACT Team [phone] after hours [phone]. Per ACT Team member, Olivia Mackie patient is on Haldol Decanoate 100mg /ml Injection Q4weeks. Last injection on 08/25/2022.    UDS on admission was negative and EtOH was 38.  Per chart review as noted by Leandro Reasoner NP "Collateral was obtained upon admission from patient's brother-Alexander says he has been experiencing auditory hallucination and saying he wants to hurt himself and others. He also report patient has been acting strangely. He says [facility] paid for patient to stay in a hotel but patient gave up his room to unknown male. He says patient is conceived that rapper/celebrity Dayton Scrape is his girlfriend and he is also her Freight forwarder. He report that patient was unable to recognize his daughter today".     Subjective:   On today's evaluation Cody [name] is observed laying in his bed asleep.  He is easily awakened.  He is alert/oriented x 4, cooperative, and fairly attentive.  He is bizarre.  He is disheveled and makes fair eye contact.  His speech is clear, coherent, at a normal rate and tone. He endorses depression and anxiety.  Reports he has had a decrease in sleep for few weeks now.  He continues to endorse intrusive auditory hallucinations but states they are improved this  morning.  He denies SI/HI.  He denies access to firearms/weapons.  He does not appear to be responding to internal/external stimuli.   Patient does not feel comfortable being discharged at this time.  He feels that he would be best served if he was hospitalized to get his voices under control, because he is unable to function.  Stay Summary:   Patient has been accepted to [facility] for IP admission. Contacted ACTT team at patients request to let them know he will be at [facility].  Staff  states patient has not been stable for quite a while.They are in agreement that pt needs IP treatment.   Total Time spent with patient: 30 minutes  Past Psychiatric History: as documented in H&P Past Medical History:  as documented in H&P Family History:  as documented in H&P Family Psychiatric History:  as documented in H&P Social History:  as documented in H&P Tobacco Cessation:  N/A, patient does not currently use tobacco products  Current Medications:  Current Facility-Administered Medications  Medication Dose Route Frequency Provider Last Rate Last Admin   acetaminophen (TYLENOL) tablet 650 mg  650 mg Oral Q6H PRN Ajibola, Ene A, NP   650 mg at 09/13/22 2155   alum & mag hydroxide-simeth (MAALOX/MYLANTA) 200-200-20 MG/5ML suspension 30 mL  30 mL Oral Q4H PRN Ajibola, Ene A, NP       hydrOXYzine (ATARAX) tablet 25 mg  25 mg Oral TID PRN Ajibola, Ene A, NP       magnesium hydroxide (MILK OF MAGNESIA) suspension 30  mL  30 mL Oral Daily PRN Ajibola, Ene A, NP       OLANZapine zydis (ZYPREXA) disintegrating tablet 5 mg  5 mg Oral Q8H PRN Ajibola, Ene A, NP       traZODone (DESYREL) tablet 50 mg  50 mg Oral QHS PRN Ajibola, Ene A, NP   50 mg at 09/13/22 2155   Current Outpatient Medications  Medication Sig Dispense Refill   benztropine (COGENTIN) 2 MG tablet Take 2 mg by mouth at bedtime.     haloperidol decanoate (HALDOL DECANOATE) 100 MG/ML injection Inject 100 mg into the muscle every 28  (twenty-eight) days.     neomycin-bacitracin-polymyxin (NEOSPORIN) OINT Apply 1 Application topically daily as needed for wound care or irritation.     tetrahydrozoline 0.05 % ophthalmic solution Place 2 drops into both eyes 4 (four) times daily as needed (For eye irritation).     traZODone (DESYREL) 100 MG tablet Take 100 mg by mouth at bedtime.      PTA Medications:  Facility Ordered Medications  Medication   acetaminophen (TYLENOL) tablet 650 mg   alum & mag hydroxide-simeth (MAALOX/MYLANTA) 200-200-20 MG/5ML suspension 30 mL   magnesium hydroxide (MILK OF MAGNESIA) suspension 30 mL   traZODone (DESYREL) tablet 50 mg   hydrOXYzine (ATARAX) tablet 25 mg   OLANZapine zydis (ZYPREXA) disintegrating tablet 5 mg   PTA Medications  Medication Sig   traZODone (DESYREL) 100 MG tablet Take 100 mg by mouth at bedtime.   haloperidol decanoate (HALDOL DECANOATE) 100 MG/ML injection Inject 100 mg into the muscle every 28 (twenty-eight) days.   benztropine (COGENTIN) 2 MG tablet Take 2 mg by mouth at bedtime.   tetrahydrozoline 0.05 % ophthalmic solution Place 2 drops into both eyes 4 (four) times daily as needed (For eye irritation).   neomycin-bacitracin-polymyxin (NEOSPORIN) OINT Apply 1 Application topically daily as needed for wound care or irritation.       03/05/2022    5:45 PM 01/13/2022    9:32 PM 05/22/2021    5:37 PM  Depression screen PHQ 2/9  Decreased Interest  0 1  Down, Depressed, Hopeless 2 1 2   PHQ - 2 Score 2 1 3   Altered sleeping 2 2 2   Tired, decreased energy 0 1 1  Change in appetite 0 0 0  Feeling bad or failure about yourself  1 1 2   Trouble concentrating 2 1 1   Moving slowly or fidgety/restless 2 0 2  Suicidal thoughts 0 0 2  PHQ-9 Score 9 6 13   Difficult doing work/chores Somewhat difficult Very difficult Very difficult    Flowsheet Row ED from 09/13/2022 in [facility] ED from 04/04/2022 in [facility] Emergency Department at [facility] ED from 04/03/2022 in [facility] Emergency Department at [place] No Risk No Risk No Risk       Musculoskeletal  Strength & Muscle Tone: within normal limits Gait & Station: normal Patient leans: N/A  Psychiatric Specialty Exam  Presentation  General Appearance:  Bizarre  Eye Contact: Fair  Speech: Clear and Coherent; Normal Rate  Speech Volume: Normal  Handedness: Right   Mood and Affect  Mood: Depressed  Affect: Congruent   Thought Process  Thought Processes: Coherent  Descriptions of Associations:Intact  Orientation:Full (Time, Place and Person)  Thought Content:Paranoid Ideation  Diagnosis of Schizophrenia or Schizoaffective disorder in past: Yes  Duration of Psychotic Symptoms: Greater than six months   Hallucinations:Hallucinations: None; Auditory Description  of Auditory Hallucinations: voices have gotten more quiet  Ideas of Reference:Paranoia  Suicidal Thoughts:Suicidal Thoughts: No  Homicidal Thoughts:Homicidal Thoughts: No   Sensorium  Memory: Immediate Good; Recent Good; Remote Good  Judgment: Fair  Insight: Fair   Materials engineer: Fair  Attention Span: Fair  Recall: [employer] of Knowledge: Fair  Language: Fair   Psychomotor Activity  Psychomotor Activity: Psychomotor Activity: Normal   Assets  Assets: Physical Health; Resilience; Social Support   Sleep  Sleep: Sleep: Fair Number of Hours of Sleep: 5   Nutritional Assessment (For OBS and FBC admissions only) Has the patient had a weight loss or gain of 10 pounds or more in the last 3 months?: No Has the patient had a decrease in food intake/or appetite?: No Does the patient have dental problems?: No Does the patient have eating habits or behaviors that may be indicators of an eating disorder including binging or inducing vomiting?: No Has the patient recently lost weight without  trying?: 0 Has the patient been eating poorly because of a decreased appetite?: 0 Malnutrition Screening Tool Score: 0    Physical Exam  Physical Exam Vitals and nursing note reviewed.  Constitutional:      General: He is not in acute distress.    Appearance: Normal appearance. He is well-developed.  HENT:     Head: Normocephalic.  Eyes:     General:        Right eye: No discharge.        Left eye: No discharge.     Conjunctiva/sclera: Conjunctivae normal.  Cardiovascular:     Rate and Rhythm: Normal rate.  Pulmonary:     Effort: Pulmonary effort is normal. No respiratory distress.  Musculoskeletal:        General: Normal range of motion.     Cervical back: Normal range of motion.  Skin:    Coloration: Skin is not jaundiced or pale.  Neurological:     Mental Status: He is alert and oriented to person, place, and time.  Psychiatric:        Mood and Affect: Mood normal.    Review of Systems  Constitutional: Negative.   HENT: Negative.    Eyes: Negative.   Respiratory: Negative.    Cardiovascular: Negative.   Musculoskeletal: Negative.   Skin: Negative.   Neurological: Negative.   Psychiatric/Behavioral:  Positive for depression, hallucinations and substance abuse. The patient is nervous/anxious.    Blood pressure 117/62, pulse 78, temperature 98.4 F (36.9 C), temperature source Oral, resp. rate 18, SpO2 100 %. There is no height or weight on file to calculate BMI.   Disposition:   Patient meets inpatient psychiatric admission.  He has been accepted to [place], NP 09/14/2022, 3:37 PM

## 2022-09-14 NOTE — ED Notes (Signed)
Patient has been cooperative but bizarre this shift. Pt has been redirected several times to step back from the window and to accept boundaries and personal space. Denies SI/HI this shift, appears to be responding to internal stimuli.

## 2022-09-15 LAB — PROLACTIN: Prolactin: 50.9 ng/mL — ABNORMAL HIGH (ref 3.6–31.5)
